# Patient Record
Sex: Female | Born: 1950 | Hispanic: No | Marital: Married | Smoking: Never smoker
Health system: Southern US, Community
[De-identification: ages and names within clinical notes are randomized; demographics above are authoritative.]

## PROBLEM LIST (undated history)

## (undated) DIAGNOSIS — K219 Gastro-esophageal reflux disease without esophagitis: Secondary | ICD-10-CM

## (undated) DIAGNOSIS — I10 Essential (primary) hypertension: Secondary | ICD-10-CM

## (undated) DIAGNOSIS — E785 Hyperlipidemia, unspecified: Secondary | ICD-10-CM

## (undated) DIAGNOSIS — M1990 Unspecified osteoarthritis, unspecified site: Secondary | ICD-10-CM

## (undated) DIAGNOSIS — E119 Type 2 diabetes mellitus without complications: Secondary | ICD-10-CM

## (undated) DIAGNOSIS — N189 Chronic kidney disease, unspecified: Secondary | ICD-10-CM

## (undated) DIAGNOSIS — T7840XA Allergy, unspecified, initial encounter: Secondary | ICD-10-CM

## (undated) HISTORY — PX: COLONOSCOPY: SHX174

## (undated) HISTORY — DX: Unspecified osteoarthritis, unspecified site: M19.90

## (undated) HISTORY — PX: MEDIAL PARTIAL KNEE REPLACEMENT: SHX5965

## (undated) HISTORY — DX: Allergy, unspecified, initial encounter: T78.40XA

## (undated) HISTORY — PX: TRIGGER FINGER RELEASE: SHX641

## (undated) HISTORY — PX: APPENDECTOMY: SHX54

## (undated) HISTORY — DX: Gastro-esophageal reflux disease without esophagitis: K21.9

## (undated) HISTORY — DX: Chronic kidney disease, unspecified: N18.9

---

## 2014-10-02 ENCOUNTER — Emergency Department (HOSPITAL_COMMUNITY): Payer: BC Managed Care – PPO

## 2014-10-02 ENCOUNTER — Encounter (HOSPITAL_COMMUNITY): Payer: Self-pay | Admitting: Emergency Medicine

## 2014-10-02 ENCOUNTER — Emergency Department (HOSPITAL_COMMUNITY)
Admission: EM | Admit: 2014-10-02 | Discharge: 2014-10-02 | Disposition: A | Discharge status: Home / Self Care | Payer: BC Managed Care – PPO | Attending: Emergency Medicine | Admitting: Emergency Medicine

## 2014-10-02 DIAGNOSIS — X58XXXA Exposure to other specified factors, initial encounter: Secondary | ICD-10-CM | POA: Diagnosis not present

## 2014-10-02 DIAGNOSIS — I10 Essential (primary) hypertension: Secondary | ICD-10-CM | POA: Insufficient documentation

## 2014-10-02 DIAGNOSIS — M25562 Pain in left knee: Secondary | ICD-10-CM

## 2014-10-02 DIAGNOSIS — Y93K1 Activity, walking an animal: Secondary | ICD-10-CM | POA: Diagnosis not present

## 2014-10-02 DIAGNOSIS — M7122 Synovial cyst of popliteal space [Baker], left knee: Secondary | ICD-10-CM | POA: Diagnosis not present

## 2014-10-02 DIAGNOSIS — R52 Pain, unspecified: Secondary | ICD-10-CM

## 2014-10-02 DIAGNOSIS — S8992XA Unspecified injury of left lower leg, initial encounter: Secondary | ICD-10-CM | POA: Insufficient documentation

## 2014-10-02 DIAGNOSIS — Y929 Unspecified place or not applicable: Secondary | ICD-10-CM | POA: Diagnosis not present

## 2014-10-02 DIAGNOSIS — E119 Type 2 diabetes mellitus without complications: Secondary | ICD-10-CM | POA: Insufficient documentation

## 2014-10-02 HISTORY — DX: Essential (primary) hypertension: I10

## 2014-10-02 HISTORY — DX: Hyperlipidemia, unspecified: E78.5

## 2014-10-02 HISTORY — DX: Type 2 diabetes mellitus without complications: E11.9

## 2014-10-02 MED ORDER — ONDANSETRON 4 MG PO TBDP: 4.0000 mg | ORAL_TABLET | Freq: Once | ORAL | Status: DC

## 2014-10-02 MED ORDER — ONDANSETRON HCL 4 MG PO TABS: 4.0000 mg | ORAL_TABLET | Freq: Four times a day (QID) | ORAL | Status: DC

## 2014-10-02 MED ORDER — HYDROCODONE-ACETAMINOPHEN 5-325 MG PO TABS: 1.0000 | ORAL_TABLET | Freq: Four times a day (QID) | ORAL | Status: DC | PRN

## 2014-10-02 NOTE — ED Notes (Signed)
PT given an RX for cane

## 2014-10-02 NOTE — ED Provider Notes (Signed)
CSN: [id number]     Arrival date & time 10/02/14  1052 History  This chart was scribed for a non-physician practitioner, Jinny SandersJoseph Aadarsh Cozort, PA-C working with Linwood DibblesJon Knapp, MD by [place] Peace, ED Scribe. The patient was seen in TR07C/TR07C. The patient's care was started at 12:55 PM.    Chief Complaint  Patient presents with  . Leg Pain      Patient is a 63 y.o. female presenting with leg pain. The history is provided by the patient. No language interpreter was used.  Leg Pain Associated symptoms: no fever   HPI Comments: Wanda Medina is a 63 y.o. female who presents to the Emergency Department complaining of left leg pain behind left knee since last week. Pt reports she is experiencing pain when she puts pressure on affected leg. She states that she was walking her dog last night when she felt a "pop" and pain has progressively worsened since. She denies any injury that could be responsible. Daughters state that pt usually wears a knee brace on left knee just for precaution due to her being a frequent walker but did not wear one yesterday. Pt has history of osteoarthritis in both knees and is followed by orthopedics for same. Pt reports history of DM.   Past Medical History  Diagnosis Date  . Diabetes mellitus without complication   . Hypertension   . Hyperlipemia    History reviewed. No pertinent past surgical history. History reviewed. No pertinent family history. History  Substance Use Topics  . Smoking status: Never Smoker   . Smokeless tobacco: Not on file  . Alcohol Use: No   OB History   Grav Para Term Preterm Abortions TAB SAB Ect Mult Living                 Review of Systems  Constitutional: Negative for fever.  Gastrointestinal: Negative for nausea and vomiting.  Musculoskeletal: Positive for arthralgias.       Left knee pain.   Neurological: Negative for weakness and numbness.      Allergies  Review of patient's allergies indicates no known allergies.  Home  Medications   Prior to Admission medications   Medication Sig Start Date End Date Taking? Authorizing Provider  HYDROcodone-acetaminophen (NORCO/VICODIN) 5-325 MG per tablet Take 1-2 tablets by mouth every 6 (six) hours as needed for moderate pain or severe pain. 10/02/14   Monte FantasiaJoseph W Karely Hurtado, PA-C  ondansetron (ZOFRAN) 4 MG tablet Take 1 tablet (4 mg total) by mouth every 6 (six) hours. 10/02/14   Monte FantasiaJoseph W Xyla Leisner, PA-C   BP 145/85  Pulse 75  Temp(Src) 98.1 F (36.7 C) (Oral)  Resp 16  Ht 5' (1.524 m)  Wt 135 lb (61.236 kg)  BMI 26.37 kg/m2  SpO2 100% Physical Exam  Nursing note and vitals reviewed. Constitutional: She is oriented to person, place, and time. She appears well-developed and well-nourished. No distress.  HENT:  Head: Normocephalic and atraumatic.  Eyes: Conjunctivae and EOM are normal.  Neck: Neck supple. No tracheal deviation present.  Cardiovascular: Normal rate.   Pulmonary/Chest: Effort normal. No respiratory distress.  Musculoskeletal: Normal range of motion.  Limited range of motion of left knee due to pain. No obvious erythema, warmth, deformity, ecchymosis, signs of injury or infection. Mild effusion of left knee. No anterior, posterior, or lateral instability. Positive McMurray's sign with varus manipulation.moderate amount of soft, fluctuant, slightly reducible cystic mass in patient's posterior left knee.   Neurological: She is alert and oriented to person, place,  and time. She has normal strength. No sensory deficit. She displays a negative Romberg sign. GCS eye subscore is 4. GCS verbal subscore is 5. GCS motor subscore is 6.  5 out of 5 motor strength noted in all major muscle groups of upper and lower extremities. Distal sensation intact.  Skin: Skin is warm and dry.  Psychiatric: She has a normal mood and affect. Her behavior is normal.    ED Course  Procedures (including critical care time) Labs Review Labs Reviewed - No data to display  No results  found for this or any previous visit. No results found.   Imaging Review Dg Knee Complete 4 Views Left  10/02/2014   CLINICAL DATA:  Patient complaining of left knee pain in the posterior knee for 4 days, worsening today. Pain limits patient's ability to walk. No known injury. No history of surgery.  EXAM: LEFT KNEE - COMPLETE 4+ VIEW  COMPARISON:  None.  FINDINGS: No fracture. Knee joint is normally aligned. There is minor marginal spurring from medial compartment. Small marginal spurs are noted from the patella.  There is a small irregular ossified or calcified density the posterior aspect of the knee below the level of a fabella. This is consistent with an intra-articular body.  No convincing joint effusion. Soft tissues are otherwise unremarkable.  IMPRESSION: 1. No fracture or acute finding. 2. Small posterior intra-articular ossified or calcified body. 3. Mild degenerative changes of the medial and patellofemoral joint space compartments.   Electronically Signed   By: Amie Portlandavid  Ormond M.D.   On: 10/02/2014 12:09     EKG Interpretation None     Medications - No data to display  1:00 PM- Treatment plan was discussed with patient who verbalizes understanding and agrees.   MDM   Final diagnoses:  Left knee pain  Baker cyst, left    63 year old female with history of osteoarthritis in her knees here complaining of knee pain since last week.based on patient's PE and history, her pain is consistent with possible osteoarthritis of her knees.  Patient and her family state the patient does have osteoarthritis in her knees and has been followed by orthopedics for same before.  Radiographs today consistent with some mild degenerative disease.   Exam today is consistent with exacerbation of osteoarthritic pain, no obvious injuries, although patient other than a positive McMurray sign. I believe this may be due to joint line irritation/pain which may contribute to this. I am not as concerned about  a meniscal tear, however as it is a possibility. I believe the cystic mass in the patient's posterior knee is most likely a Baker's cyst. I considered placing patient in a knee immobilizer, however due to her age and her current physical condition, she and I agreed that it would be extremely difficult for her to use crutches feasibly. I therefore encouraged her to continue using her knee sleeve, and I prescribed a 4 pronged cane for her to help get weight off of her knee. I strongly encouraged patient to follow-up with Dr. Lynnae SandhoffHammen who [place]. I sent patient home with symptomatic therapy, and discussed return precautions with her and her family. Patient was agreeable to this plan. I encouraged patient to call or return to the ER should she have any questions or concerns.  BP 145/85  Pulse 75  Temp(Src) 98.1 F (36.7 C) (Oral)  Resp 16  Ht 5' (1.524 m)  Wt 135 lb (61.236 kg)  BMI 26.37 kg/m2  SpO2 100%  Signed,  Ladona Mow, PA-C 6:04 PM   I personally performed the services described in this documentation, which was scribed in my presence. The recorded information has been reviewed and is accurate.   Monte Fantasia, PA-C 10/02/14 1804  Monte Fantasia, PA-C 10/02/14 [phone]

## 2014-10-02 NOTE — ED Notes (Signed)
At time of D/C Pt . And family requested An RX for nausea meds.

## 2014-10-02 NOTE — ED Notes (Signed)
She reports pain behind L knee since last week. Denies any injuries. Last night while she was walking she heard a "pop" from the knee and the pain has been worse since. Cms intact.

## 2014-10-02 NOTE — Discharge Instructions (Signed)
Follow-up with Dr. Ronney Asters with orthopedics. Return to the ER if you develop any weakness, numbness, tingling, worsening pain or should you have any questions or concerns.  Arthritis, Nonspecific Arthritis is inflammation of a joint. This usually means pain, redness, warmth or swelling are present. One or more joints may be involved. There are a number of types of arthritis. Your caregiver may not be able to tell what type of arthritis you have right away. CAUSES  The most common cause of arthritis is the wear and tear on the joint (osteoarthritis). This causes damage to the cartilage, which can break down over time. The knees, hips, back and neck are most often affected by this type of arthritis. Other types of arthritis and common causes of joint pain include:  Sprains and other injuries near the joint. Sometimes minor sprains and injuries cause pain and swelling that develop hours later.  Rheumatoid arthritis. This affects hands, feet and knees. It usually affects both sides of your body at the same time. It is often associated with chronic ailments, fever, weight loss and general weakness.  Crystal arthritis. Gout and pseudo gout can cause occasional acute severe pain, redness and swelling in the foot, ankle, or knee.  Infectious arthritis. Bacteria can get into a joint through a break in overlying skin. This can cause infection of the joint. Bacteria and viruses can also spread through the blood and affect your joints.  Drug, infectious and allergy reactions. Sometimes joints can become mildly painful and slightly swollen with these types of illnesses. SYMPTOMS   Pain is the main symptom.  Your joint or joints can also be red, swollen and warm or hot to the touch.  You may have a fever with certain types of arthritis, or even feel overall ill.  The joint with arthritis will hurt with movement. Stiffness is present with some types of arthritis. DIAGNOSIS  Your caregiver will suspect  arthritis based on your description of your symptoms and on your exam. Testing may be needed to find the type of arthritis:  Blood and sometimes urine tests.  X-ray tests and sometimes CT or MRI scans.  Removal of fluid from the joint (arthrocentesis) is done to check for bacteria, crystals or other causes. Your caregiver (or a specialist) will numb the area over the joint with a local anesthetic, and use a needle to remove joint fluid for examination. This procedure is only minimally uncomfortable.  Even with these tests, your caregiver may not be able to tell what kind of arthritis you have. Consultation with a specialist (rheumatologist) may be helpful. TREATMENT  Your caregiver will discuss with you treatment specific to your type of arthritis. If the specific type cannot be determined, then the following general recommendations may apply. Treatment of severe joint pain includes:  Rest.  Elevation.  Anti-inflammatory medication (for example, ibuprofen) may be prescribed. Avoiding activities that cause increased pain.  Only take over-the-counter or prescription medicines for pain and discomfort as recommended by your caregiver.  Cold packs over an inflamed joint may be used for 10 to 15 minutes every hour. Hot packs sometimes feel better, but do not use overnight. Do not use hot packs if you are diabetic without your caregiver's permission.  A cortisone shot into arthritic joints may help reduce pain and swelling.  Any acute arthritis that gets worse over the next 1 to 2 days needs to be looked at to be sure there is no joint infection. Long-term arthritis treatment involves modifying activities and  lifestyle to reduce joint stress jarring. This can include weight loss. Also, exercise is needed to nourish the joint cartilage and remove waste. This helps keep the muscles around the joint strong. HOME CARE INSTRUCTIONS   Do not take aspirin to relieve pain if gout is suspected. This  elevates uric acid levels.  Only take over-the-counter or prescription medicines for pain, discomfort or fever as directed by your caregiver.  Rest the joint as much as possible.  If your joint is swollen, keep it elevated.  Use crutches if the painful joint is in your leg.  Drinking plenty of fluids may help for certain types of arthritis.  Follow your caregiver's dietary instructions.  Try low-impact exercise such as:  Swimming.  Water aerobics.  Biking.  Walking.  Morning stiffness is often relieved by a warm shower.  Put your joints through regular range-of-motion. SEEK MEDICAL CARE IF:   You do not feel better in 24 hours or are getting worse.  You have side effects to medications, or are not getting better with treatment. SEEK IMMEDIATE MEDICAL CARE IF:   You have a fever.  You develop severe joint pain, swelling or redness.  Many joints are involved and become painful and swollen.  There is severe back pain and/or leg weakness.  You have loss of bowel or bladder control. Document Released: 01/03/2005 Document Revised: 02/18/2012 Document Reviewed: 01/19/2009 [facility] Patient Information 2015 [place] [place]. This information is not intended to replace advice given to you by your health care provider. Make sure you discuss any questions you have with your health care provider.  Baker Cyst A Baker cyst is a sac-like structure that forms in the back of the knee. It is filled with the same fluid that is located in your knee. This fluid lubricates the bones and cartilage of the knee and allows them to move over each other more easily. CAUSES  When the knee becomes injured or inflamed, increased fluid forms in the knee. When this happens, the joint lining is pushed out behind the knee and forms the Baker cyst. This cyst may also be caused by inflammation from arthritic conditions and infections. SIGNS AND SYMPTOMS  A Baker cyst usually has no symptoms. When the  cyst is substantially enlarged:  You may feel pressure behind the knee, stiffness in the knee, or a mass in the area behind the knee.  You may develop pain, redness, and swelling in the calf. This can suggest a blood clot and requires evaluation by your health care provider. DIAGNOSIS  A Baker cyst is most often found during an ultrasound exam. This exam may have been performed for other reasons, and the cyst was found incidentally. Sometimes an MRI is used. This picks up other problems within a joint that an ultrasound exam may not. If the Baker cyst developed immediately after an injury, X-ray exams may be used to diagnose the cyst. TREATMENT  The treatment depends on the cause of the cyst. Anti-inflammatory medicines and rest often will be prescribed. If the cyst is caused by a bacterial infection, antibiotic medicines may be prescribed.  HOME CARE INSTRUCTIONS   If the cyst was caused by an injury, for the first 24 hours, keep the injured leg elevated on 2 pillows while lying down.  For the first 24 hours while you are awake, apply ice to the injured area:  Put ice in a plastic bag.  Place a towel between your skin and the bag.  Leave the ice on for  20 minutes, 2-3 times a day.  Only take over-the-counter or prescription medicines for pain, discomfort, or fever as directed by your health care provider.  Only take antibiotic medicine as directed. Make sure to finish it even if you start to feel better. MAKE SURE YOU:   Understand these instructions.  Will watch your condition.  Will get help right away if you are not doing well or get worse. Document Released: 11/26/2005 Document Revised: 09/16/2013 Document Reviewed: 07/08/2013 [facility] Patient Information 2015 [place], [place]. This information is not intended to replace advice given to you by your health care provider. Make sure you discuss any questions you have with your health care provider.

## 2014-10-05 NOTE — ED Provider Notes (Signed)
Medical screening examination/treatment/procedure(s) were performed by non-physician practitioner and as supervising physician I was immediately available for consultation/collaboration.    Bryla Burek, MD 10/05/14 1517 

## 2017-06-27 ENCOUNTER — Encounter: Payer: Self-pay | Admitting: Specialist

## 2017-07-16 ENCOUNTER — Encounter: Payer: Self-pay | Admitting: Gastroenterology

## 2017-09-02 ENCOUNTER — Ambulatory Visit (INDEPENDENT_AMBULATORY_CARE_PROVIDER_SITE_OTHER): Payer: BLUE CROSS/BLUE SHIELD | Admitting: Physician Assistant

## 2017-09-02 ENCOUNTER — Ambulatory Visit (AMBULATORY_SURGERY_CENTER): Payer: Self-pay | Admitting: *Deleted

## 2017-09-02 ENCOUNTER — Encounter: Payer: Self-pay | Admitting: Physician Assistant

## 2017-09-02 VITALS — Ht 61.0 in | Wt 138.2 lb

## 2017-09-02 VITALS — BP 110/72 | HR 82 | Ht 61.0 in | Wt 138.0 lb

## 2017-09-02 DIAGNOSIS — R1310 Dysphagia, unspecified: Secondary | ICD-10-CM | POA: Diagnosis not present

## 2017-09-02 DIAGNOSIS — K219 Gastro-esophageal reflux disease without esophagitis: Secondary | ICD-10-CM | POA: Diagnosis not present

## 2017-09-02 DIAGNOSIS — Z1211 Encounter for screening for malignant neoplasm of colon: Secondary | ICD-10-CM

## 2017-09-02 MED ORDER — METOCLOPRAMIDE HCL 5 MG PO TABS: 5.0000 mg | ORAL_TABLET | ORAL | 0 refills | Status: DC

## 2017-09-02 MED ORDER — NA SULFATE-K SULFATE-MG SULF 17.5-3.13-1.6 GM/177ML PO SOLN: 1.0000 [IU] | Freq: Once | ORAL | 0 refills | Status: AC

## 2017-09-02 NOTE — Progress Notes (Signed)
Inadvertently sent to me. Will route to Dr. Myrtie Neither

## 2017-09-02 NOTE — Patient Instructions (Signed)
Take your Reglan 5 mg tablet 30 minutes before drinking colonoscopy prep.

## 2017-09-02 NOTE — Progress Notes (Signed)
No egg or soy allergy known to patient  No issues with past sedation with any surgeries  or procedures, no intubation problems  No diet pills per patient No home 02 use per patient  No blood thinners per patient  Pt denies issues with constipation   On iron will need to hold for 5 days No A fib or A flutter  EMMI video sent to pt's e mail

## 2017-09-02 NOTE — Progress Notes (Signed)
Chief Complaint: GERD  HPI:  Wanda Medina is a 66 year old [place] female with a past medical history as listed below,  who was referred to me by Dairl Ponder, MD for a complaint of reflux.     Apparently, patient was being seen upstairs for her previsit for screening colonoscopy and discussed some symptoms of reflux. She was then given an appointment in clinic to discuss further.    Today, the patient presents to clinic accompanied by her daughter and explains that she has had reflux symptoms for 40 years. Initially, she tried over-the-counter Zantac, but this did not help, she then increased this to twice daily, but this did not help, she was then changed to Nexium 20 mg but this did not help, and recently she has been started on Omeprazole 20 mg daily, as well as Carafate. Patient did take Carafate 4 times a day initially for 3 months and now is taking as needed. Patient typically has to use Carafate 3 days per week as well as her Omeprazole 20 mg which she uses daily. Patient notes two specific episodes where she felt as though something got stuck on its way down her throat and caused excruciating epigastric/chest pain. She tells me at those times it felt as though she was having a heart attack. This has not occurred for the past 3 months. She has had no trouble with swallowing liquids or foods over the past 3 months.   Patient does tell me her son is a Ambulance person and recommended that she have an EGD for further evaluation of her symptoms. Her daughter is a Software engineer who works at [employer] nearby. Her husband is a Teacher, music and her other daughter is a Chief Executive Officer.   Patient denies fever, chills, blood in her stool, melena, weight loss, fatigue, anorexia, abdominal pain, nausea, vomiting, change in bowel habits or symptoms that awaken her at night.      Past Medical History:  Diagnosis Date  . Allergy   . Arthritis   . Chronic kidney disease    AKI saw Nephrologist in [place]  .  Diabetes mellitus without complication ([place])   . GERD (gastroesophageal reflux disease)   . Hyperlipemia   . Hypertension     Past Surgical History:  Procedure Laterality Date  . COLONOSCOPY    . MEDIAL PARTIAL KNEE REPLACEMENT     Rt. knee in 05/2016  . TRIGGER FINGER RELEASE     both hands    Current Outpatient Prescriptions  Medication Sig Dispense Refill  . amLODipine (NORVASC) 5 MG tablet Take by mouth.    Marland Kitchen aspirin EC 81 MG tablet Take by mouth.    . ferrous sulfate 324 (65 Fe) MG TBEC Take by mouth.    Marland Kitchen glipiZIDE (GLUCOTROL) 5 MG tablet Take by mouth.    . linagliptin (TRADJENTA) 5 MG TABS tablet Take by mouth.    . losartan (COZAAR) 100 MG tablet Take by mouth.    . metFORMIN (GLUCOPHAGE-XR) 750 MG 24 hr tablet Take 750 mg by mouth 2 (two) times daily.    . mometasone (NASONEX) 50 MCG/ACT nasal spray Place into the nose.    . montelukast (SINGULAIR) 10 MG tablet Take by mouth.    . Na Sulfate-K Sulfate-Mg Sulf (SUPREP BOWEL PREP KIT) 17.5-3.13-1.6 GM/180ML SOLN Take 1 Units by mouth once. 1 Bottle 0  . omeprazole (PRILOSEC) 20 MG capsule Take by mouth.    . ONE TOUCH ULTRA TEST test strip     . simvastatin (  ZOCOR) 20 MG tablet Take by mouth.    . sucralfate (CARAFATE) 1 GM/10ML suspension Take by mouth.    . vitamin B-12 (CYANOCOBALAMIN) 500 MCG tablet Take 500 mcg by mouth daily.    Marland Kitchen zinc gluconate 50 MG tablet Take 50 mg by mouth daily.     No current facility-administered medications for this visit.     Allergies as of 09/02/2017  . (No Known Allergies)    Family History  Problem Relation Age of Onset  . Colon cancer Neg Hx   . Colon polyps Neg Hx   . Esophageal cancer Neg Hx   . Rectal cancer Neg Hx   . Stomach cancer Neg Hx     Social History   Social History  . Marital status: Married    Spouse name: N/A  . Number of children: N/A  . Years of education: N/A   Occupational History  . Not on file.   Social History Main Topics  . Smoking  status: Never Smoker  . Smokeless tobacco: Never Used  . Alcohol use No  . Drug use: No  . Sexual activity: Not on file   Other Topics Concern  . Not on file   Social History Narrative  . No narrative on file    Review of Systems:    Constitutional: No weight loss, fever or chills Skin: No rash  Cardiovascular: No chest pain Respiratory: No SOB  Gastrointestinal: See HPI and otherwise negative Genitourinary: No dysuria Neurological: No headache Musculoskeletal: No new muscle or joint pain Hematologic: No bleeding Psychiatric: No history of depression or anxiety    Physical Exam:  Vital signs: BP 110/72   Pulse 82   Ht '5\' 1"'  (1.549 m)   Wt 138 lb (62.6 kg)   BMI 26.07 kg/m    Constitutional:   Very pleasant [place] female appears to be in NAD, Well developed, Well nourished, alert and cooperative Head:  Normocephalic and atraumatic. Eyes:   PEERL, EOMI. No icterus. Conjunctiva pink. Ears:  Normal auditory acuity. Neck:  Supple Throat: Oral cavity and pharynx without inflammation, swelling or lesion.  Respiratory: Respirations even and unlabored. Lungs clear to auscultation bilaterally.   No wheezes, crackles, or rhonchi.  Cardiovascular: Normal S1, S2. No MRG. Regular rate and rhythm. No peripheral edema, cyanosis or pallor.  Gastrointestinal:  Soft, nondistended, nontender. No rebound or guarding. Normal bowel sounds. No appreciable masses or hepatomegaly. Rectal:  Not performed.  Msk:  Symmetrical without gross deformities. Without edema, no deformity or joint abnormality.  Neurologic:  Alert and  oriented x4;  grossly normal neurologically.  Skin:   Dry and intact without significant lesions or rashes. Psychiatric: Demonstrates good judgement and reason without abnormal affect or behaviors.  No recent labs or imaging.  Assessment: 1. GERD: 40+ years currently controlled on Omeprazole 20 mg daily and when necessary Carafate 2. Dysphagia: 2 distinct episodes, not  for the past 3 months; consider esophagitis or stricture versus ring versus web 3. Screening for colorectal cancer: She has never had a colonoscopy and is due now  Plan: 1. Patient is already scheduled for colonoscopy in [place]. We will add EGD with possible dilation. Discussed risks, benefits, limitations and alternatives and patient agrees to proceed. 2. Patient to continue her Omeprazole 20 mg daily and when necessary Carafate for now. May discuss higher doses or various other medications after EGD results. 3. Reviewed anti-dysphagia measures including taking small bites, drinking sips of water in between bites, avoiding distraction while eating,  chewing well and the chin tuck technique 4. Patient did request some Reglan prior to dosing for her colonoscopy. Did prescribe Reglan 5 mg, 1 tab by mouth 20 to 30 minutes before the first dose of bowel prep and another before the second dose #2 tablets 5. Patient to follow in clinic as needed per recommendations from Dr. Loletha Carrow after time of procedures  Ellouise Newer, PA-C [facility] 09/02/2017, 2:06 PM  Cc: Dairl Ponder, MD

## 2017-09-03 NOTE — Progress Notes (Signed)
Thank you for sending this case to me. I have reviewed the entire note, and the outlined plan seems appropriate.   Dreamer Carillo Danis, MD  

## 2017-09-03 NOTE — Progress Notes (Signed)
Thank you for sending this case to me. I have reviewed the entire note, and the outlined plan seems appropriate.   Tylena Prisk Danis, MD  

## 2017-09-11 ENCOUNTER — Telehealth: Payer: Self-pay | Admitting: Gastroenterology

## 2017-09-11 NOTE — Telephone Encounter (Signed)
Patient is having a regular dental cleaning, no infections, takes amoxicillin prophylactic just on the day of her cleaning. Let her know that it would be fine to continue with procedure scheduled on 09/16/17.

## 2017-09-16 ENCOUNTER — Encounter: Payer: Self-pay | Admitting: Gastroenterology

## 2017-09-16 ENCOUNTER — Ambulatory Visit (AMBULATORY_SURGERY_CENTER): Payer: BLUE CROSS/BLUE SHIELD | Admitting: Gastroenterology

## 2017-09-16 VITALS — BP 110/52 | HR 82 | Temp 96.8°F | Resp 18 | Ht 61.0 in | Wt 138.0 lb

## 2017-09-16 DIAGNOSIS — R1310 Dysphagia, unspecified: Secondary | ICD-10-CM | POA: Diagnosis present

## 2017-09-16 DIAGNOSIS — Z1211 Encounter for screening for malignant neoplasm of colon: Secondary | ICD-10-CM | POA: Diagnosis not present

## 2017-09-16 DIAGNOSIS — K219 Gastro-esophageal reflux disease without esophagitis: Secondary | ICD-10-CM

## 2017-09-16 MED ORDER — SODIUM CHLORIDE 0.9 % IV SOLN: 500.0000 mL | INTRAVENOUS | Status: DC

## 2017-09-16 NOTE — Progress Notes (Signed)
Report given to PACU, vss 

## 2017-09-16 NOTE — Op Note (Signed)
[facility] Patient Name: Wanda Medina Procedure Date: 09/16/2017 11:02 AM MRN: [id number] Endoscopist: Sherilyn Cooter L. Myrtie Neither , MD Age: 66 Referring MD:  Date of Birth: [birth year] Gender: Female Account #: [id number] Procedure:                Upper GI endoscopy Indications:              Dysphagia, Heartburn Medicines:                Monitored Anesthesia Care Procedure:                Pre-Anesthesia Assessment:                           - Prior to the procedure, a History and Physical                            was performed, and patient medications and                            allergies were reviewed. The patient's tolerance of                            previous anesthesia was also reviewed. The risks                            and benefits of the procedure and the sedation                            options and risks were discussed with the patient.                            All questions were answered, and informed consent                            was obtained. Anticoagulants: The patient has taken                            aspirin. It was decided not to withhold this                            medication prior to the procedure. ASA Grade                            Assessment: II - A patient with mild systemic                            disease. After reviewing the risks and benefits,                            the patient was deemed in satisfactory condition to                            undergo the procedure.  After obtaining informed consent, the endoscope was                            passed under direct vision. Throughout the                            procedure, the patient's blood pressure, pulse, and                            oxygen saturations were monitored continuously. The                            Model GIF-HQ190 [id number]) scope was introduced                            through the mouth, and advanced to the second part                   of duodenum. The upper GI endoscopy was                            accomplished without difficulty. The patient                            tolerated the procedure well. Scope In: Scope Out: Findings:                 The larynx was normal.                           The lower third of the esophagus was moderately                            tortuous.                           A 3 cm hiatal hernia was present.                           The stomach was normal.                           The cardia and gastric fundus were normal on                            retroflexion.                           The examined duodenum was normal. Complications:            No immediate complications. Estimated Blood Loss:     Estimated blood loss: none. Impression:               - Normal larynx.                           - Tortuous esophagus.                           -  3 cm hiatal hernia.                           - Normal stomach.                           - Normal examined duodenum.                           - No specimens collected.                           No ring or stricture was seen - no dilation                            performed.                           The dysphagia appears to be from distal esophageal                            tortuosity and possible reflux-induced dysmotility. Recommendation:           - Patient has a contact number available for                            emergencies. The signs and symptoms of potential                            delayed complications were discussed with the                            patient. Return to normal activities tomorrow.                            Written discharge instructions were provided to the                            patient.                           - Resume previous diet.                           - Continue present medications.                           - See the other procedure note for documentation of                             additional recommendations. Narcissus Detwiler L. Myrtie Neither, MD 09/16/2017 11:42:30 AM This report has been signed electronically.

## 2017-09-16 NOTE — Op Note (Signed)
[facility] Patient Name: Wanda Medina Procedure Date: 09/16/2017 11:02 AM MRN: [id number] Endoscopist: Sherilyn Cooter L. Myrtie Neither , MD Age: 66 Referring MD:  Date of Birth: 17-Jan-1951 Gender: Female Account #: [id number] Procedure:                Colonoscopy Indications:              Screening for colorectal malignant neoplasm                            (patient reports no polyps on a 2006 colonoscopy) Medicines:                Monitored Anesthesia Care Procedure:                Pre-Anesthesia Assessment:                           - Prior to the procedure, a History and Physical                            was performed, and patient medications and                            allergies were reviewed. The patient's tolerance of                            previous anesthesia was also reviewed. The risks                            and benefits of the procedure and the sedation                            options and risks were discussed with the patient.                            All questions were answered, and informed consent                            was obtained. Anticoagulants: The patient has taken                            aspirin. It was decided not to withhold this                            medication prior to the procedure. ASA Grade                            Assessment: II - A patient with mild systemic                            disease. After reviewing the risks and benefits,                            the patient was deemed in satisfactory condition to  undergo the procedure.                           After obtaining informed consent, the colonoscope                            was passed under direct vision. Throughout the                            procedure, the patient's blood pressure, pulse, and                            oxygen saturations were monitored continuously. The                            Colonoscope was introduced through the anus  and                            advanced to the the cecum, identified by                            appendiceal orifice and ileocecal valve. The                            colonoscopy was performed without difficulty. The                            patient tolerated the procedure well. The quality                            of the bowel preparation was excellent. The                            ileocecal valve, appendiceal orifice, and rectum                            were photographed. The quality of the bowel                            preparation was evaluated using the BBPS [facility]                            Bowel Preparation Scale) with scores of: Right                            Colon = 3, Transverse Colon = 3 and Left Colon = 3                            (entire mucosa seen well with no residual staining,                            small fragments of stool or opaque liquid). The  total BBPS score equals 9. The bowel preparation                            used was SUPREP. Scope In: 11:23:36 AM Scope Out: 11:33:56 AM Scope Withdrawal Time: 0 hours 8 minutes 14 seconds  Total Procedure Duration: 0 hours 10 minutes 20 seconds  Findings:                 The digital rectal exam findings include decreased                            sphincter tone.                           A few small-mouthed diverticula were found in the                            left colon and right colon.                           The exam was otherwise without abnormality on                            direct and retroflexion views. Complications:            No immediate complications. Estimated Blood Loss:     Estimated blood loss: none. Impression:               - Decreased sphincter tone found on digital rectal                            exam.                           - Diverticulosis in the left colon and in the right                            colon.                           - The  examination was otherwise normal on direct                            and retroflexion views.                           - No specimens collected. Recommendation:           - Patient has a contact number available for                            emergencies. The signs and symptoms of potential                            delayed complications were discussed with the                            patient. Return  to normal activities tomorrow.                            Written discharge instructions were provided to the                            patient.                           - Resume previous diet.                           - Continue present medications.                           - Repeat colonoscopy in 10 years for screening                            purposes. Henry L. Myrtie Neither, MD 09/16/2017 11:44:58 AM This report has been signed electronically.

## 2017-09-16 NOTE — Patient Instructions (Signed)
Discharge instructions given. Handouts on hiatal hernia and diverticulosis. Resume previous medications. YOU HAD AN ENDOSCOPIC PROCEDURE TODAY AT THE [facility]:   Refer to the procedure report that was given to you for any specific questions about what was found during the examination.  If the procedure report does not answer your questions, please call your gastroenterologist to clarify.  If you requested that your care partner not be given the details of your procedure findings, then the procedure report has been included in a sealed envelope for you to review at your convenience later.  YOU SHOULD EXPECT: Some feelings of bloating in the abdomen. Passage of more gas than usual.  Walking can help get rid of the air that was put into your GI tract during the procedure and reduce the bloating. If you had a lower endoscopy (such as a colonoscopy or flexible sigmoidoscopy) you may notice spotting of blood in your stool or on the toilet paper. If you underwent a bowel prep for your procedure, you may not have a normal bowel movement for a few days.  Please Note:  You might notice some irritation and congestion in your nose or some drainage.  This is from the oxygen used during your procedure.  There is no need for concern and it should clear up in a day or so.  SYMPTOMS TO REPORT IMMEDIATELY:   Following lower endoscopy (colonoscopy or flexible sigmoidoscopy):  Excessive amounts of blood in the stool  Significant tenderness or worsening of abdominal pains  Swelling of the abdomen that is new, acute  Fever of 100F or higher   Following upper endoscopy (EGD)  Vomiting of blood or coffee ground material  New chest pain or pain under the shoulder blades  Painful or persistently difficult swallowing  New shortness of breath  Fever of 100F or higher  Black, tarry-looking stools  For urgent or emergent issues, a gastroenterologist can be reached at any hour by calling [phone]  [phone].   DIET:  We do recommend a small meal at first, but then you may proceed to your regular diet.  Drink plenty of fluids but you should avoid alcoholic beverages for 24 hours.  ACTIVITY:  You should plan to take it easy for the rest of today and you should NOT DRIVE or use heavy machinery until tomorrow (because of the sedation medicines used during the test).    FOLLOW UP: Our staff will call the number listed on your records the next business day following your procedure to check on you and address any questions or concerns that you may have regarding the information given to you following your procedure. If we do not reach you, we will leave a message.  However, if you are feeling well and you are not experiencing any problems, there is no need to return our call.  We will assume that you have returned to your regular daily activities without incident.  If any biopsies were taken you will be contacted by phone or by letter within the next 1-3 weeks.  Please call us at [phone] if you have not heard about the biopsies in 3 weeks.    SIGNATURES/CONFIDENTIALITY: You and/or your care partner have signed paperwork which will be entered into your electronic medical record.  These signatures attest to the fact that that the information above on your After Visit Summary has been reviewed and is understood.  Full responsibility of the confidentiality of this discharge information lies with you and/or your care-partner.

## 2017-09-17 ENCOUNTER — Telehealth: Payer: Self-pay | Admitting: *Deleted

## 2017-09-17 NOTE — Telephone Encounter (Signed)
  Follow up Call-  Call back number 09/16/2017  Post procedure Call Back phone  # [phone]  Permission to leave phone message Yes  Some recent data might be hidden     Patient questions:  Do you have a fever, pain , or abdominal swelling? No. Pain Score  0 *  Have you tolerated food without any problems? Yes.    Have you been able to return to your normal activities? Yes.    Do you have any questions about your discharge instructions: Diet   No. Medications  No. Follow up visit  No.  Do you have questions or concerns about your Care? No.  Actions: * If pain score is 4 or above: No action needed, pain <4.

## 2017-10-25 LAB — HM DIABETES EYE EXAM

## 2018-06-02 LAB — HM DIABETES EYE EXAM

## 2018-06-04 LAB — HM PAP SMEAR

## 2018-06-04 LAB — HM MAMMOGRAPHY

## 2018-10-27 ENCOUNTER — Ambulatory Visit (INDEPENDENT_AMBULATORY_CARE_PROVIDER_SITE_OTHER): Payer: BLUE CROSS/BLUE SHIELD | Admitting: Family Medicine

## 2018-10-27 ENCOUNTER — Encounter: Payer: Self-pay | Admitting: Family Medicine

## 2018-10-27 VITALS — BP 116/82 | HR 92 | Temp 97.6°F | Ht 61.0 in | Wt 130.2 lb

## 2018-10-27 DIAGNOSIS — Z1159 Encounter for screening for other viral diseases: Secondary | ICD-10-CM | POA: Diagnosis not present

## 2018-10-27 DIAGNOSIS — E119 Type 2 diabetes mellitus without complications: Secondary | ICD-10-CM | POA: Diagnosis not present

## 2018-10-27 DIAGNOSIS — I10 Essential (primary) hypertension: Secondary | ICD-10-CM

## 2018-10-27 DIAGNOSIS — M791 Myalgia, unspecified site: Secondary | ICD-10-CM | POA: Diagnosis not present

## 2018-10-27 DIAGNOSIS — Z0000 Encounter for general adult medical examination without abnormal findings: Secondary | ICD-10-CM | POA: Diagnosis not present

## 2018-10-27 DIAGNOSIS — J309 Allergic rhinitis, unspecified: Secondary | ICD-10-CM | POA: Insufficient documentation

## 2018-10-27 DIAGNOSIS — M1990 Unspecified osteoarthritis, unspecified site: Secondary | ICD-10-CM | POA: Insufficient documentation

## 2018-10-27 DIAGNOSIS — N189 Chronic kidney disease, unspecified: Secondary | ICD-10-CM | POA: Insufficient documentation

## 2018-10-27 DIAGNOSIS — Z23 Encounter for immunization: Secondary | ICD-10-CM

## 2018-10-27 DIAGNOSIS — E785 Hyperlipidemia, unspecified: Secondary | ICD-10-CM | POA: Insufficient documentation

## 2018-10-27 DIAGNOSIS — E782 Mixed hyperlipidemia: Secondary | ICD-10-CM

## 2018-10-27 DIAGNOSIS — I152 Hypertension secondary to endocrine disorders: Secondary | ICD-10-CM | POA: Insufficient documentation

## 2018-10-27 DIAGNOSIS — N184 Chronic kidney disease, stage 4 (severe): Secondary | ICD-10-CM | POA: Insufficient documentation

## 2018-10-27 DIAGNOSIS — K219 Gastro-esophageal reflux disease without esophagitis: Secondary | ICD-10-CM | POA: Insufficient documentation

## 2018-10-27 LAB — TSH: TSH: 1.74 u[IU]/mL (ref 0.35–4.50)

## 2018-10-27 LAB — CBC WITH DIFFERENTIAL/PLATELET
BASOS ABS: 0 10*3/uL (ref 0.0–0.1)
Basophils Relative: 0.6 % (ref 0.0–3.0)
EOS ABS: 0.4 10*3/uL (ref 0.0–0.7)
Eosinophils Relative: 6.8 % — ABNORMAL HIGH (ref 0.0–5.0)
HEMATOCRIT: 39.8 % (ref 36.0–46.0)
Hemoglobin: 13.3 g/dL (ref 12.0–15.0)
LYMPHS PCT: 24.6 % (ref 12.0–46.0)
Lymphs Abs: 1.5 10*3/uL (ref 0.7–4.0)
MCHC: 33.4 g/dL (ref 30.0–36.0)
MCV: 91.9 fl (ref 78.0–100.0)
MONOS PCT: 8.6 % (ref 3.0–12.0)
Monocytes Absolute: 0.5 10*3/uL (ref 0.1–1.0)
NEUTROS ABS: 3.6 10*3/uL (ref 1.4–7.7)
NEUTROS PCT: 59.4 % (ref 43.0–77.0)
PLATELETS: 251 10*3/uL (ref 150.0–400.0)
RBC: 4.33 Mil/uL (ref 3.87–5.11)
RDW: 12.7 % (ref 11.5–15.5)
WBC: 6 10*3/uL (ref 4.0–10.5)

## 2018-10-27 LAB — LIPID PANEL
Cholesterol: 167 mg/dL (ref 0–200)
HDL: 51.4 mg/dL (ref >39.00)
LDL Cholesterol: 91 mg/dL (ref 0–99)
NONHDL: 115.39
TRIGLYCERIDES: 120 mg/dL (ref 0.0–149.0)
Total CHOL/HDL Ratio: 3
VLDL: 24 mg/dL (ref 0.0–40.0)

## 2018-10-27 LAB — COMPREHENSIVE METABOLIC PANEL
ALBUMIN: 4.3 g/dL (ref 3.5–5.2)
ALK PHOS: 75 U/L (ref 39–117)
ALT: 19 U/L (ref 0–35)
AST: 22 U/L (ref 0–37)
BILIRUBIN TOTAL: 0.3 mg/dL (ref 0.2–1.2)
BUN: 18 mg/dL (ref 6–23)
CALCIUM: 10 mg/dL (ref 8.4–10.5)
CO2: 28 meq/L (ref 19–32)
CREATININE: 1.3 mg/dL — AB (ref 0.40–1.20)
Chloride: 103 mEq/L (ref 96–112)
GFR: 43.22 mL/min — AB (ref >60.00)
Glucose, Bld: 97 mg/dL (ref 70–99)
Potassium: 4.5 mEq/L (ref 3.5–5.1)
Sodium: 141 mEq/L (ref 135–145)
TOTAL PROTEIN: 7.4 g/dL (ref 6.0–8.3)

## 2018-10-27 LAB — MICROALBUMIN / CREATININE URINE RATIO
Creatinine,U: 84.7 mg/dL
Microalb Creat Ratio: 18.2 mg/g (ref 0.0–30.0)
Microalb, Ur: 15.4 mg/dL — ABNORMAL HIGH (ref 0.0–1.9)

## 2018-10-27 LAB — CK: Total CK: 66 U/L (ref 7–177)

## 2018-10-27 NOTE — Patient Instructions (Signed)
We are going to stop your pravachol x 2 weeks to see if this helps your arm pain... Also checking a CK to see if any muscle issues from the pravachol. If pain resolves when we stop the medication we know it's the medication.   Checking labs today  Giving pneumonvax shot.   Will get records so we can update everything. See if your eye doctor can send your records so we can document your eye exam.

## 2018-10-27 NOTE — Progress Notes (Signed)
Phone: [phone]  Subjective:  Patient presents today for their  Medicare Exam. Also is establishing care and has hx of diabetes type 2, HTN, hyperlipidemia and GERD.   Hypertension: Here for follow up of hypertension.  Currently on losartan 100mg  and norvasc 10mg . Takes medication as prescribed and denies any side effects. Exercise includes walking. Weight has been stable. Denies any chest pain, headaches, shortness of breath, vision changes, swelling in lower extremities. Her previous PCP just increased her norvasc to 10mg .   Diabetes: Patient is here for follow up of type 2 diabetes. First diagnosed in 1998.  Currently on the following medications lantus 10mg , glipizide and  bydureon. Takes medications as prescribed. Last A1C was 8.2. Currently exercising and following diabetic diet. Sugars range from 62 to 115. Denies any hypoglycemic events. Denies any vision changes, nausea, vomiting, abdominal pain, ulcers/paraesthesia in feet, polyuria, polydipsia or polyphagia. Denies any chest pain, shortness of breath. Recently started on insulin about 2+ months ago.   Hyperlipidemia: on statin. +diabetic. History of pain in her muscles so they changed her to pravachol. They also started her on gabapentin. Pain only in her arms.   Preventive Screening-Counseling & Management  Advanced directives: No  Smoking Status: Never smoker Second Hand Smoking status: No smokers in home  Risk Factors Regular exercise: Walks her dogs daily Diet: well balanced-vegetarian  Fall Risk: None  No flowsheet data found. Opioid use history:  no long term opioids use  Cardiac risk factors:  advanced age (older than 13 for men, 63 for women) yes Hyperlipidemia yes No diabetes. yes Family History: neither   Depression Screen None. PHQ2 0  Depression screen PHQ 2/9 10/27/2018  Decreased Interest 0  Down, Depressed, Hopeless 0  PHQ - 2 Score 0    Activities of Daily Living Independent ADLs and IADLs    Hearing Difficulties:-patient declines  Cognitive Testing No reported trouble.    Normal 3 word recall 2/3  List the Names of Other Physician/Practitioners you currently use: - ortho: Dr. Chase Picket in [place]  - Dr. Alfredia Ferguson in [place] -eye doctor  - Immunization History  Administered Date(s) Administered  . Pneumococcal Polysaccharide-23 10/27/2018   Required Immunizations needed today pnuemovax 23  Screening tests- up to date Health Maintenance Due  Topic Date Due  . HEMOGLOBIN A1C  [birth year]  . FOOT EXAM  04/09/1960  . OPHTHALMOLOGY EXAM  04/09/1960  . DEXA SCAN  04/10/2015  . PNA vac Low Risk Adult (2 of 2 - PPSV23) 10/24/2017    Review of Systems  Constitutional: Negative for chills, fever and malaise/fatigue.  HENT: Negative for hearing loss and sore throat.   Eyes: Negative for blurred vision and double vision.  Respiratory: Negative for cough, shortness of breath and wheezing.   Cardiovascular: Negative for chest pain, palpitations and leg swelling.  Gastrointestinal: Negative for abdominal pain, blood in stool, nausea and vomiting.  Genitourinary: Negative for dysuria and hematuria.  Musculoskeletal: Positive for myalgias. Negative for falls.  Skin: Negative for rash.  Neurological: Negative for dizziness and weakness.  Psychiatric/Behavioral: Negative for memory loss and suicidal ideas. The patient is not nervous/anxious and does not have insomnia.      The following were reviewed and entered/updated in epic: Past Medical History:  Diagnosis Date  . Allergy   . Arthritis   . Chronic kidney disease    AKI saw Nephrologist in [place]  . Diabetes mellitus without complication (HCC)   . GERD (gastroesophageal reflux disease)   . Hyperlipemia   .  Hypertension    Patient Active Problem List   Diagnosis Date Noted  . Benign essential HTN 10/27/2018  . Diabetes mellitus without complication (HCC) 10/27/2018  . CKD (chronic kidney disease)  10/27/2018  . GERD (gastroesophageal reflux disease) 10/27/2018  . Hyperlipidemia 10/27/2018  . Arthritis 10/27/2018  . Allergic rhinitis 10/27/2018   Past Surgical History:  Procedure Laterality Date  . APPENDECTOMY    . COLONOSCOPY    . MEDIAL PARTIAL KNEE REPLACEMENT     Rt. knee in 05/2016  . TRIGGER FINGER RELEASE     both hands    Family History  Problem Relation Age of Onset  . Arthritis Mother   . COPD Mother   . Heart attack Mother   . COPD Father   . Heart attack Father   . Heart attack Brother   . Colon cancer Neg Hx   . Colon polyps Neg Hx   . Esophageal cancer Neg Hx   . Rectal cancer Neg Hx   . Stomach cancer Neg Hx     Medications- reviewed and updated Current Outpatient Medications  Medication Sig Dispense Refill  . aspirin EC 81 MG tablet Take by mouth.    . BD PEN NEEDLE NANO U/F 32G X 4 MM MISC USE 1 NEEDLE SUBCUTANEOUSLY EVERY NIGHT  0  . BYDUREON BCISE 2 MG/0.85ML AUIJ INJECT 2 MG SUBCUTANEOUSLY ONCE A WEEK  2  . ferrous sulfate 324 (65 Fe) MG TBEC Take by mouth.    . gabapentin (NEURONTIN) 100 MG capsule Take 100 mg by mouth 2 (two) times daily.  3  . glipiZIDE (GLUCOTROL) 5 MG tablet Take by mouth.    . losartan (COZAAR) 100 MG tablet Take by mouth.    . mometasone (NASONEX) 50 MCG/ACT nasal spray Place into the nose.    . montelukast (SINGULAIR) 10 MG tablet Take by mouth.    Marland Kitchen omeprazole (PRILOSEC) 20 MG capsule Take by mouth.    . ONE TOUCH ULTRA TEST test strip     . pravastatin (PRAVACHOL) 40 MG tablet TAKE 1 TABLET BY MOUTH EVERYDAY AT BEDTIME  1  . vitamin B-12 (CYANOCOBALAMIN) 500 MCG tablet Take 500 mcg by mouth daily.    Marland Kitchen zinc gluconate 50 MG tablet Take 50 mg by mouth daily.    Marland Kitchen amLODipine (NORVASC) 10 MG tablet Take 1 tablet (10 mg total) by mouth daily. 90 tablet 3  . Insulin Glargine (LANTUS SOLOSTAR) 100 UNIT/ML Solostar Pen Inject 10 Units into the skin at bedtime. 5 pen PRN   Current Facility-Administered Medications    Medication Dose Route Frequency Provider Last Rate Last Dose  . 0.9 %  sodium chloride infusion  500 mL Intravenous Continuous Danis, Andreas Blower, MD        Allergies-reviewed and updated No Known Allergies  Social History   Socioeconomic History  . Marital status: Married    Spouse name: Not on file  . Number of children: 3  . Years of education: Not on file  . Highest education level: Not on file  Occupational History  . Not on file  Social Needs  . Financial resource strain: Not on file  . Food insecurity:    Worry: Not on file    Inability: Not on file  . Transportation needs:    Medical: Not on file    Non-medical: Not on file  Tobacco Use  . Smoking status: Never Smoker  . Smokeless tobacco: Never Used  Substance and Sexual Activity  .  Alcohol use: No  . Drug use: No  . Sexual activity: Not Currently  Lifestyle  . Physical activity:    Days per week: Not on file    Minutes per session: Not on file  . Stress: Not on file  Relationships  . Social connections:    Talks on phone: Not on file    Gets together: Not on file    Attends religious service: Not on file    Active member of club or organization: Not on file    Attends meetings of clubs or organizations: Not on file    Relationship status: Not on file  Other Topics Concern  . Not on file  Social History Narrative  . Not on file    Objective: BP 116/82 (BP Location: Left Arm, Patient Position: Sitting, Cuff Size: Normal)   Pulse 92   Temp 97.6 F (36.4 C) (Oral)   Ht 5\' 1"  (1.549 m)   Wt 130 lb 3.2 oz (59.1 kg)   LMP  (LMP Unknown)   SpO2 97%   BMI 24.60 kg/m  Physical Exam  Constitutional: She is oriented to person, place, and time and well-developed, well-nourished, and in no distress.  HENT:  Head: Normocephalic and atraumatic.  Right Ear: External ear normal.  Left Ear: External ear normal.  Mouth/Throat: Oropharynx is clear and moist.  Tm pearly with light reflex bilaterally     Neck: Normal range of motion. Neck supple. No thyromegaly present.  Cardiovascular: Normal rate, regular rhythm, normal heart sounds and intact distal pulses.  Pulmonary/Chest: Effort normal and breath sounds normal.  Abdominal: Soft. Bowel sounds are normal. There is no tenderness. There is no rebound and no guarding.  Musculoskeletal:  TTP over bilateral upper arms. (muscles)   Lymphadenopathy:    She has no cervical adenopathy.  Neurological: She is alert and oriented to person, place, and time.  Skin: Skin is warm and dry.  Psychiatric: Affect normal.  Vitals reviewed.    Assessment/Plan:  annual Medicare exam completed- discussed recommended screenings anddocumented any personalized health advice and referrals for preventive counseling. See AVS as well which was given to patient.   Status of chronic or acute concerns   Future Appointments  Date Time Provider Department Center  04/27/2019  9:40 AM Orland MustardWolfe, Burney Calzadilla, MD [facility]   Return in about 6 months (around 04/27/2019).   Lab/Order associations: Encounter for Medicare annual wellness exam  Benign essential HTN Blood pressure is to goal. Continue current anti-hypertensive medications.  routine lab work will be done today. No refills needed today. Recommended routine exercise and healthy diet including DASH diet and mediterranean diet. Encouraged weight loss. F/u in 6 months.    Diabetes mellitus without complication (HCC) - Plan: Comprehensive metabolic panel, CBC with Differential/Platelet, TSH, Lipid panel, Microalbumin / creatinine urine ratio -checking labs so we can see what a1c is today. Requesting records and asked that she send her last eye exam. Pneumovax 23 today. We are going to do trial off statin to see if arm cramping goes away. Will change her to crestor if symptoms do not resolve. Doing well on insulin, recommended bedtime snack, may need to drop back on dosage as fastin blood sugars get lower.   Need for  vaccination against Streptococcus pneumoniae - Plan: Pneumococcal polysaccharide vaccine 23-valent greater than or equal to 2yo subcutaneous/IM  Encounter for hepatitis C screening test for low risk patient - Plan: Hepatitis C antibody  Muscle pain - Plan: CK -checking CK and will  do 2 week trial off of her statin medication to see if this relieves her symptoms. If not, will do trial of muscle relaxer as she doesn't really have symptoms have neuropathy on her history.   Mixed hyperlipidemia -trial off statin. If no improvement in symptoms will do trial of crestor and see how she does. Discussed if she can tolerate would like her on with hx of diabetes.   F/u in 3 months for diabetic appointment.  Requesting records from previous physician.   Return precautions advised. Orland Mustard, MD

## 2018-10-28 LAB — HEPATITIS C ANTIBODY
Hepatitis C Ab: NONREACTIVE
SIGNAL TO CUT-OFF: 0.03

## 2018-10-29 ENCOUNTER — Other Ambulatory Visit: Payer: Self-pay | Admitting: Family Medicine

## 2018-10-29 ENCOUNTER — Other Ambulatory Visit (INDEPENDENT_AMBULATORY_CARE_PROVIDER_SITE_OTHER): Payer: BLUE CROSS/BLUE SHIELD

## 2018-10-29 DIAGNOSIS — E119 Type 2 diabetes mellitus without complications: Secondary | ICD-10-CM | POA: Diagnosis not present

## 2018-10-29 LAB — HEMOGLOBIN A1C: Hgb A1c MFr Bld: 7.4 % — ABNORMAL HIGH (ref 4.6–6.5)

## 2018-10-31 ENCOUNTER — Other Ambulatory Visit: Payer: Self-pay | Admitting: Family Medicine

## 2018-10-31 DIAGNOSIS — E119 Type 2 diabetes mellitus without complications: Secondary | ICD-10-CM

## 2018-11-03 LAB — HM DIABETES EYE EXAM

## 2018-11-05 NOTE — Progress Notes (Signed)
Copy of labs from 11/18 visit mailed to patient's home address per her request.

## 2018-11-12 ENCOUNTER — Telehealth: Payer: Self-pay | Admitting: Family Medicine

## 2018-11-12 DIAGNOSIS — M179 Osteoarthritis of knee, unspecified: Secondary | ICD-10-CM

## 2018-11-12 DIAGNOSIS — M8580 Other specified disorders of bone density and structure, unspecified site: Secondary | ICD-10-CM

## 2018-11-12 DIAGNOSIS — E538 Deficiency of other specified B group vitamins: Secondary | ICD-10-CM

## 2018-11-12 NOTE — Telephone Encounter (Signed)
See note

## 2018-11-12 NOTE — Telephone Encounter (Signed)
Copied from CRM [id number]. Topic: Quick Communication - See Telephone Encounter >> Nov 12, 2018  3:41 PM Trula SladeWalter, Linda F wrote: CRM for notification. See Telephone encounter for: 11/12/18. Patient stated her provider told her to stop taking the pravastatin (PRAVACHOL) 40 MG tablet medication to see if it was the cause of the muscle pain in her arm.  She has been off of the medication for two weeks now but she is still experiencing the pain to the point of not being able to sleep at night. Please advise.

## 2018-11-13 ENCOUNTER — Other Ambulatory Visit: Payer: Self-pay | Admitting: Family Medicine

## 2018-11-13 MED ORDER — CYCLOBENZAPRINE HCL 5 MG PO TABS: 5.0000 mg | ORAL_TABLET | Freq: Three times a day (TID) | ORAL | 1 refills | Status: DC | PRN

## 2018-11-13 NOTE — Telephone Encounter (Signed)
I would start it back as her labs were normal and it's not helping the pain. Let's do trial of muscle relaxer and see if this helps her. If not we will need to see her back.

## 2018-11-13 NOTE — Telephone Encounter (Signed)
Called and spoke with patient and reviewed Dr. Blair HeysWolfe's note and pt is agreeable to a trial of muscle relaxer.  Advised patient if no relief with muscle relaxer, will need to be seen to follow up with Dr. Artis FlockWolfe.  Pt verbalized understanding.

## 2018-11-13 NOTE — Telephone Encounter (Signed)
Please see message and advise 

## 2018-11-20 ENCOUNTER — Encounter: Payer: Self-pay | Admitting: Family Medicine

## 2018-11-20 ENCOUNTER — Telehealth: Payer: Self-pay | Admitting: Family Medicine

## 2018-11-20 DIAGNOSIS — E538 Deficiency of other specified B group vitamins: Secondary | ICD-10-CM | POA: Insufficient documentation

## 2018-11-20 NOTE — Telephone Encounter (Signed)
Spoke to Wanda Medina-she will contact previous PCP to request records for copies of mammogram and DEXA scan report

## 2018-11-20 NOTE — Telephone Encounter (Signed)
Please let her know we received records from [facility], but we have no records on her mammogram/dexa, colonoscopy or immunization dates. Can she please send those to us?  Thanks!

## 2018-12-09 ENCOUNTER — Other Ambulatory Visit: Payer: Self-pay | Admitting: Family Medicine

## 2018-12-18 ENCOUNTER — Encounter: Payer: Self-pay | Admitting: Family Medicine

## 2018-12-18 ENCOUNTER — Ambulatory Visit (INDEPENDENT_AMBULATORY_CARE_PROVIDER_SITE_OTHER): Payer: BLUE CROSS/BLUE SHIELD

## 2018-12-18 DIAGNOSIS — Z23 Encounter for immunization: Secondary | ICD-10-CM

## 2018-12-18 LAB — URINALYSIS, COMPLETE

## 2018-12-18 LAB — HPV APTIMA: .: NEGATIVE

## 2018-12-19 ENCOUNTER — Other Ambulatory Visit: Payer: Self-pay | Admitting: Family Medicine

## 2018-12-22 ENCOUNTER — Other Ambulatory Visit: Payer: Self-pay | Admitting: Family Medicine

## 2018-12-22 NOTE — Telephone Encounter (Signed)
See note

## 2018-12-22 NOTE — Telephone Encounter (Signed)
Please see msg

## 2018-12-22 NOTE — Telephone Encounter (Signed)
Copied from CRM [id number]. Topic: Quick Communication - Rx Refill/Question >> Dec 22, 2018 11:12 AM Angela Nevin wrote: Medication: cyclobenzaprine (FLEXERIL) 5 MG tablet  Patient is requesting a refill of this medication stating that she is going to [place] on 1/25 and staying until 02/25 and would like a supply that would last the duration of the trip. Please advise.     Preferred Pharmacy (with phone number [facility] name):[phone] [place] [place] - [address] [phone] (Phone) [phone] (Fax)

## 2018-12-23 ENCOUNTER — Encounter: Payer: Self-pay | Admitting: Physical Therapy

## 2018-12-23 MED ORDER — CYCLOBENZAPRINE HCL 5 MG PO TABS: 5.0000 mg | ORAL_TABLET | Freq: Three times a day (TID) | ORAL | 0 refills | Status: DC | PRN

## 2019-01-13 ENCOUNTER — Other Ambulatory Visit: Payer: Self-pay | Admitting: Family Medicine

## 2019-01-13 NOTE — Telephone Encounter (Signed)
Rx Request 

## 2019-01-27 ENCOUNTER — Other Ambulatory Visit: Payer: Self-pay | Admitting: Family Medicine

## 2019-01-30 ENCOUNTER — Other Ambulatory Visit: Payer: BLUE CROSS/BLUE SHIELD

## 2019-01-30 ENCOUNTER — Other Ambulatory Visit: Payer: Self-pay | Admitting: Family Medicine

## 2019-02-02 ENCOUNTER — Ambulatory Visit: Payer: BLUE CROSS/BLUE SHIELD | Admitting: Family Medicine

## 2019-02-05 ENCOUNTER — Other Ambulatory Visit (INDEPENDENT_AMBULATORY_CARE_PROVIDER_SITE_OTHER): Payer: BLUE CROSS/BLUE SHIELD

## 2019-02-05 DIAGNOSIS — E119 Type 2 diabetes mellitus without complications: Secondary | ICD-10-CM | POA: Diagnosis not present

## 2019-02-05 LAB — HEMOGLOBIN A1C: Hgb A1c MFr Bld: 7.2 % — ABNORMAL HIGH (ref 4.6–6.5)

## 2019-02-05 LAB — BASIC METABOLIC PANEL
BUN: 20 mg/dL (ref 6–23)
CO2: 26 mEq/L (ref 19–32)
Calcium: 9.4 mg/dL (ref 8.4–10.5)
Chloride: 103 mEq/L (ref 96–112)
Creatinine, Ser: 1.32 mg/dL — ABNORMAL HIGH (ref 0.40–1.20)
GFR: 39.92 mL/min — ABNORMAL LOW (ref >60.00)
GLUCOSE: 93 mg/dL (ref 70–99)
Potassium: 4 mEq/L (ref 3.5–5.1)
Sodium: 139 mEq/L (ref 135–145)

## 2019-02-06 ENCOUNTER — Other Ambulatory Visit: Payer: BLUE CROSS/BLUE SHIELD

## 2019-02-10 ENCOUNTER — Encounter: Payer: Self-pay | Admitting: Family Medicine

## 2019-02-10 ENCOUNTER — Ambulatory Visit: Payer: BLUE CROSS/BLUE SHIELD | Admitting: Family Medicine

## 2019-02-10 VITALS — BP 138/90 | HR 94 | Temp 98.1°F | Ht 61.0 in | Wt 135.6 lb

## 2019-02-10 DIAGNOSIS — E1122 Type 2 diabetes mellitus with diabetic chronic kidney disease: Secondary | ICD-10-CM | POA: Diagnosis not present

## 2019-02-10 DIAGNOSIS — R252 Cramp and spasm: Secondary | ICD-10-CM | POA: Diagnosis not present

## 2019-02-10 DIAGNOSIS — M722 Plantar fascial fibromatosis: Secondary | ICD-10-CM | POA: Diagnosis not present

## 2019-02-10 DIAGNOSIS — Z794 Long term (current) use of insulin: Secondary | ICD-10-CM | POA: Diagnosis not present

## 2019-02-10 MED ORDER — ROSUVASTATIN CALCIUM 10 MG PO TABS: 10.0000 mg | ORAL_TABLET | Freq: Every day | ORAL | 3 refills | Status: DC

## 2019-02-10 MED ORDER — DICLOFENAC SODIUM 1 % TD GEL: 4.0000 g | Freq: Four times a day (QID) | TRANSDERMAL | 1 refills | Status: DC

## 2019-02-10 NOTE — Progress Notes (Signed)
Patient: Wanda Medina MRN: [id number] DOB: 1951-07-29 PCP: Orland Mustard, MD     Subjective:  Chief Complaint  Patient presents with  . Diabetes    HPI: The patient is a 68 y.o. female who presents today for follow up diabetes and c/o pain in the left side of left foot.   Diabetes: Patient is here for follow up of type 2 diabetes.  Currently on the following medications-bydureon, lantus 10 units and glipizide. Takes medications as prescribed. Last A1C was 7.2, down from 7.4. Currently exercising and following diabetic diet. Sugars range from 80  to 100. She states they have been this good since she started her insulin.  Denies any hypoglycemic events. Denies any vision changes, nausea, vomiting, abdominal pain, ulcers/paraesthesia in feet, polyuria, polydipsia or polyphagia. Denies any chest pain, shortness of breath.    Muscle cramping has 80% resolved since stopping statin and starting muscle relaxer. She is nervous about starting back her pravachol.   Pain on left side of foot: pain started last week on the lateral side of her heel. After she sits for a while its hard to walk. At night she has to use a cane to walk when getting up from the bed. If she has been walking it is fine. She denies any trauma. She has had plantar fascitis in the past and it feels the same. She is wearing supportive shoes. She denies any precipitating factors.   Review of Systems  Constitutional: Negative for fatigue.  Eyes: Negative for visual disturbance.  Respiratory: Negative for shortness of breath.   Cardiovascular: Negative for chest pain.  Gastrointestinal: Negative for abdominal pain and nausea.  Endocrine: Negative for polydipsia, polyphagia and polyuria.  Genitourinary: Negative for dysuria and frequency.  Musculoskeletal: Positive for arthralgias. Negative for back pain.       C/o pain on left side of left foot.  Hurts to walk on it at night  Neurological: Negative for dizziness and headaches.   Psychiatric/Behavioral: Negative for sleep disturbance.    Allergies Patient has No Known Allergies.  Past Medical History Patient  has a past medical history of Allergy, Arthritis, Chronic kidney disease, Diabetes mellitus without complication (HCC), GERD (gastroesophageal reflux disease), Hyperlipemia, and Hypertension.  Surgical History Patient  has a past surgical history that includes Medial partial knee replacement; Colonoscopy; Trigger finger release; and Appendectomy.  Family History Pateint's family history includes Arthritis in her mother; COPD in her father and mother; Heart attack in her brother, father, and mother.  Social History Patient  reports that she has never smoked. She has never used smokeless tobacco. She reports that she does not drink alcohol or use drugs.    Objective: Vitals:   02/10/19 0956  BP: 138/90  Pulse: 94  Temp: 98.1 F (36.7 C)  TempSrc: Oral  SpO2: 97%  Weight: 135 lb 9.6 oz (61.5 kg)  Height: 5\' 1"  (1.549 m)    Body mass index is 25.62 kg/m.  Physical Exam Vitals signs reviewed.  Constitutional:      Appearance: Normal appearance.  Neck:     Musculoskeletal: Normal range of motion and neck supple.  Cardiovascular:     Rate and Rhythm: Normal rate and regular rhythm.     Heart sounds: Normal heart sounds.  Pulmonary:     Effort: Pulmonary effort is normal.     Breath sounds: Normal breath sounds.  Abdominal:     General: Abdomen is flat. Bowel sounds are normal.     Palpations: Abdomen is soft.  Musculoskeletal:     Comments: Left foot: TTP over plantar fascia and with dorsiflexion  Skin:    General: Skin is warm.     Capillary Refill: Capillary refill takes less than 2 seconds.  Neurological:     General: No focal deficit present.     Mental Status: She is alert and oriented to person, place, and time.        Assessment/plan: 1. Type 2 diabetes mellitus with chronic kidney disease, with long-term current use of  insulin, unspecified CKD stage (HCC) Fasting sugars to goal. She is so close to goal we are going to give her more time to work on diet/exercise and weight loss. I don't want to adjust too much since it seems her hyperglycemic events seem to be more during the day. May need premeal insulin or we could see if we could tweak drugs or even start tradjenta with her CKD. Labs in 3 months and f/u with me in 6 months. Also doing trial of crestor. Will need foot exam at her next visit.  - Comprehensive metabolic panel; Future - Hemoglobin A1c; Future  2. Muscle cramp 80% resolved. Unsure if due to stopping statin or secondary to muscle relaxer. I do want her to try crestor and see how she does. If any issues she can stop this.   3. Plantar fasciitis of left foot Conservative therapy with exercises and handout given. Will do trial of voltaren gel as well. Discussed that this can take 9 months and she isn't interested in any injections.      Return in about 3 months (around 05/13/2019) for labs only, see you in 6! .    Orland Mustard, MD [facility]   02/10/2019

## 2019-02-10 NOTE — Patient Instructions (Signed)
Stay the course with diabetic meds. Repeat labs in 3 months. No appointment.   im going to try a different cholesterol drug called crestor on you. If any issues, just stop it.   For foot-plantar fascitis. Do the exercises and I sent in a gel to use on the foot up to 4x/day. Tylenol prn and make sure wearing supportive shoes. Let me know if not getting better in a month or two, but can last for 9 months.

## 2019-02-27 ENCOUNTER — Other Ambulatory Visit: Payer: Self-pay | Admitting: Family Medicine

## 2019-02-27 MED ORDER — CYCLOBENZAPRINE HCL 5 MG PO TABS: 5.0000 mg | ORAL_TABLET | Freq: Two times a day (BID) | ORAL | 1 refills | Status: DC

## 2019-02-27 MED ORDER — BYDUREON BCISE 2 MG/0.85ML ~~LOC~~ AUIJ: 2.0000 mg | AUTO-INJECTOR | SUBCUTANEOUS | 1 refills | Status: DC

## 2019-02-27 NOTE — Telephone Encounter (Signed)
Copied from CRM [id number]. Topic: Quick Communication - Rx Refill/Question >> Feb 27, 2019 11:02 AM Lynne Logan D wrote: Medication: BYDUREON BCISE 2 MG/0.85ML AUIJ  / cyclobenzaprine (FLEXERIL) 5 MG tablet / Pt is requesting 90 day supplies for both rx because she does not want to have to go out due to Covid 19. Please advise.  Has the patient contacted their pharmacy? Yes.   (Agent: If no, request that the patient contact the pharmacy for the refill.) (Agent: If yes, when and what did the pharmacy advise?)  [facility]erred Pharm[facility] (with phone number [facility] street name[phone] [place],  - [address] [phone] (Phone) [phone] (Fax)    Agent: Please be advised that RX refills may take up to 3 business days. We ask that you follow-up with your pharmacy.

## 2019-02-27 NOTE — Telephone Encounter (Signed)
Sent in both for 90 day px.

## 2019-02-27 NOTE — Telephone Encounter (Signed)
See note

## 2019-03-03 ENCOUNTER — Other Ambulatory Visit: Payer: Self-pay | Admitting: Family Medicine

## 2019-03-03 NOTE — Telephone Encounter (Signed)
Last OV 02/10/2019 Last refill 12/19/2018 #90/0 Next OV 04/27/2019

## 2019-03-19 ENCOUNTER — Other Ambulatory Visit: Payer: Self-pay | Admitting: Family Medicine

## 2019-03-23 ENCOUNTER — Telehealth: Payer: Self-pay | Admitting: Family Medicine

## 2019-03-23 DIAGNOSIS — I10 Essential (primary) hypertension: Secondary | ICD-10-CM

## 2019-03-23 NOTE — Telephone Encounter (Signed)
[facility] is on back order and they wondered if patient could have a different medication.

## 2019-03-23 NOTE — Telephone Encounter (Signed)
See request °

## 2019-03-23 NOTE — Telephone Encounter (Signed)
Please see msg and advise.  

## 2019-03-25 MED ORDER — HYDROCHLOROTHIAZIDE 25 MG PO TABS: ORAL_TABLET | ORAL | 3 refills | Status: DC

## 2019-03-25 NOTE — Telephone Encounter (Signed)
Yes, we can try her on lisinopril. Its an ACE-I and recommended with her diabetes. Just want to make sure she has never been on this before? She has no allergies in her chart. Side effects of ACE-I include a dry cough and angioedema (swelling of the lips). She is to call me if feels like they have a dry cough and they are to call 911 or go to ER if any signs/symptoms of angioedema.  We are going to send in a 40mg  pill since on max dose of losartan. Would break in half the first few days and make sure tolerates well before increasing to 40mg . Keep log of pressure x 2 weeks then maybe would we could set her up with doxy visit after one week?   Orland Mustard, MD [facility]

## 2019-03-25 NOTE — Telephone Encounter (Signed)
Spoke to patient and confirmed with her that she is allergic to Altace which is in the same drug class as Lisinopril.  Per Dr. Artis Flock, will change to HCTZ.  Advised patient to take 1/2 of 25 mg tablet x 3 days and then increase to 25 mg daily.  We will need to check electrolytes and kidney function at appt.  Will schedule in office visit for 4/23.  Pt verbalized understanding of the above.  Rx sent in to pharmacy

## 2019-04-02 ENCOUNTER — Encounter: Payer: Self-pay | Admitting: Family Medicine

## 2019-04-02 ENCOUNTER — Other Ambulatory Visit: Payer: Self-pay

## 2019-04-02 ENCOUNTER — Ambulatory Visit: Payer: BLUE CROSS/BLUE SHIELD | Admitting: Family Medicine

## 2019-04-02 VITALS — BP 128/88 | HR 100 | Temp 97.6°F | Ht 61.0 in | Wt 137.0 lb

## 2019-04-02 DIAGNOSIS — I10 Essential (primary) hypertension: Secondary | ICD-10-CM | POA: Diagnosis not present

## 2019-04-02 DIAGNOSIS — E782 Mixed hyperlipidemia: Secondary | ICD-10-CM

## 2019-04-02 MED ORDER — MONTELUKAST SODIUM 10 MG PO TABS: 10.0000 mg | ORAL_TABLET | Freq: Every day | ORAL | 3 refills | Status: DC

## 2019-04-02 MED ORDER — INSULIN GLARGINE 100 UNIT/ML SOLOSTAR PEN: 10.0000 [IU] | PEN_INJECTOR | Freq: Every day | SUBCUTANEOUS | 3 refills | Status: DC

## 2019-04-02 NOTE — Progress Notes (Signed)
Patient: Wanda Medina MRN: [id number] DOB: 04/20/1951 PCP: Orland MustardWolfe, Herberto Ledwell, MD   I Corky Mullonna Orphanos, LPN acted as a scribe for Dr. Artis FlockWolfe.  Subjective:  Chief Complaint  Patient presents with  . Hypertension    HPI: The patient is a 68 y.o. female who presents today for htn check up. We had to change her blood pressure medication due to losartan shortage. She has been taking the hctz; however, she tells me she has 3 months supply of losartan at home and the pharmacy is who called to say they were out of losartan. She did not need a refill. She did stop the losartan though and has been on the hctz.   Hypertension: Here for follow up of hypertension.  Currently on Amlodipine and  hctz after changing her from her losartan . Pt is not checking blood pressure at home. Takes medication as prescribed and denies any side effects. Exercise includes walking 4,0000 steps daily. Weight has been stable. Denies any chest pain, headaches, shortness of breath, vision changes, swelling in lower extremities.   Hyperlipidemia: has stopped her crestor due to her myalgia. Her muscle pain went away.   Diabetes: fasting blood sugars have been around 120. She wasn't taking her glipizide before and she started taking her glipizide and her sugar was 85 this AM. She has really been walking more. Last a1c was 7.2. due for labs in June.   Review of Systems  Constitutional: Negative for chills, fatigue and fever.  Eyes: Negative for visual disturbance.  Respiratory: Negative for cough, shortness of breath and wheezing.   Cardiovascular: Negative for chest pain, palpitations and leg swelling.  Gastrointestinal: Negative for abdominal pain, diarrhea, nausea and vomiting.  Musculoskeletal: Positive for myalgias.  Skin: Negative.   Neurological: Negative for dizziness and headaches.    Allergies Patient is allergic to ace inhibitors; altace [ramipril]; and lisinopril.  Past Medical History Patient  has a past medical  history of Allergy, Arthritis, Chronic kidney disease, Diabetes mellitus without complication (HCC), GERD (gastroesophageal reflux disease), Hyperlipemia, and Hypertension.  Surgical History Patient  has a past surgical history that includes Medial partial knee replacement; Colonoscopy; Trigger finger release; and Appendectomy.  Family History Pateint's family history includes Arthritis in her mother; COPD in her father and mother; Heart attack in her brother, father, and mother.  Social History Patient  reports that she has never smoked. She has never used smokeless tobacco. She reports that she does not drink alcohol or use drugs.    Objective: Vitals:   04/02/19 1307  BP: 128/88  Pulse: 100  Temp: 97.6 F (36.4 C)  TempSrc: Oral  SpO2: 97%  Weight: 137 lb (62.1 kg)  Height: 5\' 1"  (1.549 m)    Body mass index is 25.89 kg/m.  Physical Exam     Assessment/plan: 1. Benign essential HTN Near goal on hctz; however, I want her to change back to her losartan. I think there was a lot of confusion from pharmacy requesting we change her medication due to shortage to not knowing she still had a 3 month supply of her losartan at home. Not sure why she didn't tell us this. Will have her go back on losartan for nephro protection and continue her norvasc. Hopefully there will be no shortage in 3 months when due for a refill. F/u for normal appointment.   2. Mixed hyperlipidemia Would like her to do trial of crestor every other night and see how she does with this.     Labs  in June for diabetes. She prefers to f/u with me in September when back from maternity leave for normal diabetic f/u.   Return if symptoms worsen or fail to improve.    Orland Mustard, MD [facility]  04/02/2019

## 2019-04-02 NOTE — Patient Instructions (Signed)
Labs in June and i'll see you in September!

## 2019-04-22 ENCOUNTER — Other Ambulatory Visit: Payer: Self-pay | Admitting: Family Medicine

## 2019-04-27 ENCOUNTER — Ambulatory Visit: Payer: BLUE CROSS/BLUE SHIELD | Admitting: Family Medicine

## 2019-04-30 ENCOUNTER — Other Ambulatory Visit: Payer: Self-pay | Admitting: Family Medicine

## 2019-05-11 ENCOUNTER — Other Ambulatory Visit: Payer: Self-pay

## 2019-05-11 ENCOUNTER — Other Ambulatory Visit (INDEPENDENT_AMBULATORY_CARE_PROVIDER_SITE_OTHER): Payer: BLUE CROSS/BLUE SHIELD

## 2019-05-11 DIAGNOSIS — Z794 Long term (current) use of insulin: Secondary | ICD-10-CM

## 2019-05-11 DIAGNOSIS — E1122 Type 2 diabetes mellitus with diabetic chronic kidney disease: Secondary | ICD-10-CM | POA: Diagnosis not present

## 2019-05-11 LAB — COMPREHENSIVE METABOLIC PANEL
ALT: 15 U/L (ref 0–35)
AST: 20 U/L (ref 0–37)
Albumin: 4 g/dL (ref 3.5–5.2)
Alkaline Phosphatase: 70 U/L (ref 39–117)
BUN: 17 mg/dL (ref 6–23)
CO2: 26 mEq/L (ref 19–32)
Calcium: 9.4 mg/dL (ref 8.4–10.5)
Chloride: 104 mEq/L (ref 96–112)
Creatinine, Ser: 1.41 mg/dL — ABNORMAL HIGH (ref 0.40–1.20)
GFR: 36.97 mL/min — ABNORMAL LOW (ref >60.00)
Glucose, Bld: 82 mg/dL (ref 70–99)
Potassium: 3.9 mEq/L (ref 3.5–5.1)
Sodium: 139 mEq/L (ref 135–145)
Total Bilirubin: 0.4 mg/dL (ref 0.2–1.2)
Total Protein: 7.4 g/dL (ref 6.0–8.3)

## 2019-05-11 LAB — HEMOGLOBIN A1C: Hgb A1c MFr Bld: 7.6 % — ABNORMAL HIGH (ref 4.6–6.5)

## 2019-05-15 ENCOUNTER — Other Ambulatory Visit: Payer: Self-pay | Admitting: Family Medicine

## 2019-05-15 MED ORDER — LINAGLIPTIN 5 MG PO TABS: 5.0000 mg | ORAL_TABLET | Freq: Every day | ORAL | 1 refills | Status: DC

## 2019-05-15 NOTE — Progress Notes (Signed)
bydureon stopped due to GFR/renal function. Started her on tradjenta. Asked that she write down her fasting sugars and 2 hour post prandial and to call us back with these readings in 2 weeks time so we can adjust long acting insulin and see if we need to start pre prandial insulin.  Orland Mustard, MD [place] [facility]

## 2019-05-18 ENCOUNTER — Telehealth: Payer: Self-pay

## 2019-05-18 DIAGNOSIS — E1122 Type 2 diabetes mellitus with diabetic chronic kidney disease: Secondary | ICD-10-CM | POA: Insufficient documentation

## 2019-05-18 NOTE — Telephone Encounter (Signed)
Spoke to [place] at [place].  PA initiated over the phone for Tradjenta 5 mg tabs qty #90.   Tradjenta approved from 05/18/19-05/17/20.  [facility] [place] [facility] [address].

## 2019-05-29 ENCOUNTER — Other Ambulatory Visit: Payer: Self-pay | Admitting: Family Medicine

## 2019-06-18 ENCOUNTER — Other Ambulatory Visit: Payer: Self-pay | Admitting: Family Medicine

## 2019-08-10 ENCOUNTER — Telehealth: Payer: Self-pay | Admitting: Family Medicine

## 2019-08-10 NOTE — Telephone Encounter (Signed)
Medication Refill - Medication: Insulin Glargine (LANTUS SOLOSTAR) 100 UNIT/ML Solostar Pen  Has the patient contacted their pharmacy? Yes - pt is running out because RX is written to take 10 units but she is taking 16 units per day and they will not fill early for her (Agent: If no, request that the patient contact the pharmacy for the refill.) (Agent: If yes, when and what did the pharmacy advise?)  Preferred Pharmacy (with phone number or street name):  [facility] [place], [place] [address] [phone] (Phone) [phone] (Fax)   Agent: Please be advised that RX refills may take up to 3 business days. We ask that you follow-up with your pharmacy.

## 2019-08-10 NOTE — Telephone Encounter (Signed)
Requested medication (s) are due for refill today: yes  Requested medication (s) are on the active medication list: yes  Last refill:  04/02/2019  Future visit scheduled: no  Notes to clinic:  Pt need new RX She reports that she is taking 16Units Rx for 10units. She is running out because of the dose difference. See patients note    Requested Prescriptions  Pending Prescriptions Disp Refills   Insulin Glargine (LANTUS SOLOSTAR) 100 UNIT/ML Solostar Pen 5 pen 3    Sig: Inject 10 Units into the skin at bedtime.     Endocrinology:  Diabetes - Insulins Passed - 08/10/2019  4:45 PM      Passed - HBA1C is between 0 and 7.9 and within 180 days    Hgb A1c MFr Bld  Date Value Ref Range Status  05/11/2019 7.6 (H) 4.6 - 6.5 % Final    Comment:    Glycemic Control Guidelines for People with Diabetes:Non Diabetic:  <6%Goal of Therapy: <7%Additional Action Suggested:  >8%          Passed - Valid encounter within last 6 months    Recent Outpatient Visits          4 months ago Benign essential HTN   [place] Wolfe, Ebony Hail, MD   6 months ago Type 2 diabetes mellitus with chronic kidney disease, with long-term current use of insulin, unspecified CKD stage ([place])   [facility] Jacqualine Code, MD   9 months ago Encounter for [employer] annual wellness exam   [facility] Jacqualine Code, MD

## 2019-08-11 MED ORDER — LANTUS SOLOSTAR 100 UNIT/ML ~~LOC~~ SOPN: 10.0000 [IU] | PEN_INJECTOR | Freq: Every day | SUBCUTANEOUS | 3 refills | Status: DC

## 2019-08-11 NOTE — Telephone Encounter (Signed)
See note

## 2019-08-12 ENCOUNTER — Other Ambulatory Visit: Payer: Self-pay

## 2019-08-12 DIAGNOSIS — E1122 Type 2 diabetes mellitus with diabetic chronic kidney disease: Secondary | ICD-10-CM

## 2019-08-12 DIAGNOSIS — Z794 Long term (current) use of insulin: Secondary | ICD-10-CM

## 2019-08-14 MED ORDER — LANTUS SOLOSTAR 100 UNIT/ML ~~LOC~~ SOPN: 16.0000 [IU] | PEN_INJECTOR | Freq: Every day | SUBCUTANEOUS | 3 refills | Status: DC

## 2019-08-14 NOTE — Telephone Encounter (Signed)
Spoke to patient and advised that refill for her Lantus insulin has been sent in to her pharmacy.  She verbalized understanding.

## 2019-08-14 NOTE — Telephone Encounter (Signed)
Sent in refill and changed sig to say 16 units.  Wanda Flaming, MD [place]

## 2019-08-14 NOTE — Addendum Note (Signed)
Addended by: Orma Flaming on: 08/14/2019 12:27 PM   Modules accepted: Orders

## 2019-08-14 NOTE — Telephone Encounter (Signed)
Pt called to follow up on rx for Insulin Glargine (LANTUS SOLOSTAR) 100 UNIT/ML Solostar Pen. Stated she is taking 16 units and is not out of medication. Please advise. Requesting CB.

## 2019-08-18 ENCOUNTER — Telehealth: Payer: Self-pay

## 2019-08-18 NOTE — Telephone Encounter (Signed)
Copied from [place] [id number]. Topic: General - Call Back - No Documentation >> Aug 18, 2019 12:11 PM Reyne Dumas L wrote: Reason for CRM:   Pt returning a call from the office.  States no message was left.

## 2019-08-20 ENCOUNTER — Other Ambulatory Visit: Payer: Self-pay

## 2019-08-20 ENCOUNTER — Other Ambulatory Visit (INDEPENDENT_AMBULATORY_CARE_PROVIDER_SITE_OTHER): Payer: BC Managed Care – PPO

## 2019-08-20 DIAGNOSIS — E1122 Type 2 diabetes mellitus with diabetic chronic kidney disease: Secondary | ICD-10-CM | POA: Diagnosis not present

## 2019-08-20 DIAGNOSIS — Z794 Long term (current) use of insulin: Secondary | ICD-10-CM

## 2019-08-20 LAB — COMPREHENSIVE METABOLIC PANEL
ALT: 19 U/L (ref 0–35)
AST: 22 U/L (ref 0–37)
Albumin: 3.9 g/dL (ref 3.5–5.2)
Alkaline Phosphatase: 71 U/L (ref 39–117)
BUN: 21 mg/dL (ref 6–23)
CO2: 25 mEq/L (ref 19–32)
Calcium: 9.2 mg/dL (ref 8.4–10.5)
Chloride: 104 mEq/L (ref 96–112)
Creatinine, Ser: 1.42 mg/dL — ABNORMAL HIGH (ref 0.40–1.20)
GFR: 36.64 mL/min — ABNORMAL LOW (ref >60.00)
Glucose, Bld: 111 mg/dL — ABNORMAL HIGH (ref 70–99)
Potassium: 4.3 mEq/L (ref 3.5–5.1)
Sodium: 138 mEq/L (ref 135–145)
Total Bilirubin: 0.4 mg/dL (ref 0.2–1.2)
Total Protein: 7.1 g/dL (ref 6.0–8.3)

## 2019-08-20 LAB — HEMOGLOBIN A1C: Hgb A1c MFr Bld: 8.6 % — ABNORMAL HIGH (ref 4.6–6.5)

## 2019-08-21 ENCOUNTER — Other Ambulatory Visit: Payer: Self-pay | Admitting: Family Medicine

## 2019-08-21 ENCOUNTER — Ambulatory Visit (INDEPENDENT_AMBULATORY_CARE_PROVIDER_SITE_OTHER): Payer: BC Managed Care – PPO | Admitting: Family Medicine

## 2019-08-21 ENCOUNTER — Encounter: Payer: Self-pay | Admitting: Family Medicine

## 2019-08-21 VITALS — BP 136/85 | HR 96 | Temp 97.6°F | Ht 61.0 in | Wt 136.2 lb

## 2019-08-21 DIAGNOSIS — Z794 Long term (current) use of insulin: Secondary | ICD-10-CM

## 2019-08-21 DIAGNOSIS — N183 Chronic kidney disease, stage 3 (moderate): Secondary | ICD-10-CM | POA: Diagnosis not present

## 2019-08-21 DIAGNOSIS — E1122 Type 2 diabetes mellitus with diabetic chronic kidney disease: Secondary | ICD-10-CM | POA: Diagnosis not present

## 2019-08-21 DIAGNOSIS — I10 Essential (primary) hypertension: Secondary | ICD-10-CM

## 2019-08-21 DIAGNOSIS — M8580 Other specified disorders of bone density and structure, unspecified site: Secondary | ICD-10-CM | POA: Diagnosis not present

## 2019-08-21 MED ORDER — OMEPRAZOLE 20 MG PO CPDR: DELAYED_RELEASE_CAPSULE | ORAL | 1 refills | Status: DC

## 2019-08-21 MED ORDER — NOVOLOG FLEXPEN 100 UNIT/ML ~~LOC~~ SOPN: 5.0000 [IU] | PEN_INJECTOR | Freq: Three times a day (TID) | SUBCUTANEOUS | 3 refills | Status: DC

## 2019-08-21 MED ORDER — AMLODIPINE BESYLATE 10 MG PO TABS: 10.0000 mg | ORAL_TABLET | Freq: Every day | ORAL | 1 refills | Status: DC

## 2019-08-21 MED ORDER — GLIPIZIDE 5 MG PO TABS: 5.0000 mg | ORAL_TABLET | Freq: Two times a day (BID) | ORAL | 0 refills | Status: DC

## 2019-08-21 MED ORDER — CYCLOBENZAPRINE HCL 5 MG PO TABS: 5.0000 mg | ORAL_TABLET | Freq: Two times a day (BID) | ORAL | 1 refills | Status: DC

## 2019-08-21 NOTE — Telephone Encounter (Signed)
Dr. Wolfe patient 

## 2019-08-21 NOTE — Patient Instructions (Addendum)
We are going to start pre meal insulin... Continue your lantus, glipizide and tradjenta. I want you to check you sugars before you eat meals and then 2 hours after. DO not give novolog (pre meal) insulin if sugar is 120 or less before you eat.   im going to start you out at 5 unit of novolog before each  Meal, but you may not need if your meals are really small at breakfast and dinner.   Keep logs for me and bring to next visit. Any low blood sugar, make sure you have sugar tab or peanut butter/orange juice ot bring these up.   Start exercising again and we are going to send you to [place], our PA for nutrition.   Flu shot in October!   See you in 3 months. Come fasting to this appointment.

## 2019-08-21 NOTE — Progress Notes (Signed)
Patient: Wanda Medina MRN: [id number] DOB: 09/25/1951 PCP: Orland MustardWolfe, Chontel Warning, MD     Subjective:  Chief Complaint  Patient presents with  . Diabetes  . Hypertension    HPI: The patient is a 68 y.o. female who presents today for follow up of diabetes.   Diabetes: Patient is here for follow up of type 2 diabetes. We had to stop her bydureon at her last lab check due to decreased GFR. I increased her insulin and started tradjenta at that time. Currently on the following medications tradjenta, glipizide and lantus 16 units. Takes medications as prescribed. Last A1C was 8.6. Currently NOT exercising and NOT following a diabetic diet.  Fasting am Sugars range from 70 to 100. Denies any hypoglycemic events. Denies any vision changes, nausea, vomiting, abdominal pain, ulcers/paraesthesia in feet, polyuria, polydipsia or polyphagia. Denies any chest pain, shortness of breath. She also stopped walking.   Hypertension: Here for follow up of hypertension.  Currently on norvasc 10mg  and cozaar 100mg  . Takes medication as prescribed and denies any side effects. Exercise includes none recently. Weight has been stable. Denies any chest pain, headaches, shortness of breath, vision changes, swelling in lower extremities.   Osteopenia: she has not had this in a long time. Also will need to check vitamin D.   Review of Systems  Constitutional: Positive for fatigue. Negative for chills and fever.  HENT: Positive for ear pain. Negative for congestion, postnasal drip, rhinorrhea and sore throat.        C/o intermittent R ear pain x several wks, also hurts behind ear  Eyes: Negative for visual disturbance.  Respiratory: Negative for cough and shortness of breath.   Cardiovascular: Negative for chest pain, palpitations and leg swelling.  Musculoskeletal: Positive for myalgias. Negative for arthralgias, back pain and neck pain.  Neurological: Negative for dizziness and headaches.  Psychiatric/Behavioral: Positive  for sleep disturbance.    Allergies Patient is allergic to ace inhibitors; altace [ramipril]; and lisinopril.  Past Medical History Patient  has a past medical history of Allergy, Arthritis, Chronic kidney disease, Diabetes mellitus without complication (HCC), GERD (gastroesophageal reflux disease), Hyperlipemia, and Hypertension.  Surgical History Patient  has a past surgical history that includes Medial partial knee replacement; Colonoscopy; Trigger finger release; and Appendectomy.  Family History Pateint's family history includes Arthritis in her mother; COPD in her father and mother; Heart attack in her brother, father, and mother.  Social History Patient  reports that she has never smoked. She has never used smokeless tobacco. She reports that she does not drink alcohol or use drugs.    Objective: Vitals:   08/21/19 1000  BP: 136/85  Pulse: 96  Temp: 97.6 F (36.4 C)  TempSrc: Skin  SpO2: 96%  Weight: 136 lb 3.2 oz (61.8 kg)  Height: 5\' 1"  (1.549 m)    Body mass index is 25.73 kg/m.  Physical Exam Vitals signs reviewed.  Constitutional:      Appearance: Normal appearance. She is obese.  HENT:     Head: Normocephalic and atraumatic.     Right Ear: Tympanic membrane, ear canal and external ear normal.     Left Ear: Tympanic membrane, ear canal and external ear normal.     Nose: Nose normal.     Mouth/Throat:     Mouth: Mucous membranes are moist.  Eyes:     Extraocular Movements: Extraocular movements intact.     Pupils: Pupils are equal, round, and reactive to light.  Neck:     Musculoskeletal:  Normal range of motion and neck supple.  Cardiovascular:     Rate and Rhythm: Normal rate and regular rhythm.     Heart sounds: Normal heart sounds.  Pulmonary:     Effort: Pulmonary effort is normal.     Breath sounds: Normal breath sounds.  Abdominal:     General: Abdomen is flat. Bowel sounds are normal.     Palpations: Abdomen is soft.  Skin:    General:  Skin is warm and dry.     Capillary Refill: Capillary refill takes less than 2 seconds.  Neurological:     General: No focal deficit present.     Mental Status: She is alert and oriented to person, place, and time.  Psychiatric:        Mood and Affect: Mood normal.        Behavior: Behavior normal.        Assessment/plan: 1. Type 2 diabetes mellitus with stage 3 chronic kidney disease, with long-term current use of insulin (HCC) Out of control. Diet is poor and she has been sedentary these last 3+ months with covid. a1c up to 8.6. also stopped her bydureon due to decreased  GFR. Had long discussion today. Finally understand she is eating poor (lots of nann). Referral to nutritionist and discussed exercise and importance of getting this under control. Her renal function is slowly worsening due to uncontrolled diabetes. Creatinine slowly creeping up. discussed I will give her 3 more months and if we can't get this better will refer to endocrine. We are going to start premeal insulin. Her lantus is low based of dosing, but her fasting sugars are to goal. Will start her on 5 units premeal novolog. Sugars before and 2 hours after meals.  Hold if sugar less than 120. Keep very detailed log for me. Discussed diet. Will also need renal referral if continues to worsen. Foot exam at next visit. Flu shot in october. utd on eye exam. Requested records. Hypoglycemic precautions given.   2. Benign essential HTN Blood pressure is to goal. Continue current anti-hypertensive medications. Refills given and lab work reviewed. Recommended routine exercise and healthy diet including DASH diet and mediterranean diet. Encouraged weight loss. F/u in 3 months- due for fasting labs.     3. Osteopenia, unspecified location Order vitamin d on next lab visit. dexa ordered. Discussed weight bearing exercises.  - DG Bone Density; Future   Return in about 3 months (around 11/20/2019) for diabetes/fasting labs/annual  .   Orma Flaming, MD [place]  08/21/2019

## 2019-08-26 ENCOUNTER — Telehealth: Payer: Self-pay | Admitting: Physical Therapy

## 2019-08-26 MED ORDER — INSULIN LISPRO (1 UNIT DIAL) 100 UNIT/ML (KWIKPEN): 5.0000 [IU] | PEN_INJECTOR | Freq: Three times a day (TID) | SUBCUTANEOUS | 11 refills | Status: DC

## 2019-08-26 NOTE — Telephone Encounter (Signed)
Copied from [place] [id number]. Topic: General - Other >> Aug 26, 2019  1:39 PM Pauline Good wrote: Reason for CRM: want to know status prior auth for Novo log pen

## 2019-08-26 NOTE — Telephone Encounter (Signed)
Spoke to patient and informed her that Dr. Rogers Blocker has sent in Humalog for her and we will contact her if any problems.  She verbalized understanding.

## 2019-08-26 NOTE — Addendum Note (Signed)
Addended by: Orma Flaming on: 08/26/2019 02:33 PM   Modules accepted: Orders

## 2019-08-26 NOTE — Telephone Encounter (Signed)
Let her know I sent in another short acting insulin called humalog. Hopefully this will be covered. I can't do a PA if she has not failed another short acting insulin.   Dr.Ebbie Cherry

## 2019-08-28 ENCOUNTER — Encounter: Payer: Self-pay | Admitting: Physician Assistant

## 2019-08-28 ENCOUNTER — Other Ambulatory Visit: Payer: Self-pay

## 2019-08-28 ENCOUNTER — Ambulatory Visit (INDEPENDENT_AMBULATORY_CARE_PROVIDER_SITE_OTHER): Payer: BC Managed Care – PPO | Admitting: Physician Assistant

## 2019-08-28 VITALS — BP 126/80 | HR 101 | Temp 97.7°F | Ht 61.0 in | Wt 135.5 lb

## 2019-08-28 DIAGNOSIS — Z713 Dietary counseling and surveillance: Secondary | ICD-10-CM

## 2019-08-28 DIAGNOSIS — E1122 Type 2 diabetes mellitus with diabetic chronic kidney disease: Secondary | ICD-10-CM

## 2019-08-28 DIAGNOSIS — N183 Chronic kidney disease, stage 3 unspecified: Secondary | ICD-10-CM

## 2019-08-28 DIAGNOSIS — Z794 Long term (current) use of insulin: Secondary | ICD-10-CM

## 2019-08-28 NOTE — Progress Notes (Signed)
Wanda Medina is a 68 y.o. female here for Nutrition Counseling.  I acted as a Neurosurgeon for [employer], PA-C Corky Mull, LPN  History of Present Illness:   Chief Complaint  Patient presents with  . Nutrition Counseling    HPI  Patient is here to discuss Nutrition and diet. Pt would like to lower A1c.  She is working closely with her PCP on better medication management as well as finally seeing a dietitian today.  She does not exercise at all.  Lab Results  Component Value Date   HGBA1C 8.6 (H) 08/20/2019   Dietary recall: Dietary recall difficult to obtain, because she states that she essentially grazes throughout the day.  Does have one regular meal around noon, and this is typically her largest meal the day. She is vegetarian and does not eat eggs. She does fast 2 times a month, but her fasting does incorporate some foods because she knows that she cannot tolerate not eating for entire day due to her diabetes.  She makes a lot of foods herself.  She does not eat concentrated sweets such as cakes, cookies.  She states that she only eats candy if her blood sugars get below 60.  She does try to eat whole-wheat products when she can.  She does eat large amounts of legumes, bread products that she makes, pita bread.  She does state that her daughters helped her this past weekend portion her lunches into individual servings, and because of this she had pretty well controlled, reasonable portions at each lunch this week.  Beverages  --only drinks water  Weight: Wt Readings from Last 3 Encounters:  08/28/19 135 lb 8 oz (61.5 kg)  08/21/19 136 lb 3.2 oz (61.8 kg)  04/02/19 137 lb (62.1 kg)   Goals: Reduce hemoglobin A1c  Estimated daily energy needs: Calories: 1400-1550 kcal Protein: 45-55 g Fluid: >1600 ml  Past Medical History:  Diagnosis Date  . Allergy   . Arthritis   . Chronic kidney disease    AKI saw Nephrologist in [place]  . Diabetes mellitus without  complication (HCC)   . GERD (gastroesophageal reflux disease)   . Hyperlipemia   . Hypertension      Social History   Socioeconomic History  . Marital status: Married    Spouse name: Not on file  . Number of children: 3  . Years of education: Not on file  . Highest education level: Not on file  Occupational History  . Not on file  Social Needs  . Financial resource strain: Not on file  . Food insecurity    Worry: Not on file    Inability: Not on file  . Transportation needs    Medical: Not on file    Non-medical: Not on file  Tobacco Use  . Smoking status: Never Smoker  . Smokeless tobacco: Never Used  Substance and Sexual Activity  . Alcohol use: No  . Drug use: No  . Sexual activity: Not Currently  Lifestyle  . Physical activity    Days per week: Not on file    Minutes per session: Not on file  . Stress: Not on file  Relationships  . Social Musician on phone: Not on file    Gets together: Not on file    Attends religious service: Not on file    Active member of club or organization: Not on file    Attends meetings of clubs or organizations: Not on file  Relationship status: Not on file  . Intimate partner violence    Fear of current or ex partner: Not on file    Emotionally abused: Not on file    Physically abused: Not on file    Forced sexual activity: Not on file  Other Topics Concern  . Not on file  Social History Narrative  . Not on file    Past Surgical History:  Procedure Laterality Date  . APPENDECTOMY    . COLONOSCOPY    . MEDIAL PARTIAL KNEE REPLACEMENT     Rt. knee in 05/2016  . TRIGGER FINGER RELEASE     both hands    Family History  Problem Relation Age of Onset  . Arthritis Mother   . COPD Mother   . Heart attack Mother   . COPD Father   . Heart attack Father   . Heart attack Brother   . Colon cancer Neg Hx   . Colon polyps Neg Hx   . Esophageal cancer Neg Hx   . Rectal cancer Neg Hx   . Stomach cancer Neg Hx      Allergies  Allergen Reactions  . Ace Inhibitors     cough  . Altace [Ramipril] Cough  . Lisinopril Cough    Current Medications:   Current Outpatient Medications:  .  amLODipine (NORVASC) 10 MG tablet, Take 1 tablet (10 mg total) by mouth daily., Disp: 90 tablet, Rfl: 1 .  BD PEN NEEDLE NANO U/F 32G X 4 MM MISC, USE 1 NEEDLE TO INJECT SUBCUTANEOUSLY EVERY NIGHT, Disp: 100 each, Rfl: 1 .  calcium-vitamin D (OSCAL WITH D) 500-200 MG-UNIT tablet, Take 1 tablet by mouth., Disp: , Rfl:  .  cyclobenzaprine (FLEXERIL) 5 MG tablet, Take 1 tablet (5 mg total) by mouth 2 (two) times daily., Disp: 180 tablet, Rfl: 1 .  ferrous sulfate 324 (65 Fe) MG TBEC, Take by mouth., Disp: , Rfl:  .  gabapentin (NEURONTIN) 100 MG capsule, TAKE 1 CAPSULE BY MOUTH TWICE DAILY (Patient taking differently: Take 100 mg by mouth daily. ), Disp: 180 capsule, Rfl: 2 .  glipiZIDE (GLUCOTROL) 5 MG tablet, Take 1 tablet (5 mg total) by mouth 2 (two) times daily., Disp: 180 tablet, Rfl: 0 .  Insulin Glargine (LANTUS SOLOSTAR) 100 UNIT/ML Solostar Pen, Inject 16 Units into the skin at bedtime., Disp: 5 pen, Rfl: 3 .  insulin lispro (HUMALOG KWIKPEN) 100 UNIT/ML KwikPen, Inject 0.05 mLs (5 Units total) into the skin 3 (three) times daily., Disp: 15 mL, Rfl: 11 .  linagliptin (TRADJENTA) 5 MG TABS tablet, Take 1 tablet (5 mg total) by mouth daily., Disp: 90 tablet, Rfl: 1 .  losartan (COZAAR) 100 MG tablet, TAKE 1 TABLET BY MOUTH EVERY DAY IN THE EVENING, Disp: 90 tablet, Rfl: 1 .  mometasone (NASONEX) 50 MCG/ACT nasal spray, Place into the nose., Disp: , Rfl:  .  montelukast (SINGULAIR) 10 MG tablet, Take 1 tablet (10 mg total) by mouth at bedtime., Disp: 90 tablet, Rfl: 3 .  omeprazole (PRILOSEC) 20 MG capsule, TAKE 1 CAPSULE BY MOUTH EVERY DAY, Disp: 90 capsule, Rfl: 1 .  ONETOUCH ULTRA test strip, USE AS DIRECTED TO TEST BLOOD SUGARS ONCE A DAY, Disp: 100 strip, Rfl: 0 .  rosuvastatin (CRESTOR) 10 MG tablet, Take 1  tablet (10 mg total) by mouth daily., Disp: 90 tablet, Rfl: 3 .  vitamin B-12 (CYANOCOBALAMIN) 500 MCG tablet, Take 1,000 mcg by mouth daily. , Disp: , Rfl:  .  zinc  gluconate 50 MG tablet, Take 50 mg by mouth daily., Disp: , Rfl:   Current Facility-Administered Medications:  .  0.9 %  sodium chloride infusion, 500 mL, Intravenous, Continuous, Danis, Starr LakeHenry L III, MD   Review of Systems:   ROS Negative unless otherwise specified per HPI.  Vitals:   Vitals:   08/28/19 0919  BP: 126/80  Pulse: (!) 101  Temp: 97.7 F (36.5 C)  TempSrc: Temporal  SpO2: 98%  Weight: 135 lb 8 oz (61.5 kg)  Height: 5\' 1"  (1.549 m)     Body mass index is 25.6 kg/m.  Physical Exam:   Physical Exam Constitutional:      Appearance: She is well-developed.  HENT:     Head: Normocephalic and atraumatic.  Eyes:     Conjunctiva/sclera: Conjunctivae normal.  Neck:     Musculoskeletal: Normal range of motion and neck supple.  Pulmonary:     Effort: Pulmonary effort is normal.  Musculoskeletal: Normal range of motion.  Skin:    General: Skin is warm and dry.  Neurological:     Mental Status: She is alert and oriented to person, place, and time.  Psychiatric:        Behavior: Behavior normal.        Thought Content: Thought content normal.        Judgment: Judgment normal.     Assessment and Plan:    Smita was seen today for nutrition counseling.  Diagnoses and all orders for this visit:  Type 2 diabetes mellitus with stage 3 chronic kidney disease, with long-term current use of insulin (HCC)  Encounter for nutritional counseling   Discussed specific, individualized recommendations regarding nutrition including decreasing sugary beverages, limiting portions, balancing out meals, and eating regularly throughout the day. Handouts provided included: Balanced Plate, Balanced Snack List. Provided emotional support and encouraged slow, steady weight loss. Patient's questions answered throughout  encounter. Follow-up with me prn.  Specific recommendations given in AVS.  . Reviewed expectations re: course of current medical issues. . Discussed self-management of symptoms. . Outlined signs and symptoms indicating need for more acute intervention. . Patient verbalized understanding and all questions were answered. . See orders for this visit as documented in the electronic medical record. . Patient received an After-Visit Summary.  CMA or LPN served as scribe during this visit. History, Physical, and Plan performed by medical provider. Documentation and orders reviewed and attested to.  I spent 25 minutes with this patient, greater than 50% was face-to-face time counseling regarding the above diagnoses.   Jarold MottoSamantha Jalon Blackwelder, PA-C

## 2019-08-28 NOTE — Patient Instructions (Signed)
It was great to see you!  1.  Every time that you have fruit as a snack, I recommend that you add a source of protein. 2.  Anytime you have a carbohydrate, starch, fruit --please refer to the handout that we went over today to look at the recommended portion size 3.  When you eat food like pizza, try to have a salad or other vegetable with it, so you eat less of the pizza 4.  Continue to work on smaller lunch meals  Take care,  Inda Coke PA-C

## 2019-09-08 ENCOUNTER — Other Ambulatory Visit: Payer: Self-pay | Admitting: Family Medicine

## 2019-09-08 MED ORDER — ONETOUCH ULTRA VI STRP: ORAL_STRIP | 0 refills | Status: DC

## 2019-09-08 NOTE — Telephone Encounter (Signed)
Copied from [place] [id number]. Topic: Quick Communication - Rx Refill/Question >> Sep 08, 2019  3:49 PM Rainey Pines A wrote: Medication: ONETOUCH ULTRA test strip (Patient is completely out of test strips)  Has the patient contacted their pharmacy? Yes (Agent: If no, request that the patient contact the pharmacy for the refill.) (Agent: If yes, when and what did the pharmacy advise?)Contact PCP  Preferred Pharmacy (with phone number or street name): [facility] [place], [place] [address] [phone] (Phone) [phone] (Fax)    Agent: Please be advised that RX refills may take up to 3 business days. We ask that you follow-up with your pharmacy.

## 2019-10-15 ENCOUNTER — Other Ambulatory Visit: Payer: Self-pay | Admitting: Family Medicine

## 2019-10-28 ENCOUNTER — Other Ambulatory Visit: Payer: Self-pay | Admitting: Family Medicine

## 2019-11-16 ENCOUNTER — Other Ambulatory Visit: Payer: Self-pay | Admitting: Family Medicine

## 2019-11-19 ENCOUNTER — Other Ambulatory Visit: Payer: Self-pay

## 2019-11-20 ENCOUNTER — Other Ambulatory Visit: Payer: Self-pay | Admitting: Family Medicine

## 2019-11-20 ENCOUNTER — Encounter: Payer: Self-pay | Admitting: Family Medicine

## 2019-11-20 ENCOUNTER — Ambulatory Visit (INDEPENDENT_AMBULATORY_CARE_PROVIDER_SITE_OTHER): Payer: BC Managed Care – PPO | Admitting: Family Medicine

## 2019-11-20 VITALS — BP 135/90 | HR 95 | Temp 97.5°F | Ht 61.0 in | Wt 131.2 lb

## 2019-11-20 DIAGNOSIS — M8580 Other specified disorders of bone density and structure, unspecified site: Secondary | ICD-10-CM

## 2019-11-20 DIAGNOSIS — N1831 Chronic kidney disease, stage 3a: Secondary | ICD-10-CM

## 2019-11-20 DIAGNOSIS — E538 Deficiency of other specified B group vitamins: Secondary | ICD-10-CM | POA: Diagnosis not present

## 2019-11-20 DIAGNOSIS — I10 Essential (primary) hypertension: Secondary | ICD-10-CM | POA: Diagnosis not present

## 2019-11-20 DIAGNOSIS — Z794 Long term (current) use of insulin: Secondary | ICD-10-CM | POA: Diagnosis not present

## 2019-11-20 DIAGNOSIS — N183 Chronic kidney disease, stage 3 unspecified: Secondary | ICD-10-CM

## 2019-11-20 DIAGNOSIS — Z0000 Encounter for general adult medical examination without abnormal findings: Secondary | ICD-10-CM | POA: Diagnosis not present

## 2019-11-20 DIAGNOSIS — E1122 Type 2 diabetes mellitus with diabetic chronic kidney disease: Secondary | ICD-10-CM

## 2019-11-20 DIAGNOSIS — E1121 Type 2 diabetes mellitus with diabetic nephropathy: Secondary | ICD-10-CM

## 2019-11-20 DIAGNOSIS — E782 Mixed hyperlipidemia: Secondary | ICD-10-CM

## 2019-11-20 LAB — CBC WITH DIFFERENTIAL/PLATELET
Basophils Absolute: 0 10*3/uL (ref 0.0–0.1)
Basophils Relative: 0.8 % (ref 0.0–3.0)
Eosinophils Absolute: 0.2 10*3/uL (ref 0.0–0.7)
Eosinophils Relative: 4.3 % (ref 0.0–5.0)
HCT: 38.4 % (ref 36.0–46.0)
Hemoglobin: 12.5 g/dL (ref 12.0–15.0)
Lymphocytes Relative: 27.2 % (ref 12.0–46.0)
Lymphs Abs: 1.3 10*3/uL (ref 0.7–4.0)
MCHC: 32.6 g/dL (ref 30.0–36.0)
MCV: 92.3 fl (ref 78.0–100.0)
Monocytes Absolute: 0.3 10*3/uL (ref 0.1–1.0)
Monocytes Relative: 6.9 % (ref 3.0–12.0)
Neutro Abs: 3 10*3/uL (ref 1.4–7.7)
Neutrophils Relative %: 60.8 % (ref 43.0–77.0)
Platelets: 277 10*3/uL (ref 150.0–400.0)
RBC: 4.16 Mil/uL (ref 3.87–5.11)
RDW: 12.8 % (ref 11.5–15.5)
WBC: 4.9 10*3/uL (ref 4.0–10.5)

## 2019-11-20 LAB — LIPID PANEL
Cholesterol: 186 mg/dL (ref 0–200)
HDL: 58.8 mg/dL (ref >39.00)
LDL Cholesterol: 104 mg/dL — ABNORMAL HIGH (ref 0–99)
NonHDL: 126.94
Total CHOL/HDL Ratio: 3
Triglycerides: 117 mg/dL (ref 0.0–149.0)
VLDL: 23.4 mg/dL (ref 0.0–40.0)

## 2019-11-20 LAB — COMPREHENSIVE METABOLIC PANEL
ALT: 20 U/L (ref 0–35)
AST: 25 U/L (ref 0–37)
Albumin: 4.2 g/dL (ref 3.5–5.2)
Alkaline Phosphatase: 74 U/L (ref 39–117)
BUN: 26 mg/dL — ABNORMAL HIGH (ref 6–23)
CO2: 25 mEq/L (ref 19–32)
Calcium: 9.7 mg/dL (ref 8.4–10.5)
Chloride: 105 mEq/L (ref 96–112)
Creatinine, Ser: 1.39 mg/dL — ABNORMAL HIGH (ref 0.40–1.20)
GFR: 37.53 mL/min — ABNORMAL LOW (ref >60.00)
Glucose, Bld: 98 mg/dL (ref 70–99)
Potassium: 4.4 mEq/L (ref 3.5–5.1)
Sodium: 140 mEq/L (ref 135–145)
Total Bilirubin: 0.4 mg/dL (ref 0.2–1.2)
Total Protein: 7.4 g/dL (ref 6.0–8.3)

## 2019-11-20 LAB — VITAMIN D 25 HYDROXY (VIT D DEFICIENCY, FRACTURES): VITD: 78.91 ng/mL (ref 30.00–100.00)

## 2019-11-20 LAB — MICROALBUMIN / CREATININE URINE RATIO
Creatinine,U: 36 mg/dL
Microalb Creat Ratio: 5.4 mg/g (ref 0.0–30.0)
Microalb, Ur: 1.9 mg/dL (ref 0.0–1.9)

## 2019-11-20 LAB — TSH: TSH: 2.95 u[IU]/mL (ref 0.35–4.50)

## 2019-11-20 LAB — POCT GLYCOSYLATED HEMOGLOBIN (HGB A1C): Hemoglobin A1C: 6.7 % — AB (ref 4.0–5.6)

## 2019-11-20 LAB — VITAMIN B12: Vitamin B-12: >1500 pg/mL — ABNORMAL HIGH (ref 211–911)

## 2019-11-20 MED ORDER — ONETOUCH ULTRA VI STRP: ORAL_STRIP | 3 refills | Status: DC

## 2019-11-20 MED ORDER — FREESTYLE LIBRE READER DEVI: 1.0000 | Freq: Four times a day (QID) | 0 refills | Status: DC

## 2019-11-20 MED ORDER — BD LANCET ULTRAFINE 30G MISC: 1.0000 "application " | Freq: Four times a day (QID) | 3 refills | Status: DC

## 2019-11-20 NOTE — Patient Instructions (Addendum)
1) decrease your glipizide down to once/day  2) decrease your lantus down to 10 units. If fasting blood sugars are still less than 70 decrease insulin down to 8 and may have to decrease even more.   3) eat a bedtime snack like peanut butter cracker, etc. Before bed so we don't have to worry about your fasting blood sugars getting too low.   4) we may need to increase your premeal insulin if the 2 hour post meal blood sugars remain high or start a sliding scale. Call me in a month with this especially since you are walking more and have changed your diet so much.   IM SO PROUD OF YOU! Keep me posted on sugars in a month with all of these changes.  Will try to get the [facility] ordered for you as well.    Merry christmas!  Dr. Rogers Blocker

## 2019-11-20 NOTE — Progress Notes (Signed)
Patient: Wanda Medina MRN: [id number] DOB: 1951-05-31 PCP: Orma Flaming, MD     Subjective:  Chief Complaint  Patient presents with  . Annual Exam  . Hyperlipidemia  . Hypertension  . Diabetes    HPI: The patient is a 68 y.o. female who presents today for annual exam. She denies any changes to past medical history. There have been no recent hospitalizations. They are following a well balanced diet and exercise plan. Weight has been stable. No complaints today.   Diabetes: Patient is here for follow up of type 2 diabetes.   Currently on the following medications tradjenta, glipizide, novolog 5 units TID qAC and lantus 12 untis.. Takes medications as prescribed. Last A1C was 6.7. YEAH!!! Currently exercising and following diabetic diet. Sugars range from 54 to 110 fasting, qac: 70-250 and 2 hours PP: 90-300. Had a few fasting  hypoglycemic events in am and decreased her lantus dosing down. She was on 16 units and is now down to 12 units. Denies any vision changes, nausea, vomiting, abdominal pain, ulcers/paraesthesia in feet, polyuria, polydipsia or polyphagia. Denies any chest pain, shortness of breath. I also had her meet with nutritionist and she has drastically changed her diet.  She also got a treadmill and she is up to one mile/day. She has lost 10 pounds in one year.   Hypertension: Here for follow up of hypertension.  Currently on norvasc, losartan . Marland Kitchen Takes medication as prescribed and denies any side effects. Exercise includes walking. Weight has been stable. Denies any chest pain, headaches, shortness of breath, vision changes, swelling in lower extremities.   b12 deficiency: due for labs Osteopenia: due for DEXA  Hyperlipidemia: on crestor 13m with no issues. Takes as prescribed.   Immunization History  Administered Date(s) Administered  . Influenza, High Dose Seasonal PF 12/18/2018  . Influenza-Unspecified 10/21/2019  . Pneumococcal Polysaccharide-23 10/27/2018  . Zoster  Recombinat (Shingrix) 08/24/2019   Colonoscopy: 09/16/2017. F/u in 10 year.  Mammogram: 05/2019 Pap smear: >65 years. No longer indicated but last had in 6/19 and normal with negative hpv.  Dexa: due for this.   -getting 2nd shingrix today    Review of Systems  Constitutional: Negative for chills, fatigue and fever.  HENT: Negative for congestion, dental problem, ear pain, hearing loss, postnasal drip, rhinorrhea, sore throat and trouble swallowing.   Eyes: Negative for visual disturbance.  Respiratory: Negative for cough, chest tightness and shortness of breath.   Cardiovascular: Negative for chest pain, palpitations and leg swelling.  Gastrointestinal: Negative for abdominal pain, blood in stool, constipation, diarrhea, nausea and vomiting.  Endocrine: Negative for cold intolerance, heat intolerance, polydipsia, polyphagia and polyuria.  Genitourinary: Negative for dysuria, frequency, hematuria and urgency.  Musculoskeletal: Negative for arthralgias.  Skin: Negative for rash.  Neurological: Negative for dizziness and headaches.  Psychiatric/Behavioral: Negative for dysphoric mood and sleep disturbance. The patient is not nervous/anxious.     Allergies Patient is allergic to ace inhibitors; altace [ramipril]; and lisinopril.  Past Medical History Patient  has a past medical history of Allergy, Arthritis, Chronic kidney disease, Diabetes mellitus without complication [place], GERD (gastroesophageal reflux disease), Hyperlipemia, and Hypertension.  Surgical History Patient  has a past surgical history that includes Medial partial knee replacement; Colonoscopy; Trigger finger release; and Appendectomy.  Family History Pateint's family history includes Arthritis in her mother; COPD in her father and mother; Heart attack in her brother, father, and mother.  Social History Patient  reports that she has never smoked. She has never  used smokeless tobacco. She reports that she does not  drink alcohol or use drugs.    Objective: Vitals:   11/20/19 0935  BP: 135/90  Pulse: 95  Temp: (!) 97.5 F (36.4 C)  TempSrc: Skin  SpO2: 98%  Weight: 131 lb 3.2 oz (59.5 kg)  Height: 5' 1" (1.549 m)    Body mass index is 24.79 kg/m.  Physical Exam Vitals reviewed.  Constitutional:      Appearance: Normal appearance. She is well-developed and normal weight.  HENT:     Head: Normocephalic and atraumatic.     Right Ear: Tympanic membrane, ear canal and external ear normal.     Left Ear: Tympanic membrane, ear canal and external ear normal.     Mouth/Throat:     Mouth: Mucous membranes are moist.  Eyes:     Conjunctiva/sclera: Conjunctivae normal.     Pupils: Pupils are equal, round, and reactive to light.  Neck:     Thyroid: No thyromegaly.  Cardiovascular:     Rate and Rhythm: Normal rate and regular rhythm.     Pulses: Normal pulses.     Heart sounds: Normal heart sounds. No murmur.  Pulmonary:     Effort: Pulmonary effort is normal.     Breath sounds: Normal breath sounds.  Abdominal:     General: Abdomen is flat. Bowel sounds are normal. There is no distension.     Palpations: Abdomen is soft.     Tenderness: There is no abdominal tenderness.  Musculoskeletal:     Cervical back: Normal range of motion and neck supple.     Comments: Knee pain and slight limp  Lymphadenopathy:     Cervical: No cervical adenopathy.  Skin:    General: Skin is warm and dry.     Capillary Refill: Capillary refill takes less than 2 seconds.     Findings: No rash.  Neurological:     General: No focal deficit present.     Mental Status: She is alert and oriented to person, place, and time.     Cranial Nerves: No cranial nerve deficit.     Coordination: Coordination normal.     Deep Tendon Reflexes: Reflexes normal.  Psychiatric:        Mood and Affect: Mood normal.        Behavior: Behavior normal.        Depression screen [facility] 2/9 11/20/2019 08/21/2019 10/27/2018  Decreased  Interest 0 0 0  Down, Depressed, Hopeless 0 0 0  PHQ - 2 Score 0 0 0   Fall Risk  11/20/2019  Falls in the past year? 0  Number falls in past yr: 0  Injury with Fall? 0     Assessment/plan: 1. Annual physical exam Routine labs, HM updated. She is getting second shingrix today. DEXA ordered. Eye referral done. Really proud of her lifestyle changes. Continue exercise/diet. F/u in one year or as needed.  Patient counseling [x]   Nutrition: Stressed importance of moderation in sodium/caffeine intake, saturated fat and cholesterol, caloric balance, sufficient intake of fresh fruits, vegetables, fiber, calcium, iron, and 1 mg of folate supplement per day (for females capable of pregnancy).  [x]   Stressed the importance of regular exercise.   []   Substance Abuse: Discussed cessation/primary prevention of tobacco, alcohol, or other drug use; driving or other dangerous activities under the influence; availability of treatment for abuse.   [x]   Injury prevention: Discussed safety belts, safety helmets, smoke detector, smoking near bedding  or upholstery.   [x]   Sexuality: Discussed sexually transmitted diseases, partner selection, use of condoms, avoidance of unintended pregnancy  and contraceptive alternatives.  [x]   Dental health: Discussed importance of regular tooth brushing, flossing, and dental visits.  [x]   Health maintenance and immunizations reviewed. Please refer to Health maintenance section.     2. Osteopenia, unspecified location  - VITAMIN D 25 Hydroxy (Vit-D Deficiency, Fractures) - DG Bone Density; Future  3. Type 2 diabetes mellitus with stage 3 chronic kidney disease, with long-term current use of insulin ([place]) -follow GFR. No more nsaids and drugs have been adjusted based on creatinine clearance. Referral to eye doctor and routine lab work today. utd on vaccines, on statin. A1C TO GOAL! Decreasing her lantus down to 10 units and want her to start a bedtime snack to help  prevent hypo events and decreasing her glipizide down to once/day. Also ordering a freestyle [employer]. May need to put her on a sliding scale after meals vs. Increasing her novolog, but she has had some low premeal readings. Really proud of her. Will see her back in 3 months, but will call me in a month with readings. Also want her to decrease her basal insulin if her fasting blood sugars continue to be low. Hypoglycemic precautions given.  - Ambulatory referral to Ophthalmology - TSH - Lipid panel - Microalbumin / creatinine urine ratio - Comprehensive metabolic panel - CBC with Differential/Platelet - POCT glycosylated hemoglobin (Hb A1C)  4. Benign essential HTN Blood pressure is to goal. Continue current anti-hypertensive medications. Refills given and routine lab work will be done today. Recommended routine exercise and healthy diet including DASH diet and mediterranean diet. Encouraged weight loss. F/u in 6 months.   5. Mixed hyperlipidemia Continue statin with diabetes.   6. B12 deficiency  - Vitamin B12    This visit occurred during the SARS-CoV-2 public health emergency.  Safety protocols were in place, including screening questions prior to the visit, additional usage of staff PPE, and extensive cleaning of exam room while observing appropriate contact time as indicated for disinfecting solutions.     No follow-ups on file.     Orma Flaming, MD [place]  11/20/2019

## 2020-01-26 ENCOUNTER — Telehealth: Payer: Self-pay | Admitting: Family Medicine

## 2020-01-26 NOTE — Telephone Encounter (Signed)
  LAST APPOINTMENT DATE: 11/20/2019   NEXT APPOINTMENT DATE:@3 /09/2020  MEDICATION:omeprazole (PRILOSEC) 20 MG capsule-glipiZIDE (GLUCOTROL) 5 MG tablet-amLODipine (NORVASC) 10 MG tablet-losartan (COZAAR) 100 MG tablet-gabapentin (NEURONTIN) 100 MG capsule-rosuvastatin (CRESTOR) 10 MG tablet-cyclobenzaprine (FLEXERIL) 5 MG tablet  [id number] [place] [place] - [address]  Comment: Patient has 1-2 days left.   **Let patient know to contact pharmacy at the end of the day to make sure medication is ready. **  ** Please notify patient to allow 48-72 hours to process**  **Encourage patient to contact the pharmacy for refills or they can request refills through [facility]**  CLINICAL FILLS OUT ALL BELOW:   LAST REFILL:  QTY:  REFILL DATE:    OTHER COMMENTS:    Okay for refill?  Please advise

## 2020-01-27 ENCOUNTER — Other Ambulatory Visit: Payer: Self-pay

## 2020-01-27 MED ORDER — AMLODIPINE BESYLATE 10 MG PO TABS: 10.0000 mg | ORAL_TABLET | Freq: Every day | ORAL | 1 refills | Status: DC

## 2020-01-27 MED ORDER — LOSARTAN POTASSIUM 100 MG PO TABS: ORAL_TABLET | ORAL | 1 refills | Status: DC

## 2020-01-27 MED ORDER — OMEPRAZOLE 20 MG PO CPDR: DELAYED_RELEASE_CAPSULE | ORAL | 1 refills | Status: DC

## 2020-01-27 MED ORDER — ROSUVASTATIN CALCIUM 10 MG PO TABS: 10.0000 mg | ORAL_TABLET | Freq: Every day | ORAL | 3 refills | Status: DC

## 2020-01-27 MED ORDER — GABAPENTIN 100 MG PO CAPS: 100.0000 mg | ORAL_CAPSULE | Freq: Two times a day (BID) | ORAL | 2 refills | Status: DC

## 2020-01-27 MED ORDER — CYCLOBENZAPRINE HCL 5 MG PO TABS: 5.0000 mg | ORAL_TABLET | Freq: Two times a day (BID) | ORAL | 1 refills | Status: DC

## 2020-01-27 NOTE — Progress Notes (Signed)
MEDICATION:omeprazole (PRILOSEC) 20 MG capsule-glipiZIDE (GLUCOTROL) 5 MG tablet-amLODipine (NORVASC) 10 MG tablet-losartan (COZAAR) 100 MG tablet-gabapentin (NEURONTIN) 100 MG capsule-rosuvastatin (CRESTOR) 10 MG tablet-cyclobenzaprine (FLEXERIL) 5 MG tablet

## 2020-01-27 NOTE — Telephone Encounter (Signed)
Sent to Pharmacy. 

## 2020-02-12 ENCOUNTER — Other Ambulatory Visit: Payer: BC Managed Care – PPO

## 2020-02-17 ENCOUNTER — Ambulatory Visit: Payer: BC Managed Care – PPO | Admitting: Family Medicine

## 2020-02-18 ENCOUNTER — Other Ambulatory Visit: Payer: Self-pay

## 2020-02-18 ENCOUNTER — Encounter: Payer: Self-pay | Admitting: Family Medicine

## 2020-02-18 ENCOUNTER — Telehealth: Payer: Self-pay | Admitting: Family Medicine

## 2020-02-18 ENCOUNTER — Ambulatory Visit (INDEPENDENT_AMBULATORY_CARE_PROVIDER_SITE_OTHER): Payer: BC Managed Care – PPO | Admitting: Family Medicine

## 2020-02-18 VITALS — BP 120/80 | HR 98 | Temp 97.2°F | Ht 61.0 in | Wt 132.6 lb

## 2020-02-18 DIAGNOSIS — E1121 Type 2 diabetes mellitus with diabetic nephropathy: Secondary | ICD-10-CM

## 2020-02-18 DIAGNOSIS — N1831 Chronic kidney disease, stage 3a: Secondary | ICD-10-CM

## 2020-02-18 DIAGNOSIS — I10 Essential (primary) hypertension: Secondary | ICD-10-CM

## 2020-02-18 LAB — COMPREHENSIVE METABOLIC PANEL
ALT: 19 U/L (ref 0–35)
AST: 22 U/L (ref 0–37)
Albumin: 4.1 g/dL (ref 3.5–5.2)
Alkaline Phosphatase: 72 U/L (ref 39–117)
BUN: 24 mg/dL — ABNORMAL HIGH (ref 6–23)
CO2: 27 mEq/L (ref 19–32)
Calcium: 9.5 mg/dL (ref 8.4–10.5)
Chloride: 104 mEq/L (ref 96–112)
Creatinine, Ser: 1.35 mg/dL — ABNORMAL HIGH (ref 0.40–1.20)
GFR: 38.78 mL/min — ABNORMAL LOW (ref >60.00)
Glucose, Bld: 122 mg/dL — ABNORMAL HIGH (ref 70–99)
Potassium: 4.5 mEq/L (ref 3.5–5.1)
Sodium: 138 mEq/L (ref 135–145)
Total Bilirubin: 0.4 mg/dL (ref 0.2–1.2)
Total Protein: 7.2 g/dL (ref 6.0–8.3)

## 2020-02-18 LAB — HEMOGLOBIN A1C: Hgb A1c MFr Bld: 7.6 % — ABNORMAL HIGH (ref 4.6–6.5)

## 2020-02-18 MED ORDER — SUCRALFATE 1 G PO TABS: 1.0000 g | ORAL_TABLET | Freq: Two times a day (BID) | ORAL | 1 refills | Status: DC

## 2020-02-18 MED ORDER — LANTUS SOLOSTAR 100 UNIT/ML ~~LOC~~ SOPN: 10.0000 [IU] | PEN_INJECTOR | Freq: Every day | SUBCUTANEOUS | 3 refills | Status: DC

## 2020-02-18 NOTE — Telephone Encounter (Signed)
Sent in for her.  Tiffanny Lamarche, MD [facility]   

## 2020-02-18 NOTE — Addendum Note (Signed)
Addended by: Orland Mustard on: 02/18/2020 03:35 PM   Modules accepted: Orders

## 2020-02-18 NOTE — Telephone Encounter (Signed)
Patient wanted to see if Dr. Artis Flock could prescribe Carafate for heartburn. She would like [facility] to  [id number] [place] [place] - [address] Phone:  [phone]  Fax:  [phone]

## 2020-02-18 NOTE — Progress Notes (Signed)
Patient: Wanda Medina MRN: [id number] DOB: [birth year] PCP: Orland Mustard, MD     Subjective:  Chief Complaint  Patient presents with  . Follow-up    DM     HPI: The patient is a 69 y.o. female who presents today for diabetes follow up.   Diabetes: Patient is here for follow up of type 2 diabetes.  Currently on the following medications tradjenta, glipizide, novolog 5 units premeal if >100 units and lantus 10 units.  Takes medications as prescribed. Last A1C was 6.7. Currently exercising and following diabetic diet. Sugars range from less than 100. If she doesn't eat a bed time snack she will have some low sugars to 50-60 around 10-11pm. Denies any vision changes, nausea, vomiting, abdominal pain, ulcers/paraesthesia in feet, polyuria, polydipsia or polyphagia. Denies any chest pain, shortness of breath.   Fasting blood sugar log: 66-152. Average around 100 2pm: 118-265 4pm: 107-263  She has had her covid vaccines.    Review of Systems  Constitutional: Negative for appetite change, fatigue and fever.  HENT: Negative for congestion, ear discharge, nosebleeds, sinus pain and sneezing.   Eyes: Negative for pain, discharge, redness and itching.  Respiratory: Negative for cough, chest tightness, shortness of breath and wheezing.   Cardiovascular: Negative for chest pain, palpitations and leg swelling.  Gastrointestinal: Negative for abdominal pain, blood in stool, diarrhea, nausea and vomiting.  Genitourinary: Negative for difficulty urinating and flank pain.  Neurological: Negative for dizziness, seizures, light-headedness, numbness and headaches.  Psychiatric/Behavioral: Negative for behavioral problems, confusion, hallucinations and suicidal ideas.   Pt present for Diabetes F/U taking medication as prescribed Taking glucose daily with results running from  Lowest 66 higher 270 fasting  Allergies Patient is allergic to ace inhibitors; altace [ramipril]; and lisinopril.  Past  Medical History Patient  has a past medical history of Allergy, Arthritis, Chronic kidney disease, Diabetes mellitus without complication (HCC), GERD (gastroesophageal reflux disease), Hyperlipemia, and Hypertension.  Surgical History Patient  has a past surgical history that includes Medial partial knee replacement; Colonoscopy; Trigger finger release; and Appendectomy.  Family History Pateint's family history includes Arthritis in her mother; COPD in her father and mother; Heart attack in her brother, father, and mother.  Social History Patient  reports that she has never smoked. She has never used smokeless tobacco. She reports that she does not drink alcohol or use drugs.    Objective: Vitals:   02/18/20 0807  BP: 120/80  Pulse: 98  Temp: (!) 97.2 F (36.2 C)  TempSrc: Temporal  SpO2: 98%  Weight: 132 lb 9.6 oz (60.1 kg)  Height: 5\' 1"  (1.549 m)    Body mass index is 25.05 kg/m.  Physical Exam Vitals reviewed.  Constitutional:      Appearance: Normal appearance. She is well-developed.  HENT:     Head: Normocephalic and atraumatic.     Right Ear: External ear normal.     Left Ear: External ear normal.  Eyes:     Extraocular Movements: Extraocular movements intact.     Conjunctiva/sclera: Conjunctivae normal.     Pupils: Pupils are equal, round, and reactive to light.  Neck:     Thyroid: No thyromegaly.     Vascular: No carotid bruit.  Cardiovascular:     Rate and Rhythm: Normal rate and regular rhythm.     Pulses: Normal pulses.     Heart sounds: Normal heart sounds. No murmur.  Pulmonary:     Effort: Pulmonary effort is normal.     Breath  sounds: Normal breath sounds.  Abdominal:     General: Abdomen is flat. Bowel sounds are normal. There is no distension.     Palpations: Abdomen is soft.     Tenderness: There is no abdominal tenderness.  Musculoskeletal:     Cervical back: Normal range of motion and neck supple.  Lymphadenopathy:     Cervical: No  cervical adenopathy.  Skin:    General: Skin is warm and dry.     Capillary Refill: Capillary refill takes less than 2 seconds.     Findings: No rash.  Neurological:     General: No focal deficit present.     Mental Status: She is alert and oriented to person, place, and time.     Cranial Nerves: No cranial nerve deficit.     Coordination: Coordination normal.     Deep Tendon Reflexes: Reflexes normal.  Psychiatric:        Mood and Affect: Mood normal.        Behavior: Behavior normal.       Diabetic Foot Exam - Simple   Simple Foot Form Diabetic Foot exam was performed with the following findings: Yes 02/18/2020  8:37 AM  Visual Inspection No deformities, no ulcerations, no other skin breakdown bilaterally: Yes Sensation Testing Intact to touch and monofilament testing bilaterally: Yes Pulse Check Posterior Tibialis and Dorsalis pulse intact bilaterally: Yes Comments Normal exam.       Office Visit from 02/18/2020 in [place]  PHQ-2 Total Score  0         Assessment/plan: 1. Type 2 diabetes mellitus with stage 3a chronic kidney disease, without long-term current use of insulin (HCC) Fasting sugars are to goal and have decreased her long acting insulin due to hypoevents. Done better on 10 units. Will continue all medication for now and adjust once I have her a1c back. Continue diet/exercise. Foot exam normal today. F/u in 3-6 months. Will call with management once I have her a1c back.  - Hemoglobin A1c - Comprehensive metabolic panel - Hemoglobin A1c; Future  2. Benign essential HTN Future ordered labs.  - CBC with Differential/Platelet; Future - Comprehensive metabolic panel; Future   This visit occurred during the SARS-CoV-2 public health emergency.  Safety protocols were in place, including screening questions prior to the visit, additional usage of staff PPE, and extensive cleaning of exam room while observing appropriate contact time as  indicated for disinfecting solutions.     Return in about 3 months (around 05/20/2020) for htn/diabetes. lab before. Orma Flaming, MD [place]   02/18/2020

## 2020-02-18 NOTE — Telephone Encounter (Signed)
Called patient she is currently taking Nexium. She has a history of ulcer and is having similar symptoms now. She has had Carafate in the past and it helped.

## 2020-02-18 NOTE — Patient Instructions (Addendum)
SGLT2 inhibitor. Call insurance and see what other drugs they prefer over jardiance. Let me know and i'll send this in for you.    lantus lets decrease to 8 units and see if sugars are still to goal around 70-100.   Keep up the plan for now until I get your labs back.    Ill be in touch!  Dr. Artis Flock

## 2020-02-18 NOTE — Telephone Encounter (Signed)
Let her know this is more for a duodenal ulcer, not for reflux. Has she tried other medication like pepcid, prevacid, nexium, etc?   Orland Mustard, MD [facility]

## 2020-02-19 ENCOUNTER — Other Ambulatory Visit: Payer: Self-pay | Admitting: Family Medicine

## 2020-02-25 ENCOUNTER — Telehealth: Payer: Self-pay

## 2020-02-25 NOTE — Telephone Encounter (Signed)
Patient requesting call back regarding medication.patient would not provide any other information and would like for someone to call her back today

## 2020-02-26 ENCOUNTER — Telehealth: Payer: Self-pay | Admitting: Family Medicine

## 2020-02-26 NOTE — Telephone Encounter (Signed)
Wanda Medina called regarding her glipizide. She wants to know if she can take her glipizide together at lunch time. She is on 5mg  BID. Her sugars are low in the Am and she would rather take at lunch. im okay with her taking her glipizide at lunch together (10mg ) since IR. Hypoglycemic precautions given.  , MD [facility]

## 2020-04-06 ENCOUNTER — Other Ambulatory Visit: Payer: Self-pay | Admitting: Family Medicine

## 2020-04-12 ENCOUNTER — Telehealth: Payer: Self-pay | Admitting: Family Medicine

## 2020-04-12 NOTE — Telephone Encounter (Signed)
PA done today, waiting on results.  Thank You

## 2020-04-12 NOTE — Telephone Encounter (Signed)
Pharmacy is calling stating a PA needs to be processed on glipizide 5mg .   Is requesting office to reach out to patients insurance.

## 2020-04-22 ENCOUNTER — Telehealth: Payer: Self-pay

## 2020-04-22 NOTE — Telephone Encounter (Signed)
PA for -Glipizide has been approved 04/12/2020-04/22/2021. Ref# [id number] Patient and pharmacy has been contacted.

## 2020-04-22 NOTE — Telephone Encounter (Signed)
Shanda Bumps from [facility] calling about Prior Authorization. Please follow up with rep. Best contact number is [phone].

## 2020-05-02 ENCOUNTER — Ambulatory Visit
Admission: RE | Admit: 2020-05-02 | Discharge: 2020-05-02 | Disposition: A | Discharge status: Home / Self Care | Payer: BC Managed Care – PPO | Source: Ambulatory Visit | Attending: Family Medicine | Admitting: Family Medicine

## 2020-05-02 ENCOUNTER — Other Ambulatory Visit: Payer: Self-pay

## 2020-05-02 ENCOUNTER — Other Ambulatory Visit: Payer: Self-pay | Admitting: Family Medicine

## 2020-05-02 DIAGNOSIS — M8580 Other specified disorders of bone density and structure, unspecified site: Secondary | ICD-10-CM

## 2020-05-03 ENCOUNTER — Other Ambulatory Visit: Payer: Self-pay | Admitting: Family Medicine

## 2020-05-18 ENCOUNTER — Other Ambulatory Visit (INDEPENDENT_AMBULATORY_CARE_PROVIDER_SITE_OTHER): Payer: BC Managed Care – PPO

## 2020-05-18 ENCOUNTER — Other Ambulatory Visit: Payer: Self-pay

## 2020-05-18 DIAGNOSIS — E1121 Type 2 diabetes mellitus with diabetic nephropathy: Secondary | ICD-10-CM | POA: Diagnosis not present

## 2020-05-18 DIAGNOSIS — E1122 Type 2 diabetes mellitus with diabetic chronic kidney disease: Secondary | ICD-10-CM

## 2020-05-18 DIAGNOSIS — I10 Essential (primary) hypertension: Secondary | ICD-10-CM

## 2020-05-18 DIAGNOSIS — N1831 Chronic kidney disease, stage 3a: Secondary | ICD-10-CM | POA: Diagnosis not present

## 2020-05-18 LAB — CBC WITH DIFFERENTIAL/PLATELET
Basophils Absolute: 0 10*3/uL (ref 0.0–0.1)
Basophils Relative: 0.8 % (ref 0.0–3.0)
Eosinophils Absolute: 0.3 10*3/uL (ref 0.0–0.7)
Eosinophils Relative: 4.6 % (ref 0.0–5.0)
HCT: 35.4 % — ABNORMAL LOW (ref 36.0–46.0)
Hemoglobin: 11.9 g/dL — ABNORMAL LOW (ref 12.0–15.0)
Lymphocytes Relative: 27.7 % (ref 12.0–46.0)
Lymphs Abs: 1.5 10*3/uL (ref 0.7–4.0)
MCHC: 33.7 g/dL (ref 30.0–36.0)
MCV: 90.9 fl (ref 78.0–100.0)
Monocytes Absolute: 0.5 10*3/uL (ref 0.1–1.0)
Monocytes Relative: 8.5 % (ref 3.0–12.0)
Neutro Abs: 3.2 10*3/uL (ref 1.4–7.7)
Neutrophils Relative %: 58.4 % (ref 43.0–77.0)
Platelets: 231 10*3/uL (ref 150.0–400.0)
RBC: 3.89 Mil/uL (ref 3.87–5.11)
RDW: 13 % (ref 11.5–15.5)
WBC: 5.4 10*3/uL (ref 4.0–10.5)

## 2020-05-18 LAB — HEMOGLOBIN A1C: Hgb A1c MFr Bld: 7.3 % — ABNORMAL HIGH (ref 4.6–6.5)

## 2020-05-18 LAB — COMPREHENSIVE METABOLIC PANEL
ALT: 18 U/L (ref 0–35)
AST: 21 U/L (ref 0–37)
Albumin: 4.1 g/dL (ref 3.5–5.2)
Alkaline Phosphatase: 64 U/L (ref 39–117)
BUN: 27 mg/dL — ABNORMAL HIGH (ref 6–23)
CO2: 27 mEq/L (ref 19–32)
Calcium: 9.4 mg/dL (ref 8.4–10.5)
Chloride: 102 mEq/L (ref 96–112)
Creatinine, Ser: 1.35 mg/dL — ABNORMAL HIGH (ref 0.40–1.20)
GFR: 38.76 mL/min — ABNORMAL LOW (ref >60.00)
Glucose, Bld: 113 mg/dL — ABNORMAL HIGH (ref 70–99)
Potassium: 4.6 mEq/L (ref 3.5–5.1)
Sodium: 137 mEq/L (ref 135–145)
Total Bilirubin: 0.4 mg/dL (ref 0.2–1.2)
Total Protein: 6.9 g/dL (ref 6.0–8.3)

## 2020-05-20 ENCOUNTER — Encounter: Payer: Self-pay | Admitting: Family Medicine

## 2020-05-20 ENCOUNTER — Ambulatory Visit (INDEPENDENT_AMBULATORY_CARE_PROVIDER_SITE_OTHER)
Admission: RE | Admit: 2020-05-20 | Discharge: 2020-05-20 | Disposition: A | Discharge status: Home / Self Care | Payer: BC Managed Care – PPO | Source: Ambulatory Visit | Attending: Family Medicine | Admitting: Family Medicine

## 2020-05-20 ENCOUNTER — Telehealth: Payer: Self-pay | Admitting: Family Medicine

## 2020-05-20 ENCOUNTER — Other Ambulatory Visit: Payer: Self-pay

## 2020-05-20 ENCOUNTER — Ambulatory Visit: Payer: BC Managed Care – PPO | Admitting: Family Medicine

## 2020-05-20 VITALS — BP 120/76 | HR 98 | Temp 98.0°F | Ht 61.0 in | Wt 134.0 lb

## 2020-05-20 DIAGNOSIS — I10 Essential (primary) hypertension: Secondary | ICD-10-CM

## 2020-05-20 DIAGNOSIS — N1831 Chronic kidney disease, stage 3a: Secondary | ICD-10-CM | POA: Diagnosis not present

## 2020-05-20 DIAGNOSIS — M25562 Pain in left knee: Secondary | ICD-10-CM | POA: Diagnosis not present

## 2020-05-20 DIAGNOSIS — M25561 Pain in right knee: Secondary | ICD-10-CM

## 2020-05-20 DIAGNOSIS — E1121 Type 2 diabetes mellitus with diabetic nephropathy: Secondary | ICD-10-CM | POA: Diagnosis not present

## 2020-05-20 DIAGNOSIS — Z794 Long term (current) use of insulin: Secondary | ICD-10-CM

## 2020-05-20 NOTE — Telephone Encounter (Signed)
Son calling back about xray.

## 2020-05-20 NOTE — Progress Notes (Signed)
Patient: Wanda Medina MRN: [id number] DOB: 05/12/1951 PCP: Orma Flaming, MD     Subjective:  Chief Complaint  Patient presents with  . Hypertension  . Diabetes  . Knee Pain    HPI: The patient is a 69 y.o. female who presents today for Hypertension and Diabetes. Pt also had a fall yesterday in [employer]. She fell on both knees. She states having knee replacement and is requesting an xray just to make sure she hasn't injured them. She complains of pain in both.  Diabetes  Patient is here for follow up of type 2 diabetes.  Currently on the following medications tradjenta, glipizide 5mg  BID, novolog 6 units premeal if >100 units and lantus 8 units.  Takes medications as prescribed. Last A1C was 7.3. Currently exercising and following diabetic diet. Fasting Sugars range from 60- 130. If she doesn't eat a bed time snack she will have some low sugars to 50-60. post prandial: 150-300. Denies any vision changes, nausea, vomiting, abdominal pain, ulcers/paraesthesia in feet, polyuria, polydipsia or polyphagia. Denies any chest pain, shortness of breath.  -a1c increased to 7.6 3 months ago so we increased her glipizide back to 5mg  BID these past 3 months.  -she was having some low fasting sugars so dropped her lantus down to 8 units. All fasting have been to goal with none under 60.  -biggest meal is her lunch and her sugars are quite high after this. Only on 6 units of novolog.   Bilateral knee pain She was in [employer] and was walking and fell on both of her knees yesterday.  Three people helped her up. She had swelling in her right knee (replacement). She has pain only if she presses on the knee. She can walk, but she does have a limp. It is better today then yesterday. Her daughter is making her use a cane. She took ibuprofen x1 and tylenol.   Hypertension:  Here for follow up of hypertension.  Currently on norvasc 10mg , cozaar 100mg  .  Takes medication as prescribed and denies any  side effects. Exercise includes walking. Weight has been stable. Denies any chest pain, headaches, shortness of breath, vision changes, swelling in lower extremities.    Review of Systems  Constitutional: Negative for chills, fatigue and fever.  Respiratory: Negative for cough, shortness of breath and wheezing.   Cardiovascular: Negative for chest pain, palpitations and leg swelling.  Gastrointestinal: Negative for abdominal pain, nausea and vomiting.  Endocrine: Negative for cold intolerance, heat intolerance, polydipsia, polyphagia and polyuria.  Musculoskeletal: Positive for arthralgias (bilateral knee pain ). Negative for back pain and joint swelling.  Neurological: Negative for dizziness, light-headedness and numbness.  Psychiatric/Behavioral: Negative for dysphoric mood and sleep disturbance.    Allergies Patient is allergic to ace inhibitors, altace [ramipril], and lisinopril.  Past Medical History Patient  has a past medical history of Allergy, Arthritis, Chronic kidney disease, Diabetes mellitus without complication ([place]), GERD (gastroesophageal reflux disease), Hyperlipemia, and Hypertension.  Surgical History Patient  has a past surgical history that includes Medial partial knee replacement; Colonoscopy; Trigger finger release; and Appendectomy.  Family History Pateint's family history includes Arthritis in her mother; COPD in her father and mother; Heart attack in her brother, father, and mother.  Social History Patient  reports that she has never smoked. She has never used smokeless tobacco. She reports that she does not drink alcohol and does not use drugs.    Objective: Vitals:   05/20/20 0909  BP: 120/76  Pulse: 98  Temp: 98 F (36.7 C)  TempSrc: Temporal  SpO2: 98%  Weight: 134 lb (60.8 kg)  Height: 5\' 1"  (1.549 m)    Body mass index is 25.32 kg/m.  Physical Exam Vitals reviewed.  Constitutional:      Appearance: Normal appearance. She is obese.   HENT:     Head: Normocephalic and atraumatic.  Cardiovascular:     Rate and Rhythm: Normal rate and regular rhythm.     Heart sounds: Normal heart sounds.  Pulmonary:     Effort: Pulmonary effort is normal.     Breath sounds: Normal breath sounds.  Abdominal:     General: Bowel sounds are normal.     Palpations: Abdomen is soft.  Musculoskeletal:     Cervical back: Normal range of motion and neck supple.     Comments: Bilateral knee exam -no edema/erythema -point TTP over bilateral patellas at inferior aspect/superior aspect of tibia.  -full extension and flexion.+crepitus -negative draw sign and negative mcmurray sign. No pain with valgus or varus strain.   Neurological:     General: No focal deficit present.     Mental Status: She is alert and oriented to person, place, and time.  Psychiatric:        Mood and Affect: Mood normal.        Behavior: Behavior normal.    bilateral knee xrays: pending. Has to go to different clinic.      Assessment/plan: 1. Type 2 diabetes mellitus with stage 3a chronic kidney disease, with long-term current use of insulin (HCC) a1c improved ot 7.3 which I am happy with at her age. She does have high post prandial after lunch since this is her big meal of day and I want to increase her novolog to 8 units, possibly more. She is going to titrate up by one unit. Would like her around 120.   -keep lantus at 8 units and again want her eating a bedtime snack to prevent hypo events in the AM.  -continue tradjenta and glipizide.  -on arb/statin.  -foot exam utd.  -eye exam pending -vaccines utd.  -f/u in 3 months time.   2. Benign essential HTN Blood pressure is to goal. Continue current anti-hypertensive medications. Refills not needed and routine lab work reviewed today. Recommended routine exercise and healthy diet including DASH diet and mediterranean diet. Encouraged weight loss. F/u in 6 months.    3. Acute pain of both knees No red flags on  exam but with age and osteopenia will send for xray. Want her to use voltaren gel and continue to ice. No ibuprofen/oral nsaids with CKD. Will f/u on xray and call son as requested.  - DG Knee Complete 4 Views Left; Future - DG Knee Complete 4 Views Right; Future  -daughter is with her today who is an . They do not have a DPR on file and will fill out today so I can call son/other daughters or husband if needed.   This visit occurred during the SARS-CoV-2 public health emergency.  Safety protocols were in place, including screening questions prior to the visit, additional usage of staff PPE, and extensive cleaning of exam room while observing appropriate contact time as indicated for disinfecting solutions.     Return in about 3 months (around 08/20/2020) for diabetes only. you don't have to fast! .   10/20/2020, MD [place] [facility]   05/20/2020

## 2020-05-20 NOTE — Patient Instructions (Signed)
I have ordered xrays for you. At this time we do not have xrays in our clinic. You will have to go to our [facility]. The address is [address]  xray is located in the basement.  Hours of operation are M-F 8:30am to 5:00pm.  Closed for lunch between 12:30 and 1:00pm.   

## 2020-05-23 NOTE — Telephone Encounter (Signed)
Have called son multiple times. VM is full.  Orland Mustard, MD [place] [facility]

## 2020-05-27 ENCOUNTER — Other Ambulatory Visit: Payer: Self-pay | Admitting: Family Medicine

## 2020-06-03 ENCOUNTER — Other Ambulatory Visit: Payer: Self-pay | Admitting: Family Medicine

## 2020-06-30 ENCOUNTER — Other Ambulatory Visit: Payer: Self-pay

## 2020-06-30 ENCOUNTER — Ambulatory Visit
Admission: RE | Admit: 2020-06-30 | Discharge: 2020-06-30 | Disposition: A | Discharge status: Home / Self Care | Payer: BC Managed Care – PPO | Source: Ambulatory Visit | Attending: Family Medicine | Admitting: Family Medicine

## 2020-06-30 ENCOUNTER — Ambulatory Visit (INDEPENDENT_AMBULATORY_CARE_PROVIDER_SITE_OTHER): Payer: BC Managed Care – PPO

## 2020-06-30 VITALS — BP 126/87 | HR 104 | Temp 99.0°F | Resp 18

## 2020-06-30 DIAGNOSIS — B9789 Other viral agents as the cause of diseases classified elsewhere: Secondary | ICD-10-CM | POA: Diagnosis not present

## 2020-06-30 DIAGNOSIS — J988 Other specified respiratory disorders: Secondary | ICD-10-CM | POA: Diagnosis not present

## 2020-06-30 DIAGNOSIS — Z20822 Contact with and (suspected) exposure to covid-19: Secondary | ICD-10-CM

## 2020-06-30 DIAGNOSIS — J3489 Other specified disorders of nose and nasal sinuses: Secondary | ICD-10-CM | POA: Diagnosis not present

## 2020-06-30 DIAGNOSIS — R05 Cough: Secondary | ICD-10-CM

## 2020-06-30 NOTE — ED Provider Notes (Signed)
[facility]    CSN: [id number] Arrival date & time: 06/30/20  1758      History   Chief Complaint Chief Complaint  Patient presents with  . URI    HPI Wanda Medina is a 69 y.o. female.   Patient presents with upper respiratory congestion and sinus pressure x2 days after being had a large family gathering over the weekend, 4 days ago.  Gathering involved air travel to get there and return home.  Several children were ill at the gathering.  She is vaccinated in all members of the family gathering were likewise vaccinated.  She has had fever as high as 102.  She denies chills or rigors.  Cough is described as dry nonproductive  HPI  Past Medical History:  Diagnosis Date  . Allergy   . Arthritis   . Chronic kidney disease    AKI saw Nephrologist in [place]  . Diabetes mellitus without complication (HCC)   . GERD (gastroesophageal reflux disease)   . Hyperlipemia   . Hypertension     Patient Active Problem List   Diagnosis Date Noted  . Type 2 diabetes mellitus with diabetic chronic kidney disease (HCC) 05/18/2019  . B12 deficiency 11/20/2018  . Osteoarthritis of knee, unspecified 11/12/2018  . Osteopenia 11/12/2018  . Benign essential HTN 10/27/2018  . GERD (gastroesophageal reflux disease) 10/27/2018  . Hyperlipidemia 10/27/2018  . Arthritis 10/27/2018  . Allergic rhinitis 10/27/2018    Past Surgical History:  Procedure Laterality Date  . APPENDECTOMY    . COLONOSCOPY    . MEDIAL PARTIAL KNEE REPLACEMENT     Rt. knee in 05/2016  . TRIGGER FINGER RELEASE     both hands    OB History   No obstetric history on file.      Home Medications    Prior to Admission medications   Medication Sig Start Date End Date Taking? Authorizing Provider  amLODipine (NORVASC) 10 MG tablet Take 1 tablet (10 mg total) by mouth daily. 01/27/20   Orland Mustard, MD  BD PEN NEEDLE NANO U/F 32G X 4 MM MISC USE 1 NEEDLE TO INJECT SUBCUTANEOUSLY EVERY  NIGHT 04/30/19   Orland Mustard, MD  calcium-vitamin D (OSCAL WITH D) 500-200 MG-UNIT tablet Take 1 tablet by mouth.    [provider]  Continuous Blood Gluc Receiver (FREESTYLE LIBRE READER) DEVI 1 Device by Does not apply route 4 (four) times daily. 11/20/19   Orland Mustard, MD  cyclobenzaprine (FLEXERIL) 5 MG tablet Take 1 tablet (5 mg total) by mouth 2 (two) times daily. 01/27/20   Orland Mustard, MD  ferrous sulfate 324 (65 Fe) MG TBEC Take by mouth.    [provider]  gabapentin (NEURONTIN) 100 MG capsule Take 1 capsule (100 mg total) by mouth 2 (two) times daily. 01/27/20   Orland Mustard, MD  glipiZIDE (GLUCOTROL) 5 MG tablet Take 1 tablet (5 mg total) by mouth 2 (two) times daily before a meal. 02/19/20   Orland Mustard, MD  glucose blood [facility] ULTRA) test strip Test 4x/day 11/20/19   Orland Mustard, MD  insulin glargine (LANTUS SOLOSTAR) 100 UNIT/ML Solostar Pen Inject 10 Units into the skin at bedtime. 02/18/20   Orland Mustard, MD  insulin lispro (HUMALOG KWIKPEN) 100 UNIT/ML KwikPen Inject 0.05 mLs (5 Units total) into the skin 3 (three) times daily. 08/26/19   Orland Mustard, MD  Lancets (BD LANCET ULTRAFINE 30G) MISC 1 application by Does not apply route 4 (four) times daily. 11/20/19  Orland Mustard, MD  losartan (COZAAR) 100 MG tablet TAKE 1 TABLET BY MOUTH EVERY DAY IN THE EVENING 01/27/20   Orland Mustard, MD  mometasone (NASONEX) 50 MCG/ACT nasal spray Place into the nose.    [provider]  montelukast (SINGULAIR) 10 MG tablet TAKE 1 TABLET BY MOUTH EVERYDAY AT BEDTIME 06/03/20   Orland Mustard, MD  omeprazole (PRILOSEC) 20 MG capsule TAKE 1 CAPSULE BY MOUTH EVERY DAY 01/27/20   Orland Mustard, MD  rosuvastatin (CRESTOR) 10 MG tablet Take 1 tablet (10 mg total) by mouth daily. 01/27/20   Orland Mustard, MD  sucralfate (CARAFATE) 1 g tablet TAKE 1 TABLET (1 G TOTAL) BY MOUTH 2 (TWO) TIMES DAILY. 05/27/20   Orland Mustard, MD  TRADJENTA 5 MG TABS tablet  TAKE 1 TABLET BY MOUTH EVERY DAY 05/02/20   Orland Mustard, MD  vitamin B-12 (CYANOCOBALAMIN) 500 MCG tablet Take 1,000 mcg by mouth daily.     [provider]  zinc gluconate 50 MG tablet Take 50 mg by mouth daily.    [provider]    Family History Family History  Problem Relation Age of Onset  . Arthritis Mother   . COPD Mother   . Heart attack Mother   . COPD Father   . Heart attack Father   . Heart attack Brother   . Colon cancer Neg Hx   . Colon polyps Neg Hx   . Esophageal cancer Neg Hx   . Rectal cancer Neg Hx   . Stomach cancer Neg Hx     Social History Social History   Tobacco Use  . Smoking status: Never Smoker  . Smokeless tobacco: Never Used  Vaping Use  . Vaping Use: Never used  Substance Use Topics  . Alcohol use: No  . Drug use: No     Allergies   Ace inhibitors, Altace [ramipril], and Lisinopril   Review of Systems Review of Systems  Constitutional: Positive for fever.  HENT: Positive for congestion and sinus pain.   Respiratory: Positive for cough.   Cardiovascular: Negative for chest pain.     Physical Exam Triage Vital Signs ED Triage Vitals [06/30/20 1824]  Enc Vitals Group     BP (!) 126/87     Pulse Rate 104     Resp 18     Temp 99 F (37.2 C)     Temp Source Oral     SpO2 97 %     Weight      Height      Head Circumference      Peak Flow      Pain Score 8     Pain Loc      Pain Edu?      Excl. in GC?    No data found.  Updated Vital Signs BP (!) 126/87 (BP Location: Left Arm)   Pulse 104   Temp 99 F (37.2 C) (Oral)   Resp 18   LMP  (LMP Unknown)   SpO2 97%   Visual Acuity Right Eye Distance:   Left Eye Distance:   Bilateral Distance:    Right Eye Near:   Left Eye Near:    Bilateral Near:     Physical Exam Vitals and nursing note reviewed.  Constitutional:      Appearance: Normal appearance. She is diaphoretic. She is not ill-appearing or toxic-appearing.  HENT:     Head:  Normocephalic.     Left Ear: Tympanic membrane normal.  Mouth/Throat:     Mouth: Mucous membranes are moist.  Cardiovascular:     Rate and Rhythm: Normal rate and regular rhythm.  Pulmonary:     Effort: Pulmonary effort is normal.     Breath sounds: Rales present.  Neurological:     General: No focal deficit present.     Mental Status: She is alert and oriented to person, place, and time.      UC Treatments / Results  Labs (all labs ordered are listed, but only abnormal results are displayed) Labs Reviewed - No data to display  EKG   Radiology No results found.  Procedures Procedures (including critical care time)  Medications Ordered in UC Medications - No data to display  Initial Impression / Assessment and Plan / UC Course  I have reviewed the triage vital signs and the nursing notes.  Pertinent labs & imaging results that were available during my care of the patient were reviewed by me and considered in my medical decision making (see chart for details).     Viral respiratory infection.  Chest x-ray negative for pneumonia.  Covid test is pending.  Treat symptoms with antipyretics and cough medicine of choice Final Clinical Impressions(s) / UC Diagnoses   Final diagnoses:  None   Discharge Instructions   None    ED Prescriptions    None     PDMP not reviewed this encounter.   Frederica Kuster, MD 06/30/20 [phone]

## 2020-06-30 NOTE — ED Triage Notes (Signed)
Pt presents to [facility] for assessment of cough, stuffy nose, sinus pressure x 2 days after being around a large group of family this past weekend, and several children present were sick

## 2020-07-02 LAB — SARS-COV-2, NAA 2 DAY TAT

## 2020-07-02 LAB — NOVEL CORONAVIRUS, NAA: SARS-CoV-2, NAA: NOT DETECTED

## 2020-07-07 ENCOUNTER — Other Ambulatory Visit: Payer: Self-pay | Admitting: Family Medicine

## 2020-07-08 ENCOUNTER — Telehealth: Payer: Self-pay

## 2020-07-08 NOTE — Telephone Encounter (Signed)
..   LAST APPOINTMENT DATE: 07/07/2020   NEXT APPOINTMENT DATE:@9 /13/2021  MEDICATION:amLODipine (NORVASC) 10 MG tablet  MEDICATION:cyclobenzaprine (FLEXERIL) 5 MG tablet  MEDICATION:omeprazole (PRILOSEC) 20 MG capsule    PHARMACY:[id number] - , [place] - [address]  **Let patient know to contact pharmacy at the end of the day to make sure medication is ready. **  ** Please notify patient to allow 48-72 hours to process**  **Encourage patient to contact the pharmacy for refills or they can request refills through [facility]**  CLINICAL FILLS OUT ALL BELOW:   LAST REFILL:  QTY:  REFILL DATE:    OTHER COMMENTS:    Okay for refill?  Please advise

## 2020-07-15 ENCOUNTER — Other Ambulatory Visit: Payer: Self-pay | Admitting: Family Medicine

## 2020-07-17 ENCOUNTER — Other Ambulatory Visit: Payer: Self-pay | Admitting: Family Medicine

## 2020-07-18 NOTE — Telephone Encounter (Signed)
Pt requesting Cyclobenzaprine 5 mg tab LOV: 06/30/2020 Future Visits: 08/22/2020 Approve?

## 2020-08-22 ENCOUNTER — Ambulatory Visit: Payer: BC Managed Care – PPO | Admitting: Family Medicine

## 2020-08-22 ENCOUNTER — Other Ambulatory Visit: Payer: Self-pay

## 2020-08-22 ENCOUNTER — Encounter: Payer: Self-pay | Admitting: Family Medicine

## 2020-08-22 VITALS — BP 130/81 | HR 98 | Temp 97.7°F | Ht 61.0 in | Wt 133.6 lb

## 2020-08-22 DIAGNOSIS — E1122 Type 2 diabetes mellitus with diabetic chronic kidney disease: Secondary | ICD-10-CM

## 2020-08-22 DIAGNOSIS — M79641 Pain in right hand: Secondary | ICD-10-CM

## 2020-08-22 DIAGNOSIS — N1832 Chronic kidney disease, stage 3b: Secondary | ICD-10-CM | POA: Diagnosis not present

## 2020-08-22 DIAGNOSIS — M79642 Pain in left hand: Secondary | ICD-10-CM

## 2020-08-22 DIAGNOSIS — E1121 Type 2 diabetes mellitus with diabetic nephropathy: Secondary | ICD-10-CM | POA: Diagnosis not present

## 2020-08-22 DIAGNOSIS — Z794 Long term (current) use of insulin: Secondary | ICD-10-CM

## 2020-08-22 NOTE — Progress Notes (Signed)
Patient: Wanda Medina MRN: [id number] DOB: [birth year] PCP: Orland Mustard, MD     Subjective:  Chief Complaint  Patient presents with  . Diabetes  . Hand Pain    HPI: The patient is a 69 y.o. female who presents today for diabetes. She complains of hand pain, starting 2-3 weeks ago. She says that it has worsened.  Diabetes  Patient is here for follow up of type 2 diabetes. Currently on the following medications tradjenta, glipizide 5mg  BID, novolog 6 units premeal if >100 units and lantus 8 units.Takes medications as prescribed. Last A1C was 7.3. Currently exercising and following diabetic diet. Fasting Sugars range less than 100. If she doesn't eat a bed time snack she will have some low sugars to 50-60. post prandial: 150-200+. She does take her novolog before her lunch.Denies any vision changes, nausea, vomiting, abdominal pain, ulcers/paraesthesia in feet, polyuria, polydipsia or polyphagia. Denies any chest pain, shortness of breath.weight is stable.   -biggest meal is lunch, is taking her novolog before this.  -was walking on her treadmill, but her knees have been hurting so she tries to move every 5 minutes. When she is physically active her sugar is much better.  -sugars reviewed.   Hand pain Bilateral hand pain. Left hand is worse than right hand. She has pain in her bilateral thumb joints. She is ambidextrous. She writes with the right hand, but uses her left hand for everything else (brusing her teeth, sewing, etc with her left) no swelling and no redness. She noticed about one week ago. Does not recall any precipitating factors. Has not taken anything for pain. No radiation. Pain rated as a 5-6/10 if she uses her hand continuously. If she does one thing only pain is 10/10. Pain is sharp in nature. Open doors, jars, twisting make it worse. It doesn't hurt to use her thumbs on her phone. If she puts pressure on the thumbs it hurts. Pain not worse in the AM. She  has a soft stress ball that she squeezes and it helps. Has had numerous trigger finger releases in bilateral hands, but no issues opening or closing thumbs and no popping or catching.   Review of Systems  Constitutional: Negative for appetite change, chills, fatigue and fever.  Respiratory: Negative for shortness of breath and wheezing.   Cardiovascular: Negative for chest pain, palpitations and leg swelling.  Gastrointestinal: Negative for abdominal pain, diarrhea, nausea and vomiting.  Genitourinary: Negative for frequency and urgency.  Musculoskeletal: Positive for arthralgias. Negative for joint swelling.  Skin: Negative for rash.  Neurological: Negative for dizziness and headaches.    Allergies Patient is allergic to ace inhibitors, altace [ramipril], and lisinopril.  Past Medical History Patient  has a past medical history of Allergy, Arthritis, Chronic kidney disease, Diabetes mellitus without complication (HCC), GERD (gastroesophageal reflux disease), Hyperlipemia, and Hypertension.  Surgical History Patient  has a past surgical history that includes Medial partial knee replacement; Colonoscopy; Trigger finger release; and Appendectomy.  Family History Pateint's family history includes Arthritis in her mother; COPD in her father and mother; Heart attack in her brother, father, and mother.  Social History Patient  reports that she has never smoked. She has never used smokeless tobacco. She reports that she does not drink alcohol and does not use drugs.    Objective: Vitals:   08/22/20 0949  BP: 130/81  Pulse: 98  Temp: 97.7 F (36.5 C)  TempSrc: Temporal  SpO2: 100%  Weight: 133 lb 9.6 oz (60.6 kg)  Height: 5\' 1"  (1.549 m)    Body mass index is 25.24 kg/m.  Physical Exam Vitals reviewed.  Constitutional:      Appearance: Normal appearance. She is obese.  HENT:     Head: Normocephalic and atraumatic.  Cardiovascular:     Rate and Rhythm: Normal rate and  regular rhythm.     Heart sounds: Normal heart sounds.  Pulmonary:     Effort: Pulmonary effort is normal.     Breath sounds: Normal breath sounds.  Abdominal:     General: Abdomen is flat. Bowel sounds are normal.     Palpations: Abdomen is soft.  Musculoskeletal:        General: Swelling and tenderness present. No deformity or signs of injury.     Cervical back: Normal range of motion and neck supple.     Comments: Left thumb: TTP over mcp and adductor pollicus. Mild edema. No erythema. Full rom in joint with no popping.  Right thumb: TTP over mcp and adductor pollicus. No edema, erythema. Full rom in tact.   Skin:    General: Skin is warm.     Capillary Refill: Capillary refill takes less than 2 seconds.  Neurological:     General: No focal deficit present.     Mental Status: She is alert and oriented to person, place, and time.  Psychiatric:        Mood and Affect: Mood normal.        Behavior: Behavior normal.        Assessment/plan: 1. Type 2 diabetes mellitus with stage 3b chronic kidney disease, with long-term current use of insulin (HCC) -a1c today, will adjust medication as needed.  -fasting sugars to goal, may need to adjust novolog to get post prandial sugars under better control, but cautious with exercise as they are more to goal when she is active.  -UTD on HM with diabetes -flu shot in October -f/u in 3 months time.  -hypo precautions given.  - Hemoglobin A1c; Future - COMPLETE METABOLIC PANEL WITH GFR; Future  2. Bilateral hand pain Do not think she has trigger finger. ? Tendonitis for OA. Sending for xrays since not in house. Checking RA/ANA. Her daughter has RA. Does not want any injections so will have her start thumb spica splint and voltaren gel qid until I get labs/xray back.  - ANA, IFA Comprehensive Panel; Future - Rheumatoid factor; Future - DG Hand Complete Left; Future - DG Hand Complete Right; Future     This visit occurred during the  SARS-CoV-2 public health emergency.  Safety protocols were in place, including screening questions prior to the visit, additional usage of staff PPE, and extensive cleaning of exam room while observing appropriate contact time as indicated for disinfecting solutions.     Return in about 3 months (around 11/21/2020) for diabetes/htn routine labs. 11/23/2020, MD [facility]   08/22/2020

## 2020-08-22 NOTE — Patient Instructions (Addendum)
I have ordered xrays for you. At this time we do not have xrays in our clinic. You will have to go to our [place] clinic. The address is [address]  xray is located in the basement.  Hours of operation are M-F 8:30am to 5:00pm.  Closed for lunch between 12:30 and 1:00pm.    -lots of labs today. Xrays of hands. Get voltaren gel over the counter for hands. Will use four times a day for pain. Checking for rheumatoid arthritis and a ANA (autoimmune test), but likely regular arthritis. If pain continues we can send to ortho for possible injection.   -could try a thumb spica splint for 1-2 weeks as well if you have a tendonitis of the muscle of the thumb.

## 2020-08-24 ENCOUNTER — Ambulatory Visit (INDEPENDENT_AMBULATORY_CARE_PROVIDER_SITE_OTHER)
Admission: RE | Admit: 2020-08-24 | Discharge: 2020-08-24 | Disposition: A | Discharge status: Home / Self Care | Payer: BC Managed Care – PPO | Source: Ambulatory Visit | Attending: Family Medicine | Admitting: Family Medicine

## 2020-08-24 ENCOUNTER — Other Ambulatory Visit: Payer: Self-pay

## 2020-08-24 DIAGNOSIS — M79641 Pain in right hand: Secondary | ICD-10-CM

## 2020-08-24 DIAGNOSIS — M79642 Pain in left hand: Secondary | ICD-10-CM

## 2020-08-24 LAB — COMPLETE METABOLIC PANEL WITH GFR
AG Ratio: 1.4 (calc) (ref 1.0–2.5)
ALT: 20 U/L (ref 6–29)
AST: 23 U/L (ref 10–35)
Albumin: 4.2 g/dL (ref 3.6–5.1)
Alkaline phosphatase (APISO): 74 U/L (ref 37–153)
BUN/Creatinine Ratio: 16 (calc) (ref 6–22)
BUN: 20 mg/dL (ref 7–25)
CO2: 27 mmol/L (ref 20–32)
Calcium: 9.3 mg/dL (ref 8.6–10.4)
Chloride: 105 mmol/L (ref 98–110)
Creat: 1.28 mg/dL — ABNORMAL HIGH (ref 0.60–0.93)
GFR, Est African American: 49 mL/min/{1.73_m2} — ABNORMAL LOW (ref >=60)
GFR, Est Non African American: 42 mL/min/{1.73_m2} — ABNORMAL LOW (ref >=60)
Globulin: 3.1 g/dL (calc) (ref 1.9–3.7)
Glucose, Bld: 107 mg/dL — ABNORMAL HIGH (ref 65–99)
Potassium: 4.3 mmol/L (ref 3.5–5.3)
Sodium: 140 mmol/L (ref 135–146)
Total Bilirubin: 0.4 mg/dL (ref 0.2–1.2)
Total Protein: 7.3 g/dL (ref 6.1–8.1)

## 2020-08-24 LAB — ANA, IFA COMPREHENSIVE PANEL
Anti Nuclear Antibody (ANA): NEGATIVE
ENA SM Ab Ser-aCnc: <1 AI
SM/RNP: <1 AI
SSA (Ro) (ENA) Antibody, IgG: <1 AI
SSB (La) (ENA) Antibody, IgG: <1 AI
Scleroderma (Scl-70) (ENA) Antibody, IgG: <1 AI
ds DNA Ab: <1 IU/mL

## 2020-08-24 LAB — HEMOGLOBIN A1C
Hgb A1c MFr Bld: 7.5 % of total Hgb — ABNORMAL HIGH (ref <5.7)
Mean Plasma Glucose: 169 (calc)
eAG (mmol/L): 9.3 (calc)

## 2020-08-24 LAB — RHEUMATOID FACTOR: Rheumatoid fact SerPl-aCnc: <14 IU/mL (ref <14)

## 2020-09-09 ENCOUNTER — Other Ambulatory Visit: Payer: Self-pay | Admitting: Family Medicine

## 2020-09-21 ENCOUNTER — Encounter: Payer: Self-pay | Admitting: Family Medicine

## 2020-10-09 ENCOUNTER — Other Ambulatory Visit: Payer: Self-pay | Admitting: Family Medicine

## 2020-10-10 ENCOUNTER — Other Ambulatory Visit: Payer: Self-pay

## 2020-10-10 ENCOUNTER — Ambulatory Visit: Payer: BC Managed Care – PPO | Admitting: Family Medicine

## 2020-10-10 ENCOUNTER — Encounter: Payer: Self-pay | Admitting: Family Medicine

## 2020-10-10 VITALS — BP 144/89 | HR 119 | Temp 98.6°F | Ht 61.0 in | Wt 130.8 lb

## 2020-10-10 DIAGNOSIS — M62838 Other muscle spasm: Secondary | ICD-10-CM

## 2020-10-10 DIAGNOSIS — R600 Localized edema: Secondary | ICD-10-CM

## 2020-10-10 DIAGNOSIS — R000 Tachycardia, unspecified: Secondary | ICD-10-CM

## 2020-10-10 MED ORDER — PREDNISONE 20 MG PO TABS
40.0000 mg | ORAL_TABLET | Freq: Every day | ORAL | 0 refills | Status: DC
Start: 2020-10-10 — End: 2020-11-24

## 2020-10-10 NOTE — Patient Instructions (Signed)
For your swelling.Marland KitchenMarland Kitchen 1) your heart rate is very fast. Seen a lot with covid shots. ekg shows sinus rhythm but it's fast and likely cause of leg swelling. Will check labs, but this tends to resolve with time. Walking actually may help. Elevate legs and then I need to know if any shortness of breath, increased leg swelling or cough.    For your neck -very sore over trapezius area. This is a very large muscle. Tell your son you have no radicular symptoms that warrant a MRI at this time. No weakness no tingling no numbness. If you aren't getting better in the next few days we can xray then see about MRI. I want you to put voltaren gel on your neck up to four times a day. Also you can take the muscle relaxer 3 times a day. I would only take one pill in the AM and at lunch and then continue to 2 at dinner.  Going to put you on a short course of steroids. Hopefully will help spasm/inflammation over this area.  Please keep in touch so we can do more if not getting better.    Talk soon,  Dr. Artis Flock

## 2020-10-10 NOTE — Progress Notes (Signed)
Patient: Wanda Medina MRN: [id number] DOB: [birth year] PCP: Orland Mustard, MD     Subjective:  Chief Complaint  Patient presents with   Neck Pain   Foot Swelling    HPI: The patient is a 69 y.o. female who presents today for neck pain and swollen feet. Neck pain started 2 weeks ago, and noticed feet swelling last Tuesday.   Neck pain Pain started about 1.5 weeks ago. Pain is located on trapezius. She thought she slept wrong, but it has gotten increasingly worse. Pain goes from trapezius into shoulders. Denies any trauma to this area. Pain is continuous every second. She says the pain is so bad she "wants to die." pain is a 10/10. She can't move her neck. No headache associated with this. She has taken shower, but the shower water hurts too. She has tried tylenol and this hasn't touched the pain. She has no arm weakness, tingling or numbness. She does have a flexeril 10mg  that she takes at night. She isn't sure if this helps. Has not taken during the day.   Bilateral lower leg swelling Noticed this last Tuesday. She stopped walking, elevated her feet and drank water. The swelling is much better. She can not recall any precipitating event. She has been walking 4 miles a day. No cough or shortness of breath.   Got covid booster on 10/10.   Review of Systems  Constitutional: Negative for chills, fatigue and fever.  Respiratory: Negative for cough and shortness of breath.   Cardiovascular: Positive for leg swelling. Negative for chest pain and palpitations.  Gastrointestinal: Negative for abdominal pain and blood in stool.  Musculoskeletal: Positive for back pain, neck pain and neck stiffness. Negative for joint swelling and myalgias.  Skin: Negative for rash.  Neurological: Negative for dizziness and light-headedness.    Allergies Patient is allergic to ace inhibitors, altace [ramipril], and lisinopril.  Past Medical History Patient  has a past medical history of  Allergy, Arthritis, Chronic kidney disease, Diabetes mellitus without complication (HCC), GERD (gastroesophageal reflux disease), Hyperlipemia, and Hypertension.  Surgical History Patient  has a past surgical history that includes Medial partial knee replacement; Colonoscopy; Trigger finger release; and Appendectomy.  Family History Pateint's family history includes Arthritis in her mother; COPD in her father and mother; Heart attack in her brother, father, and mother.  Social History Patient  reports that she has never smoked. She has never used smokeless tobacco. She reports that she does not drink alcohol and does not use drugs.    Objective: Vitals:   10/10/20 1332  BP: (!) 144/89  Pulse: (!) 119  Temp: 98.6 F (37 C)  TempSrc: Temporal  SpO2: 99%  Weight: 130 lb 12.8 oz (59.3 kg)  Height: 5\' 1"  (1.549 m)    Body mass index is 24.71 kg/m.  Physical Exam Vitals reviewed.  Constitutional:      General: She is in acute distress.     Appearance: Normal appearance. She is obese.  HENT:     Head: Normocephalic and atraumatic.  Neck:     Vascular: No carotid bruit.     Comments: She has TTP all along her trapezius into SCM bilaterally. ROM is decreased due to pain/stiffness in trapezius.  Cardiovascular:     Rate and Rhythm: Regular rhythm. Tachycardia present.     Heart sounds: Normal heart sounds.  Pulmonary:     Effort: Pulmonary effort is normal.     Breath sounds: Normal breath sounds.  Abdominal:  General: Bowel sounds are normal.     Palpations: Abdomen is soft.  Musculoskeletal:     Cervical back: Tenderness present.     Right lower leg: Edema present.     Left lower leg: Edema present.     Comments: Bilateral lower leg edema. Pitting 2-3+ above ankles.   Lymphadenopathy:     Cervical: No cervical adenopathy.  Neurological:     General: No focal deficit present.     Mental Status: She is alert and oriented to person, place, and time.  Psychiatric:         Mood and Affect: Mood normal.        Behavior: Behavior normal.    Ekg: sinus tachy with rate of 111. No prior ekg to compare this too.     Assessment/plan: 1. Neck muscle spasm Appears to be a spasm of her trapezius. No trauma or red flags on exam. Checking labs. Steroid burst, voltaren, and increase her muscle relaxer to Tid. She is very miserable. If no improvement discussed we can xray, send to PT. No radicular symptoms at this time to warrant a MRI, but we will watch closely. I asked that she check in with me by end of week or sooner if doing no better.  - CBC with Differential/Platelet; Future - CK; Future - TSH; Future - Magnesium; Future - COMPLETE METABOLIC PANEL WITH GFR; Future - COMPLETE METABOLIC PANEL WITH GFR - Magnesium - TSH - CK - CBC with Differential/Platelet  2. Bilateral leg edema See below.  - Brain natriuretic peptide; Future - EKG 12-Lead - Brain natriuretic peptide  3. Tachycardia I do think this is secondary to her booster shot. Seen in quite a few people. Typically resolves. We need to watch this closely as I do wonder if this has contributed to leg edema. Checking labs, tsh, etc. Strict precautions given for ER if any chest pain, shortness of breath, light headedness.  - EKG 12-Lead  This visit occurred during the SARS-CoV-2 public health emergency.  Safety protocols were in place, including screening questions prior to the visit, additional usage of staff PPE, and extensive cleaning of exam room while observing appropriate contact time as indicated for disinfecting solutions.      Return if symptoms worsen or fail to improve.   Orland Mustard, MD [facility]   10/11/2020

## 2020-10-11 LAB — COMPLETE METABOLIC PANEL WITH GFR
AG Ratio: 1.4 (calc) (ref 1.0–2.5)
ALT: 19 U/L (ref 6–29)
AST: 22 U/L (ref 10–35)
Albumin: 4.6 g/dL (ref 3.6–5.1)
Alkaline phosphatase (APISO): 83 U/L (ref 37–153)
BUN/Creatinine Ratio: 15 (calc) (ref 6–22)
BUN: 19 mg/dL (ref 7–25)
CO2: 26 mmol/L (ref 20–32)
Calcium: 9.9 mg/dL (ref 8.6–10.4)
Chloride: 101 mmol/L (ref 98–110)
Creat: 1.31 mg/dL — ABNORMAL HIGH (ref 0.60–0.93)
GFR, Est African American: 48 mL/min/{1.73_m2} — ABNORMAL LOW (ref >=60)
GFR, Est Non African American: 41 mL/min/{1.73_m2} — ABNORMAL LOW (ref >=60)
Globulin: 3.4 g/dL (calc) (ref 1.9–3.7)
Glucose, Bld: 138 mg/dL — ABNORMAL HIGH (ref 65–99)
Potassium: 4.2 mmol/L (ref 3.5–5.3)
Sodium: 138 mmol/L (ref 135–146)
Total Bilirubin: 0.5 mg/dL (ref 0.2–1.2)
Total Protein: 8 g/dL (ref 6.1–8.1)

## 2020-10-11 LAB — CBC WITH DIFFERENTIAL/PLATELET
Absolute Monocytes: 686 cells/uL (ref 200–950)
Basophils Absolute: 49 cells/uL (ref 0–200)
Basophils Relative: 0.5 %
Eosinophils Absolute: 59 cells/uL (ref 15–500)
Eosinophils Relative: 0.6 %
HCT: 39 % (ref 35.0–45.0)
Hemoglobin: 12.7 g/dL (ref 11.7–15.5)
Lymphs Abs: 1921 cells/uL (ref 850–3900)
MCH: 30 pg (ref 27.0–33.0)
MCHC: 32.6 g/dL (ref 32.0–36.0)
MCV: 92 fL (ref 80.0–100.0)
MPV: 10.7 fL (ref 7.5–12.5)
Monocytes Relative: 7 %
Neutro Abs: 7085 cells/uL (ref 1500–7800)
Neutrophils Relative %: 72.3 %
Platelets: 277 10*3/uL (ref 140–400)
RBC: 4.24 10*6/uL (ref 3.80–5.10)
RDW: 12.3 % (ref 11.0–15.0)
Total Lymphocyte: 19.6 %
WBC: 9.8 10*3/uL (ref 3.8–10.8)

## 2020-10-11 LAB — BRAIN NATRIURETIC PEPTIDE: Brain Natriuretic Peptide: 21 pg/mL (ref <100)

## 2020-10-11 LAB — MAGNESIUM: Magnesium: 1.6 mg/dL (ref 1.5–2.5)

## 2020-10-11 LAB — CK: Total CK: 60 U/L (ref 29–143)

## 2020-10-11 LAB — TSH: TSH: 2.48 mIU/L (ref 0.40–4.50)

## 2020-10-18 ENCOUNTER — Other Ambulatory Visit: Payer: Self-pay | Admitting: Family Medicine

## 2020-10-18 ENCOUNTER — Telehealth: Payer: Self-pay

## 2020-10-18 ENCOUNTER — Other Ambulatory Visit: Payer: Self-pay

## 2020-10-18 NOTE — Telephone Encounter (Signed)
Sent to pharmacy 

## 2020-10-18 NOTE — Telephone Encounter (Signed)
MEDICATION: Humalog 100 unit & sucralfate 1 g  [facility] [address]  Comments:   **Let patient know to contact pharmacy at the end of the day to make sure medication is ready. **  ** Please notify patient to allow 48-72 hours to process**  **Encourage patient to contact the pharmacy for refills or they can request refills through [facility]**

## 2020-11-22 ENCOUNTER — Other Ambulatory Visit: Payer: BC Managed Care – PPO

## 2020-11-22 ENCOUNTER — Other Ambulatory Visit: Payer: Self-pay

## 2020-11-24 ENCOUNTER — Ambulatory Visit: Payer: BC Managed Care – PPO | Admitting: Family Medicine

## 2020-11-24 ENCOUNTER — Other Ambulatory Visit: Payer: Self-pay

## 2020-11-24 ENCOUNTER — Encounter: Payer: Self-pay | Admitting: Family Medicine

## 2020-11-24 VITALS — BP 128/76 | HR 93 | Temp 97.7°F | Ht 61.0 in | Wt 133.0 lb

## 2020-11-24 DIAGNOSIS — Z794 Long term (current) use of insulin: Secondary | ICD-10-CM | POA: Diagnosis not present

## 2020-11-24 DIAGNOSIS — R0789 Other chest pain: Secondary | ICD-10-CM | POA: Diagnosis not present

## 2020-11-24 DIAGNOSIS — E1122 Type 2 diabetes mellitus with diabetic chronic kidney disease: Secondary | ICD-10-CM | POA: Diagnosis not present

## 2020-11-24 DIAGNOSIS — I10 Essential (primary) hypertension: Secondary | ICD-10-CM

## 2020-11-24 DIAGNOSIS — N183 Chronic kidney disease, stage 3 unspecified: Secondary | ICD-10-CM | POA: Diagnosis not present

## 2020-11-24 DIAGNOSIS — E782 Mixed hyperlipidemia: Secondary | ICD-10-CM

## 2020-11-24 LAB — POCT GLYCOSYLATED HEMOGLOBIN (HGB A1C): Hemoglobin A1C: 7.2 % — AB (ref 4.0–5.6)

## 2020-11-24 NOTE — Patient Instructions (Addendum)
Diabetic Sliding Scale (I)  IF BLOOD SUGAR IS:   LESS THAN 100 ( NO INSULIN)   100-140 (2 UNITS)   141-180 (4 UNITS)   181-220 (6 UNITS)   221-260 (8 UNITS)   261-300 (10 UNITS)   301-340 (12 UNITS)  Stress test ordered for your heart. They will call you to set this up!  ekg today.   See you back in 3 months time! Merry christmas! Dr. Artis Flock

## 2020-11-24 NOTE — Progress Notes (Signed)
Patient: Wanda Medina MRN: [id number] DOB: [birth year] PCP: Orland Mustard, MD     Subjective:  Chief Complaint  Patient presents with  . Diabetes Mellitus  . Hypertension    HPI: The patient is a 69 y.o. female who presents today for 3 month follow up DM/HTN and labs. I saw her back in November for neck spasm and this has resolved.   Diabetes  Patient is here for follow up of type 2 diabetes. Currently on the following medications tradjenta, glipizide 5mg  BID, novolog 8 units premeal if >100 units and lantus 8 units.Takes medications as prescribed. Last A1C was 7.2. (today) Currently not exercising and following diabetic diet. Fasting Sugars range from60- 130. If she doesn't eat a bed time snack she will have some low sugars to 50-60. post prandial: 71--300.Denies any vision changes, nausea, vomiting, abdominal pain, ulcers/paraesthesia in feet, polyuria, polydipsia or polyphagia. Denies any chest pain, shortness of breath.  -she was having some low fasting sugars so dropped her lantus down to 8 units. All fasting have been to goal with none under 60.  -biggest meal is her lunch and her sugars are quite high after this. I increased her to 8 units of novolog at our last visit. Discussed doing a sliding scale as well.   Hypertension:  Here for follow up of hypertension.  Currently on norvasc 10mg , cozaar 100mg  .  Takes medication as prescribed and denies any side effects. Exercise includes walking. Weight has been stable. Denies any chest pain, headaches, shortness of breath, vision changes, swelling in lower extremities.    Hyperlipidemia Currently on statin. She is fasting today.   She also has questions about CAD. Both of her parents died from heart attacks. Her brother also had a heart attack, but he is okay. She wants to do a stress test. She is not having any chest pain, but has had a skipped beat at times. She was having palpitations and this was due to her  booster shot. She has had chest pain in the past. 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   Review of Systems  Constitutional: Negative for chills, fatigue and fever.  HENT: Negative for dental problem, ear pain, hearing loss and trouble swallowing.   Eyes: Negative for visual disturbance.  Respiratory: Negative for cough, chest tightness and shortness of breath.   Cardiovascular: Negative for chest pain, palpitations and leg swelling.  Gastrointestinal: Negative for abdominal pain, blood in stool, diarrhea and nausea.  Endocrine: Negative for cold intolerance, polydipsia, polyphagia and polyuria.  Genitourinary: Negative for dysuria and hematuria.  Musculoskeletal: Negative for arthralgias.  Skin: Negative for rash.  Neurological: Negative for dizziness, light-headedness and headaches.  Psychiatric/Behavioral: Negative for dysphoric mood and sleep disturbance. The patient is not nervous/anxious.     Allergies Patient is allergic to ace inhibitors, altace [ramipril], and lisinopril.  Past Medical History Patient  has a past medical history of Allergy, Arthritis, Chronic kidney disease, Diabetes mellitus without complication (HCC), GERD (gastroesophageal reflux disease), Hyperlipemia, and Hypertension.  Surgical History Patient  has a past surgical history that includes Medial partial knee replacement; Colonoscopy; Trigger finger release; and Appendectomy.  Family History Pateint's family history includes Arthritis in her mother; COPD in her father and mother; Heart attack in her brother, father, and mother.  Social History Patient  reports that she has never smoked. She has never used smokeless tobacco. She reports that she does  not drink alcohol and does not use drugs.    Objective: Vitals:   11/24/20 0855  BP: 128/76  Pulse: 93  Temp: 97.7 F (36.5 C)  TempSrc: Temporal  SpO2: 100%  Weight: 133 lb (60.3  kg)  Height: 5\' 1"  (1.549 m)    Body mass index is 25.13 kg/m.  Physical Exam Vitals reviewed.  Constitutional:      Appearance: Normal appearance. She is well-developed and well-nourished. She is obese.  HENT:     Head: Normocephalic and atraumatic.     Right Ear: External ear normal.     Left Ear: External ear normal.     Mouth/Throat:     Mouth: Oropharynx is clear and moist.  Eyes:     Extraocular Movements: EOM normal.     Conjunctiva/sclera: Conjunctivae normal.     Pupils: Pupils are equal, round, and reactive to light.  Neck:     Thyroid: No thyromegaly.     Vascular: No carotid bruit.  Cardiovascular:     Rate and Rhythm: Normal rate and regular rhythm.     Pulses: Intact distal pulses.     Heart sounds: Normal heart sounds. No murmur heard.   Pulmonary:     Effort: Pulmonary effort is normal.     Breath sounds: Normal breath sounds.  Abdominal:     General: Bowel sounds are normal. There is no distension.     Palpations: Abdomen is soft.     Tenderness: There is no abdominal tenderness.  Musculoskeletal:     Cervical back: Normal range of motion and neck supple.  Lymphadenopathy:     Cervical: No cervical adenopathy.  Skin:    General: Skin is warm and dry.     Capillary Refill: Capillary refill takes less than 2 seconds.     Findings: No rash.  Neurological:     General: No focal deficit present.     Mental Status: She is alert and oriented to person, place, and time.     Cranial Nerves: No cranial nerve deficit.     Coordination: Coordination normal.     Deep Tendon Reflexes: Reflexes normal.  Psychiatric:        Mood and Affect: Mood and affect and mood normal.        Behavior: Behavior normal.       a1c: 7.2.  Assessment/plan: 1. Type 2 diabetes mellitus with stage 3 chronic kidney disease, with long-term current use of insulin, unspecified whether stage 3a or 3b CKD (HCC) -a1c improved and near to goal.  -sliding scale insulin given as she  has had some very high readings (>300-400).  -continue with bedtime snack. 1 low blood sugar. Hypoglycemic precautions given.  -UTD on her health maintenance.  -eye exam upcoming -if a1c still borderline to goal may discuss doing GLP-1 and doing away with glipizide and tradjenta.  - POCT glycosylated hemoglobin (Hb A1C)  2. Benign essential HTN Blood pressure is to goal. Continue current anti-hypertensive medications. Refills not given and routine lab work will not be done today. I just did labs in November and cbc/cmp to goal.  Recommended routine exercise and healthy diet including DASH diet and mediterranean diet. Encouraged weight loss. F/u in 6 months.    3. Mixed hyperlipidemia  - Lipid panel; Future  4. Atypical chest pain  - EKG 12-Lead - Exercise Tolerance Test; Future    This visit occurred during the SARS-CoV-2 public health emergency.  Safety protocols were in place, including screening questions prior  to the visit, additional usage of staff PPE, and extensive cleaning of exam room while observing appropriate contact time as indicated for disinfecting solutions.     Return in about 3 months (around 02/22/2021) for diabetes .    Orland Mustard, MD [facility]   11/24/2020

## 2020-12-01 ENCOUNTER — Encounter: Payer: Self-pay | Admitting: Family Medicine

## 2020-12-10 ENCOUNTER — Other Ambulatory Visit: Payer: Self-pay | Admitting: Family Medicine

## 2020-12-12 ENCOUNTER — Other Ambulatory Visit (HOSPITAL_COMMUNITY): Payer: BC Managed Care – PPO

## 2020-12-29 ENCOUNTER — Other Ambulatory Visit: Payer: Self-pay | Admitting: Family Medicine

## 2020-12-31 ENCOUNTER — Other Ambulatory Visit: Payer: Self-pay | Admitting: Family Medicine

## 2021-01-02 NOTE — Telephone Encounter (Signed)
Called into pharmacy

## 2021-01-03 ENCOUNTER — Other Ambulatory Visit: Payer: Self-pay | Admitting: Family Medicine

## 2021-01-03 NOTE — Telephone Encounter (Signed)
  LAST APPOINTMENT DATE: 11/24/2020  NEXT APPOINTMENT DATE:@3 /16/2022  MEDICATION:losartan (COZAAR) 100 MG tablet  [id number] [place] - [address]  COMMENTS: Patient is completely out.   Please advise

## 2021-01-13 ENCOUNTER — Other Ambulatory Visit (HOSPITAL_COMMUNITY)
Admission: RE | Admit: 2021-01-13 | Discharge: 2021-01-13 | Disposition: A | Discharge status: Home / Self Care | Payer: BC Managed Care – PPO | Source: Ambulatory Visit | Attending: Family Medicine | Admitting: Family Medicine

## 2021-01-13 DIAGNOSIS — Z20822 Contact with and (suspected) exposure to covid-19: Secondary | ICD-10-CM | POA: Insufficient documentation

## 2021-01-13 DIAGNOSIS — Z01812 Encounter for preprocedural laboratory examination: Secondary | ICD-10-CM | POA: Diagnosis not present

## 2021-01-13 LAB — SARS CORONAVIRUS 2 (TAT 6-24 HRS): SARS Coronavirus 2: NEGATIVE

## 2021-01-13 NOTE — Addendum Note (Signed)
Addended by: Orland Mustard on: 01/13/2021 03:25 PM   Modules accepted: Orders

## 2021-01-17 ENCOUNTER — Other Ambulatory Visit: Payer: Self-pay

## 2021-01-17 ENCOUNTER — Ambulatory Visit (INDEPENDENT_AMBULATORY_CARE_PROVIDER_SITE_OTHER): Payer: BC Managed Care – PPO

## 2021-01-17 DIAGNOSIS — R0789 Other chest pain: Secondary | ICD-10-CM | POA: Diagnosis not present

## 2021-01-18 LAB — EXERCISE TOLERANCE TEST
Estimated workload: 7 METS
Exercise duration (min): 6 min
Exercise duration (sec): 0 s
MPHR: 150 {beats}/min
Peak HR: 136 {beats}/min
Percent HR: 90 %
RPE: 15
Rest HR: 93 {beats}/min

## 2021-02-16 ENCOUNTER — Telehealth: Payer: Self-pay

## 2021-02-16 ENCOUNTER — Other Ambulatory Visit: Payer: Self-pay | Admitting: Family Medicine

## 2021-02-16 DIAGNOSIS — Z794 Long term (current) use of insulin: Secondary | ICD-10-CM

## 2021-02-16 NOTE — Telephone Encounter (Signed)
Pt wants to do labwork before her appt on the 16th, so she can discuss results with Dr. Artis Flock on the 16th. Please advise

## 2021-02-16 NOTE — Telephone Encounter (Signed)
Please let her know that labs are in. Make sure she is fasting for these, checking cholesterol.  Orland Mustard, MD [facility]

## 2021-02-17 NOTE — Telephone Encounter (Signed)
I spoke with the pt to give message below. She voiced understanding, and will call to make a lab appointment.

## 2021-02-20 ENCOUNTER — Other Ambulatory Visit: Payer: Self-pay

## 2021-02-20 ENCOUNTER — Other Ambulatory Visit (INDEPENDENT_AMBULATORY_CARE_PROVIDER_SITE_OTHER): Payer: BC Managed Care – PPO

## 2021-02-20 DIAGNOSIS — E1122 Type 2 diabetes mellitus with diabetic chronic kidney disease: Secondary | ICD-10-CM | POA: Diagnosis not present

## 2021-02-20 DIAGNOSIS — N183 Chronic kidney disease, stage 3 unspecified: Secondary | ICD-10-CM

## 2021-02-20 DIAGNOSIS — Z794 Long term (current) use of insulin: Secondary | ICD-10-CM | POA: Diagnosis not present

## 2021-02-20 LAB — CBC WITH DIFFERENTIAL/PLATELET
Basophils Absolute: 0.1 10*3/uL (ref 0.0–0.1)
Basophils Relative: 1 % (ref 0.0–3.0)
Eosinophils Absolute: 0.2 10*3/uL (ref 0.0–0.7)
Eosinophils Relative: 4.5 % (ref 0.0–5.0)
HCT: 38.1 % (ref 36.0–46.0)
Hemoglobin: 12.7 g/dL (ref 12.0–15.0)
Lymphocytes Relative: 35.6 % (ref 12.0–46.0)
Lymphs Abs: 1.9 10*3/uL (ref 0.7–4.0)
MCHC: 33.4 g/dL (ref 30.0–36.0)
MCV: 90.4 fl (ref 78.0–100.0)
Monocytes Absolute: 0.4 10*3/uL (ref 0.1–1.0)
Monocytes Relative: 7.4 % (ref 3.0–12.0)
Neutro Abs: 2.8 10*3/uL (ref 1.4–7.7)
Neutrophils Relative %: 51.5 % (ref 43.0–77.0)
Platelets: 214 10*3/uL (ref 150.0–400.0)
RBC: 4.22 Mil/uL (ref 3.87–5.11)
RDW: 13.1 % (ref 11.5–15.5)
WBC: 5.4 10*3/uL (ref 4.0–10.5)

## 2021-02-20 LAB — COMPREHENSIVE METABOLIC PANEL WITH GFR
ALT: 19 U/L (ref 0–35)
AST: 24 U/L (ref 0–37)
Albumin: 4 g/dL (ref 3.5–5.2)
Alkaline Phosphatase: 68 U/L (ref 39–117)
BUN: 34 mg/dL — ABNORMAL HIGH (ref 6–23)
CO2: 23 meq/L (ref 19–32)
Calcium: 9.7 mg/dL (ref 8.4–10.5)
Chloride: 106 meq/L (ref 96–112)
Creatinine, Ser: 1.24 mg/dL — ABNORMAL HIGH (ref 0.40–1.20)
GFR: 43.98 mL/min — ABNORMAL LOW
Glucose, Bld: 100 mg/dL — ABNORMAL HIGH (ref 70–99)
Potassium: 3.6 meq/L (ref 3.5–5.1)
Sodium: 138 meq/L (ref 135–145)
Total Bilirubin: 0.4 mg/dL (ref 0.2–1.2)
Total Protein: 7 g/dL (ref 6.0–8.3)

## 2021-02-20 LAB — LIPID PANEL
Cholesterol: 167 mg/dL (ref 0–200)
HDL: 65.5 mg/dL (ref >39.00)
LDL Cholesterol: 76 mg/dL (ref 0–99)
NonHDL: 101.59
Total CHOL/HDL Ratio: 3
Triglycerides: 130 mg/dL (ref 0.0–149.0)
VLDL: 26 mg/dL (ref 0.0–40.0)

## 2021-02-20 LAB — HEMOGLOBIN A1C: Hgb A1c MFr Bld: 7.5 % — ABNORMAL HIGH (ref 4.6–6.5)

## 2021-02-22 ENCOUNTER — Ambulatory Visit: Payer: BC Managed Care – PPO | Admitting: Family Medicine

## 2021-02-22 ENCOUNTER — Encounter: Payer: Self-pay | Admitting: Family Medicine

## 2021-02-22 ENCOUNTER — Other Ambulatory Visit: Payer: Self-pay

## 2021-02-22 VITALS — BP 117/74 | HR 93 | Temp 97.2°F | Ht 61.0 in | Wt 127.2 lb

## 2021-02-22 DIAGNOSIS — E1122 Type 2 diabetes mellitus with diabetic chronic kidney disease: Secondary | ICD-10-CM

## 2021-02-22 DIAGNOSIS — N189 Chronic kidney disease, unspecified: Secondary | ICD-10-CM

## 2021-02-22 MED ORDER — TRULICITY 0.75 MG/0.5ML ~~LOC~~ SOAJ: 0.7500 mg | SUBCUTANEOUS | 0 refills | Status: DC

## 2021-02-22 NOTE — Patient Instructions (Addendum)
-  STOP tradjenta and you have already stopped your lantus. If fasting sugars are consistently less than 70, stop your glipizide.   -starting trulicity at .75mg /week x 4 weeks then will increase you to 1.5mg /week. let me know if any GI Upset that lasts for a few weeks.   -do not go over 600mg /calcium and over the counter D3: 2000IU/day.   -f/u in 3 months for diabetes check/htn with change in medication.   -b12 was >1500 last time we checked this. Would do the about three times a week instead of daily.   -do not go over 50mg  of zinc/day. 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

## 2021-02-22 NOTE — Progress Notes (Signed)
Patient: Wanda Medina MRN: [id number] DOB: [birth year] PCP: Orland Mustard, MD     Subjective:  Chief Complaint  Patient presents with  . Diabetes    HPI: The patient is a 70 y.o. female who presents today for diabetes follow up.    Diabetes Patient is here for follow up of type 2 diabetes. Currently on the following medications tradjenta, glipizide5mg  daily, novolog8units premeal if >100 units. She only needs this once a day at lunch meal. She stopped her lantus in February due to low AM sugars.Takes medications as prescribed. Last A1C was7.5. (today) Currently  exercising and following diabetic diet.FastingSugars range from60-130. If she doesn't eat a bed time snack she will have some low sugars to 50-60. post prandial: 71--300.Denies any vision changes, nausea, vomiting, abdominal pain, ulcers/paraesthesia in feet, polyuria, polydipsia or polyphagia. Denies any chest pain, shortness of breath.she has lost 6-7 pounds. She is walking 5-8+ miles/day and feels really good.  -she has her eye exam on 07/06/21.  -she has covid booster back in October. She did do her flu shot.   Review of Systems  Constitutional: Negative for chills, fatigue and fever.  HENT: Negative for dental problem, ear pain, hearing loss and trouble swallowing.   Eyes: Negative for visual disturbance.  Respiratory: Negative for cough, chest tightness and shortness of breath.   Cardiovascular: Negative for chest pain, palpitations and leg swelling.  Gastrointestinal: Negative for abdominal pain, blood in stool, diarrhea and nausea.  Endocrine: Negative for cold intolerance, polydipsia, polyphagia and polyuria.  Genitourinary: Negative for dysuria and hematuria.  Musculoskeletal: Negative for arthralgias.  Skin: Negative for rash.  Neurological: Negative for dizziness and headaches.  Psychiatric/Behavioral: Negative for dysphoric mood and sleep disturbance. The patient is not  nervous/anxious.     Allergies Patient is allergic to ace inhibitors, altace [ramipril], and lisinopril.  Past Medical History Patient  has a past medical history of Allergy, Arthritis, Chronic kidney disease, Diabetes mellitus without complication (HCC), GERD (gastroesophageal reflux disease), Hyperlipemia, and Hypertension.  Surgical History Patient  has a past surgical history that includes Medial partial knee replacement; Colonoscopy; Trigger finger release; and Appendectomy.  Family History Pateint's family history includes Arthritis in her mother; COPD in her father and mother; Heart attack in her brother, father, and mother.  Social History Patient  reports that she has never smoked. She has never used smokeless tobacco. She reports that she does not drink alcohol and does not use drugs.    Objective: Vitals:   02/22/21 1100  BP: 117/74  Pulse: 93  Temp: (!) 97.2 F (36.2 C)  SpO2: 97%  Weight: 127 lb 3.2 oz (57.7 kg)  Height: 5\' 1"  (1.549 m)    Body mass index is 24.03 kg/m.  Physical Exam Vitals reviewed.  Constitutional:      Appearance: Normal appearance. She is well-developed and normal weight.  HENT:     Head: Normocephalic and atraumatic.     Right Ear: Tympanic membrane, ear canal and external ear normal.     Left Ear: Tympanic membrane, ear canal and external ear normal.  Eyes:     Conjunctiva/sclera: Conjunctivae normal.     Pupils: Pupils are equal, round, and reactive to light.  Neck:     Thyroid: No thyromegaly.     Vascular: No carotid bruit.  Cardiovascular:     Rate and Rhythm: Normal rate and regular rhythm.     Pulses: Normal pulses.     Heart sounds: Normal heart sounds. No murmur heard.  Pulmonary:     Effort: Pulmonary effort is normal.     Breath sounds: Normal breath sounds.  Abdominal:     General: Abdomen is flat. Bowel sounds are normal. There is no distension.     Palpations: Abdomen is soft.     Tenderness: There is no  abdominal tenderness.  Musculoskeletal:     Cervical back: Normal range of motion and neck supple.  Lymphadenopathy:     Cervical: No cervical adenopathy.  Skin:    General: Skin is warm and dry.     Capillary Refill: Capillary refill takes less than 2 seconds.     Findings: No rash.  Neurological:     General: No focal deficit present.     Mental Status: She is alert and oriented to person, place, and time.     Cranial Nerves: No cranial nerve deficit.     Coordination: Coordination normal.     Deep Tendon Reflexes: Reflexes normal.  Psychiatric:        Mood and Affect: Mood normal.        Behavior: Behavior normal.        A1c: 7.5, (2 days ago)   Assessment/plan: 1. Type 2 diabetes mellitus with diabetic chronic kidney disease, unspecified CKD stage, unspecified whether long term insulin use (HCC) -already has stopped lantus back in February and fasting sugars remain to goal.  -stopping tradjenta.  -starting trulicity and may need to back off glipizide (down to only once a day) -she has no hx or family hx of MEN/medullary thyroid cancer. Side effects of medication discussed.  -daytime sugars have been trending downwards. Still needing sliding scale only at lunch, but even these numbers improving. Hopefully we can get her controlled on possibly trulicity alone.  -any issue let me know. Hypoglycemic precautions given.  -f/u in 3 months.    This visit occurred during the SARS-CoV-2 public health emergency.  Safety protocols were in place, including screening questions prior to the visit, additional usage of staff PPE, and extensive cleaning of exam room while observing appropriate contact time as indicated for disinfecting solutions.     Return in about 3 months (around 05/25/2021) for diabetes/htn/ckd .   Orland Mustard, MD [place] [facility]   02/22/2021

## 2021-02-24 ENCOUNTER — Other Ambulatory Visit: Payer: Self-pay | Admitting: Family Medicine

## 2021-02-27 ENCOUNTER — Telehealth: Payer: Self-pay

## 2021-02-27 NOTE — Telephone Encounter (Signed)
I spoke with the pt to make her aware of rx refill. She voiced understanding.

## 2021-02-27 NOTE — Telephone Encounter (Signed)
.  patient states she has been without medication since 3/16 LAST APPOINTMENT DATE: 02/24/2021   NEXT APPOINTMENT DATE:@6 /16/2022  MEDICATION:TRULICITY 0.75 MG/0.5ML SOPN   [id number] - [place], [place] - [address]   CLINICAL FILLS OUT ALL BELOW:   LAST REFILL:  QTY:  REFILL DATE:    OTHER COMMENTS:    Okay for refill?  Please advise

## 2021-03-01 NOTE — Telephone Encounter (Signed)
Patient followed up regarding medications

## 2021-03-01 NOTE — Telephone Encounter (Signed)
[facility] and stated the PA was still in process. Spoke with CMA and she states she will talk to insurance.

## 2021-03-02 NOTE — Telephone Encounter (Signed)
Let her know I can't get PA approved unless she has failed one of the insurances approved drugs. Can she call her insurance and see what GLP-1 diabetic drugs they cover?  Thanks! Dr. Artis Flock

## 2021-03-09 ENCOUNTER — Other Ambulatory Visit: Payer: Self-pay | Admitting: Family Medicine

## 2021-03-21 ENCOUNTER — Telehealth: Payer: Self-pay

## 2021-03-21 NOTE — Telephone Encounter (Signed)
..   LAST APPOINTMENT DATE: 03/09/2021   NEXT APPOINTMENT DATE:@6 /16/2022  MEDICATION:TRULICITY 0.75 MG/0.5ML SOPN    [id number] [place] [place] - [address]  Pt is requesting 3 month supply

## 2021-03-21 NOTE — Telephone Encounter (Signed)
I called pt to make her aware that it is too early for refills. She can pick them up on 4/18. Pt voiced understanding.

## 2021-03-21 NOTE — Telephone Encounter (Signed)
Pt can follow up with pharmacy for refills.

## 2021-03-26 ENCOUNTER — Other Ambulatory Visit: Payer: Self-pay | Admitting: Family Medicine

## 2021-03-31 ENCOUNTER — Other Ambulatory Visit: Payer: Self-pay | Admitting: Family Medicine

## 2021-04-12 ENCOUNTER — Telehealth: Payer: Self-pay

## 2021-04-12 NOTE — Telephone Encounter (Signed)
Pt can have labs drawn the day of visit. Once lab results are reviewed, someone from our office will give her a call.

## 2021-04-12 NOTE — Telephone Encounter (Signed)
Pt scheduled her appt with Dr. Artis Flock on 5/27. Pt asked if Dr. Artis Flock could put lab orders in for her so she could get her labs done before the appt and then go over results on the same day. Please advise.

## 2021-04-30 ENCOUNTER — Encounter: Payer: Self-pay | Admitting: Family Medicine

## 2021-05-05 ENCOUNTER — Other Ambulatory Visit: Payer: Self-pay

## 2021-05-05 ENCOUNTER — Encounter: Payer: Self-pay | Admitting: Family Medicine

## 2021-05-05 ENCOUNTER — Ambulatory Visit: Payer: BC Managed Care – PPO | Admitting: Family Medicine

## 2021-05-05 VITALS — BP 132/70 | HR 88 | Temp 97.4°F | Wt 124.8 lb

## 2021-05-05 DIAGNOSIS — I10 Essential (primary) hypertension: Secondary | ICD-10-CM

## 2021-05-05 DIAGNOSIS — E782 Mixed hyperlipidemia: Secondary | ICD-10-CM

## 2021-05-05 DIAGNOSIS — N183 Chronic kidney disease, stage 3 unspecified: Secondary | ICD-10-CM

## 2021-05-05 DIAGNOSIS — E1122 Type 2 diabetes mellitus with diabetic chronic kidney disease: Secondary | ICD-10-CM

## 2021-05-05 DIAGNOSIS — Z794 Long term (current) use of insulin: Secondary | ICD-10-CM | POA: Diagnosis not present

## 2021-05-05 DIAGNOSIS — B351 Tinea unguium: Secondary | ICD-10-CM

## 2021-05-05 LAB — POCT GLYCOSYLATED HEMOGLOBIN (HGB A1C): Hemoglobin A1C: 6.5 % — AB (ref 4.0–5.6)

## 2021-05-05 MED ORDER — AMLODIPINE BESYLATE 10 MG PO TABS: 1.0000 | ORAL_TABLET | Freq: Every day | ORAL | 3 refills | Status: DC

## 2021-05-05 MED ORDER — LOSARTAN POTASSIUM 100 MG PO TABS: 100.0000 mg | ORAL_TABLET | Freq: Every day | ORAL | 3 refills | Status: DC

## 2021-05-05 MED ORDER — TRULICITY 0.75 MG/0.5ML ~~LOC~~ SOAJ: SUBCUTANEOUS | 3 refills | Status: DC

## 2021-05-05 NOTE — Progress Notes (Signed)
Patient: Wanda Medina MRN: [id number] DOB: 17-Nov-1951 PCP: Orland Mustard, MD     Subjective:  Chief Complaint  Patient presents with  . Diabetes  . Hyperlipidemia  . Hypertension    HPI: The patient is a 70 y.o. female who presents today for diabetes, HTN follow up.    Diabetes Patient is here for follow up of type 2 diabetes. Currently on the following medications trulicity .75mg /weekly, glipizide5mg  daily, novolog2-6units premeal on sliding scale before lunch only. . She stopped her lantus in February due to low AM sugars.Takes medications as prescribed. Last A1C was6.5.(today)Currently exercising and following diabetic diet.FastingSugars range from70-130. One low fasting blood sugar at 52, has had a few lows during day.  post prandial:71--300.Denies any vision changes, nausea, vomiting, abdominal pain, ulcers/paraesthesia in feet, polyuria, polydipsia or polyphagia. Denies any chest pain, shortness of breath.she has lost 6-7 pounds.  -She is walking 10+ miles/day.   -she has her eye exam on 07/06/20.  -she has covid booster back in October. She did do her flu shot.  -on ARB/statin   Hypertension:  Here for follow up of hypertension. Currently on norvasc 10mg , cozaar 100mg . Takes medication as prescribed and denies any side effects. Exercise includes walking. Weight has been decreasing steadily. Denies any chest pain, headaches, shortness of breath, vision changes, swelling in lower extremities. She has lost 60 pounds over the past 10 years.   -she is down 3 pounds.   Hyperlipidemia -last lipid panel checked in 02/2021 -LDL at 76, continue statin therapy: crestor 10mg /day.   Review of Systems  Constitutional: Negative for chills, fatigue and fever.  HENT: Negative for dental problem, ear pain, hearing loss and trouble swallowing.   Eyes: Negative for visual disturbance.  Respiratory: Negative for cough, chest tightness and shortness of  breath.   Cardiovascular: Negative for chest pain, palpitations and leg swelling.  Gastrointestinal: Negative for abdominal pain, blood in stool, diarrhea and nausea.  Endocrine: Negative for cold intolerance, polydipsia, polyphagia and polyuria.  Genitourinary: Negative for dysuria and hematuria.  Musculoskeletal: Negative for arthralgias.  Skin: Negative for rash.  Neurological: Negative for dizziness and headaches.  Psychiatric/Behavioral: Negative for dysphoric mood and sleep disturbance. The patient is not nervous/anxious.     Allergies Patient is allergic to ace inhibitors, altace [ramipril], and lisinopril.  Past Medical History Patient  has a past medical history of Allergy, Arthritis, Chronic kidney disease, Diabetes mellitus without complication (HCC), GERD (gastroesophageal reflux disease), Hyperlipemia, and Hypertension.  Surgical History Patient  has a past surgical history that includes Medial partial knee replacement; Colonoscopy; Trigger finger release; and Appendectomy.  Family History Pateint's family history includes Arthritis in her mother; COPD in her father and mother; Heart attack in her brother, father, and mother.  Social History Patient  reports that she has never smoked. She has never used smokeless tobacco. She reports that she does not drink alcohol and does not use drugs.    Objective: Vitals:   05/05/21 0812 05/05/21 0903  BP: 98/64 132/70  Pulse: 88   Temp: (!) 97.4 F (36.3 C)   SpO2: 100%   Weight: 124 lb 12.8 oz (56.6 kg)     Body mass index is 23.58 kg/m.  Physical Exam Vitals reviewed.  Constitutional:      Appearance: Normal appearance. She is well-developed and normal weight.  HENT:     Head: Normocephalic and atraumatic.     Right Ear: Tympanic membrane, ear canal and external ear normal.     Left Ear:  Tympanic membrane, ear canal and external ear normal.  Eyes:     Conjunctiva/sclera: Conjunctivae normal.     Pupils: Pupils  are equal, round, and reactive to light.  Neck:     Thyroid: No thyromegaly.     Vascular: No carotid bruit.  Cardiovascular:     Rate and Rhythm: Normal rate and regular rhythm.     Pulses: Normal pulses.     Heart sounds: Normal heart sounds. No murmur heard.   Pulmonary:     Effort: Pulmonary effort is normal.     Breath sounds: Normal breath sounds.  Abdominal:     General: Abdomen is flat. Bowel sounds are normal. There is no distension.     Palpations: Abdomen is soft.     Tenderness: There is no abdominal tenderness.  Musculoskeletal:     Cervical back: Normal range of motion and neck supple.  Lymphadenopathy:     Cervical: No cervical adenopathy.  Skin:    General: Skin is warm and dry.     Capillary Refill: Capillary refill takes less than 2 seconds.     Findings: No rash.  Neurological:     General: No focal deficit present.     Mental Status: She is alert and oriented to person, place, and time.     Cranial Nerves: No cranial nerve deficit.     Coordination: Coordination normal.     Deep Tendon Reflexes: Reflexes normal.  Psychiatric:        Mood and Affect: Mood normal.        Behavior: Behavior normal.    Diabetic Foot Exam - Simple   Simple Foot Form Diabetic Foot exam was performed with the following findings: Yes 05/05/2021  8:58 AM  Visual Inspection Sensation Testing Pulse Check Comments Thick, fungus toenails. Otherwise normal exam. Referral to podiatry to see about removal vs. Therapy      A1c: 6.5    Assessment/plan: 1. Type 2 diabetes mellitus with stage 3 chronic kidney disease, with long-term current use of insulin, unspecified whether stage 3a or 3b CKD (HCC) -a1c down to 6.5! So proud of her.  -STOP glipizide due to some hypos. Will continue trulicity and novolog sliding scale. Discussed stopping this completely, but she states she eats some cookies and cakes and needs this occasionally. Would see how she is doing in 3 months and see if can  stop this completely.  -foot exam normal today -on ARB/statin/UTD on vaccines.  -eye exam this summer.   2. Benign essential HTN Blood pressure is to goal. Continue current anti-hypertensive medications-norvasc 10mg /day and losartan 100mg /day. Refills given and routine lab work will NOT be done today. Recommended routine exercise and healthy diet including DASH diet and mediterranean diet. Encouraged weight loss. F/u in 3 months for routine labs.  -advised to keep a home log with readings. I think her first BP was not accurate today and she is asymptomatic. Her BP is usually very well controlled, but she wants to make sure not too low.  -will have her bring her readings to Dr. and can tweak medication if needed with all of her weight loss and healthy lifestyle changes.  -call if BP is <110/70 or if symptomatic, went over in detail.   3. Mixed hyperlipidemia Lipids checked last visit 3 months ago and to goal. Continue crestor.     Return in about 3 months (around 08/05/2021) for with dr. [place] for diabetes/routine lab work .     08/07/2021,  MD [place] [facility]  05/05/2021

## 2021-05-05 NOTE — Patient Instructions (Addendum)
-  STOP glipizide! Continue trulicity only. I do think you'll be able to stop the novolog as well, but we can see how the sugars do off the glipizide. trulicity is doing GREAT!!!  -f/u in 3 months time.   -get a blood pressure and cuff and watch to see how you are running. We may need to titrate you down on your meds with all of your weight loss. Bring your log with you next visit when you see dr. Mardelle Matte!   -podiatry referral done for your toenail, they may take off or do medication.   im so proud of you! You are doing such a wonderful job!    Refills done and you will follow up with dr. Mardelle Matte in 3 months time.   Thanks for letting me take care of you! I will miss you!  Dr. Artis Flock

## 2021-05-06 ENCOUNTER — Other Ambulatory Visit: Payer: Self-pay | Admitting: Family Medicine

## 2021-05-10 ENCOUNTER — Telehealth: Payer: Self-pay

## 2021-05-10 NOTE — Telephone Encounter (Signed)
Pt called asking if Dr. Mardelle Matte thinks pt should get the second COVID booster. Please advise.

## 2021-05-10 NOTE — Telephone Encounter (Signed)
Please advise 

## 2021-05-10 NOTE — Telephone Encounter (Signed)
Yes due to age and chronic medical conditions.

## 2021-05-10 NOTE — Telephone Encounter (Signed)
Spoke with patient, gave a verbal understanding °

## 2021-05-24 ENCOUNTER — Ambulatory Visit (INDEPENDENT_AMBULATORY_CARE_PROVIDER_SITE_OTHER): Payer: BC Managed Care – PPO | Admitting: Podiatry

## 2021-05-24 ENCOUNTER — Other Ambulatory Visit: Payer: Self-pay

## 2021-05-24 DIAGNOSIS — M79674 Pain in right toe(s): Secondary | ICD-10-CM

## 2021-05-24 DIAGNOSIS — B351 Tinea unguium: Secondary | ICD-10-CM | POA: Diagnosis not present

## 2021-05-24 DIAGNOSIS — N183 Chronic kidney disease, stage 3 unspecified: Secondary | ICD-10-CM | POA: Diagnosis not present

## 2021-05-24 DIAGNOSIS — M79675 Pain in left toe(s): Secondary | ICD-10-CM | POA: Diagnosis not present

## 2021-05-24 DIAGNOSIS — Z794 Long term (current) use of insulin: Secondary | ICD-10-CM

## 2021-05-24 DIAGNOSIS — E1122 Type 2 diabetes mellitus with diabetic chronic kidney disease: Secondary | ICD-10-CM

## 2021-05-25 ENCOUNTER — Ambulatory Visit: Payer: BC Managed Care – PPO | Admitting: Family Medicine

## 2021-05-26 ENCOUNTER — Encounter: Payer: Self-pay | Admitting: Podiatry

## 2021-05-26 NOTE — Progress Notes (Signed)
  Subjective:  Patient ID: Wanda Medina, female    DOB: January 17, 1951,  MRN: [id number]  Chief Complaint  Patient presents with   Nail Problem    Nail trim    70 y.o. female returns for the above complaint.  Patient presents with thickened elongated dystrophic toenails x10.  Mild pain on palpation.  She would like for me to debride her nail down.  She denies any other acute complaints.  She is a diabetic with last A1c of 6.5  Objective:  There were no vitals filed for this visit. Podiatric Exam: Vascular: dorsalis pedis and posterior tibial pulses are palpable bilateral. Capillary return is immediate. Temperature gradient is WNL. Skin turgor WNL  Sensorium: Normal Semmes Weinstein monofilament test. Normal tactile sensation bilaterally. Nail Exam: Pt has thick disfigured discolored nails with subungual debris noted bilateral entire nail hallux through fifth toenails.  Pain on palpation to the nails. Ulcer Exam: There is no evidence of ulcer or pre-ulcerative changes or infection. Orthopedic Exam: Muscle tone and strength are WNL. No limitations in general ROM. No crepitus or effusions noted. HAV  B/L.  Hammer toes 2-5  B/L. Skin: No Porokeratosis. No infection or ulcers    Assessment & Plan:   1. Pain due to onychomycosis of toenails of both feet   2. Type 2 diabetes mellitus with stage 3 chronic kidney disease, with long-term current use of insulin, unspecified whether stage 3a or 3b CKD (HCC)     Patient was evaluated and treated and all questions answered.  Onychomycosis with pain  -Nails palliatively debrided as below. -Educated on self-care  Procedure: Nail Debridement Rationale: pain  Type of Debridement: manual, sharp debridement. Instrumentation: Nail nipper, rotary burr. Number of Nails: 10  Procedures and Treatment: Consent by patient was obtained for treatment procedures. The patient understood the discussion of treatment and procedures well. All  questions were answered thoroughly reviewed. Debridement of mycotic and hypertrophic toenails, 1 through 5 bilateral and clearing of subungual debris. No ulceration, no infection noted.  Return Visit-Office Procedure: Patient instructed to return to the office for a follow up visit 3 months for continued evaluation and treatment.  Nicholes Rough, DPM    No follow-ups on file.

## 2021-06-23 ENCOUNTER — Other Ambulatory Visit: Payer: Self-pay | Admitting: Family Medicine

## 2021-07-06 LAB — HM DIABETES EYE EXAM

## 2021-07-16 ENCOUNTER — Encounter: Payer: Self-pay | Admitting: Family Medicine

## 2021-08-01 ENCOUNTER — Ambulatory Visit: Payer: BC Managed Care – PPO | Admitting: Family Medicine

## 2021-08-01 ENCOUNTER — Encounter: Payer: Self-pay | Admitting: Family Medicine

## 2021-08-01 ENCOUNTER — Other Ambulatory Visit: Payer: Self-pay

## 2021-08-01 VITALS — BP 112/70 | HR 80 | Temp 98.0°F | Wt 121.8 lb

## 2021-08-01 DIAGNOSIS — E1122 Type 2 diabetes mellitus with diabetic chronic kidney disease: Secondary | ICD-10-CM | POA: Diagnosis not present

## 2021-08-01 DIAGNOSIS — E1169 Type 2 diabetes mellitus with other specified complication: Secondary | ICD-10-CM | POA: Diagnosis not present

## 2021-08-01 DIAGNOSIS — Z794 Long term (current) use of insulin: Secondary | ICD-10-CM | POA: Diagnosis not present

## 2021-08-01 DIAGNOSIS — E11319 Type 2 diabetes mellitus with unspecified diabetic retinopathy without macular edema: Secondary | ICD-10-CM

## 2021-08-01 DIAGNOSIS — N183 Chronic kidney disease, stage 3 unspecified: Secondary | ICD-10-CM | POA: Diagnosis not present

## 2021-08-01 DIAGNOSIS — E1159 Type 2 diabetes mellitus with other circulatory complications: Secondary | ICD-10-CM

## 2021-08-01 DIAGNOSIS — I152 Hypertension secondary to endocrine disorders: Secondary | ICD-10-CM

## 2021-08-01 DIAGNOSIS — E785 Hyperlipidemia, unspecified: Secondary | ICD-10-CM | POA: Insufficient documentation

## 2021-08-01 DIAGNOSIS — E1140 Type 2 diabetes mellitus with diabetic neuropathy, unspecified: Secondary | ICD-10-CM

## 2021-08-01 LAB — POCT GLYCOSYLATED HEMOGLOBIN (HGB A1C): Hemoglobin A1C: 6.7 % — AB (ref 4.0–5.6)

## 2021-08-01 LAB — COMPREHENSIVE METABOLIC PANEL
ALT: 18 U/L (ref 0–35)
AST: 25 U/L (ref 0–37)
Albumin: 4 g/dL (ref 3.5–5.2)
Alkaline Phosphatase: 67 U/L (ref 39–117)
BUN: 35 mg/dL — ABNORMAL HIGH (ref 6–23)
CO2: 25 mEq/L (ref 19–32)
Calcium: 9.9 mg/dL (ref 8.4–10.5)
Chloride: 103 mEq/L (ref 96–112)
Creatinine, Ser: 1.34 mg/dL — ABNORMAL HIGH (ref 0.40–1.20)
GFR: 39.95 mL/min — ABNORMAL LOW (ref >60.00)
Glucose, Bld: 123 mg/dL — ABNORMAL HIGH (ref 70–99)
Potassium: 4.9 mEq/L (ref 3.5–5.1)
Sodium: 137 mEq/L (ref 135–145)
Total Bilirubin: 0.4 mg/dL (ref 0.2–1.2)
Total Protein: 7.2 g/dL (ref 6.0–8.3)

## 2021-08-01 LAB — LIPID PANEL
Cholesterol: 146 mg/dL (ref 0–200)
HDL: 63 mg/dL (ref >39.00)
LDL Cholesterol: 66 mg/dL (ref 0–99)
NonHDL: 82.9
Total CHOL/HDL Ratio: 2
Triglycerides: 85 mg/dL (ref 0.0–149.0)
VLDL: 17 mg/dL (ref 0.0–40.0)

## 2021-08-01 MED ORDER — DAPAGLIFLOZIN PROPANEDIOL 5 MG PO TABS: 5.0000 mg | ORAL_TABLET | Freq: Every day | ORAL | 11 refills | Status: DC

## 2021-08-01 MED ORDER — AMLODIPINE BESYLATE 10 MG PO TABS: 5.0000 mg | ORAL_TABLET | Freq: Every day | ORAL | 3 refills | Status: DC

## 2021-08-01 NOTE — Patient Instructions (Signed)
Please return in 3 months for diabetes follow up   Please stop the humalog at lunch.   Please start farxiga 5mg  daily before breakfast. This will help control your diabetes and protect your kidneys.  Please cut your amlodipine tabs in half; so take only 5mg  daily. We can order a smaller pill if needed.   If you have any questions or concerns, please don't hesitate to send me a message via MyChart or call the office at [phone]. Thank you for visiting with today! It's our pleasure caring for you.

## 2021-08-01 NOTE — Progress Notes (Signed)
Subjective  CC:  Chief Complaint  Patient presents with   Diabetes    3 month f/u    70 year old female new patient to present today for follow-up of diabetes.  Have reviewed her old records including most recent notes from Dr. Sheppard Penton, most recent lab work and past medical history.  We discussed in detail. HPI: Wanda Medina is a 70 y.o. female who presents to the office today for follow up of diabetes and problems listed above in the chief complaint.  Diabetes follow up: She has been managed on Trulicity 0.75 mg weekly, Humalog sliding scale with meals, and up until 3 months ago was on Glucotrol XL.  Because of hypoglycemia this had been stopped.  She is no longer having low blood sugars.  However she only needs a sliding scale regular insulin approximately once daily.  She tolerates the Trulicity well without nausea or adverse effects.  She has never been on an SGLT2 inhibitor.  She does have chronic kidney disease complication from her diabetes.  She has mild diabetic neuropathy for which she takes gabapentin.  She denies foot sores.  Her eye exam in July showed retinopathy.  Immunizations are up-to-date. Hypertension on amlodipine 10 mg daily and Cozaar 100 mg daily.  She tolerates this well.  Blood pressures run normal to low normal.  No orthostatic symptoms.  No palpitations.  No history of cardiac disease. Chronic hyperlipidemia on a statin which is well-tolerated. She overall feels well.  No new concerns.  We discussed medication management and diabetic control in detail. Wt Readings from Last 3 Encounters:  08/01/21 121 lb 12.8 oz (55.2 kg)  05/05/21 124 lb 12.8 oz (56.6 kg)  02/22/21 127 lb 3.2 oz (57.7 kg)    BP Readings from Last 3 Encounters:  08/01/21 112/70  05/05/21 132/70  02/22/21 117/74    Assessment  1. Type 2 diabetes mellitus with stage 3 chronic kidney disease, with long-term current use of insulin, unspecified whether stage 3a or 3b CKD (HCC)   2.  Hypertension associated with diabetes (HCC)   3. Hyperlipidemia associated with type 2 diabetes mellitus (HCC)   4. Type 2 diabetes mellitus with diabetic neuropathy, with long-term current use of insulin (HCC)   5. 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  However, I would like to stop the sliding scale insulin and start low-dose Farxiga 5 mg daily.  This will be to treat her diabetes and also for renal protection.  Educated on use and expectations of [place].  Continue Trulicity at 0.75mg  weekly.  This can be titrated up if needed.  We will recheck in 3 months. Hypertension: Low normal, since starting Farxiga, will decrease amlodipine to 5 mg daily.  Continue Cozaar 100 mg daily.  Monitor for symptoms of hypotension.  Recheck 3 months. Hyperlipidemia with goal LDL less than 70.  Recheck today on statin. Chronic kidney disease secondary to diabetes: Continue to monitor.  Recheck levels today.  Start Tice.  Follow up: No follow-ups on file.. Orders Placed This Encounter  Procedures   Comprehensive metabolic panel   Lipid panel   POCT HgB A1C   Meds ordered this encounter  Medications   dapagliflozin propanediol (FARXIGA) 5 MG TABS tablet    Sig: Take 1 tablet (5 mg total) by mouth daily before breakfast.    Dispense:  30 tablet  Refill:  11   amLODipine (NORVASC) 10 MG tablet    Sig: Take 0.5 tablets (5 mg total) by mouth daily.    Dispense:  90 tablet    Refill:  3      Immunization History  Administered Date(s) Administered   Influenza, High Dose Seasonal PF 12/18/2018   Influenza-Unspecified 11/20/2019, 09/18/2020   PFIZER(Purple Top)SARS-COV-2 Vaccination 01/23/2020, 02/17/2020, 09/18/2020, 07/16/2021   Pneumococcal Polysaccharide-23 10/27/2018   Zoster Recombinat (Shingrix) 08/24/2019    Diabetes Related Lab  Review: Lab Results  Component Value Date   HGBA1C 6.7 (A) 08/01/2021   HGBA1C 6.5 (A) 05/05/2021   HGBA1C 7.5 (H) 02/20/2021    Lab Results  Component Value Date   MICROALBUR 1.9 11/20/2019   Lab Results  Component Value Date   CREATININE 1.34 (H) 08/01/2021   BUN 35 (H) 08/01/2021   NA 137 08/01/2021   K 4.9 08/01/2021   CL 103 08/01/2021   CO2 25 08/01/2021   Lab Results  Component Value Date   CHOL 146 08/01/2021   CHOL 167 02/20/2021   CHOL 186 11/20/2019   Lab Results  Component Value Date   HDL 63.00 08/01/2021   HDL 65.50 02/20/2021   HDL 58.80 11/20/2019   Lab Results  Component Value Date   LDLCALC 66 08/01/2021   LDLCALC 76 02/20/2021   LDLCALC 104 (H) 11/20/2019   Lab Results  Component Value Date   TRIG 85.0 08/01/2021   TRIG 130.0 02/20/2021   TRIG 117.0 11/20/2019   Lab Results  Component Value Date   CHOLHDL 2 08/01/2021   CHOLHDL 3 02/20/2021   CHOLHDL 3 11/20/2019   No results found for: LDLDIRECT The 10-year ASCVD risk score Denman George DC Jr., et al., 2013) is: 18.2%   Values used to calculate the score:     Age: 58 years     Sex: Female     Is Non-Hispanic African American: No     Diabetic: Yes     Tobacco smoker: No     Systolic Blood Pressure: 112 mmHg     Is BP treated: Yes     HDL Cholesterol: 63 mg/dL     Total Cholesterol: 146 mg/dL I have reviewed the PMH, Fam and Soc history. 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 tscore of -1.3. FRAX: 1.2%/9.3%. repeat 3 years. Continue calcium/vitamin D     Hypertension associated with diabetes (HCC) 10/27/2018   GERD (gastroesophageal reflux disease) 10/27/2018   Arthritis 10/27/2018   Allergic rhinitis 10/27/2018    Social History: Patient  reports that she has never smoked. She has never used smokeless tobacco. She reports that she does not drink alcohol and does not use drugs.  Review of Systems: Ophthalmic: negative for eye pain, loss of vision or double vision Cardiovascular: negative for chest pain Respiratory: negative for SOB or persistent cough Gastrointestinal: negative for abdominal pain Genitourinary: negative for dysuria or gross hematuria MSK: negative for foot lesions Neurologic: negative for  weakness or gait disturbance  Objective  Vitals: BP 112/70   Pulse 80   Temp 98 F (36.7 C) (Temporal)   Wt 121 lb 12.8 oz (55.2 kg)   LMP  (LMP Unknown)   SpO2 98%   BMI 23.01 kg/m  General: well appearing, no acute distress  Psych:  Alert and oriented, normal mood and affect Cardiovascular:  Nl S1 and S2, RRR without murmur, gallop or rub. no edema Foot exam: no erythema, pallor, or cyanosis visible nl proprioception and sensation to monofilament testing bilaterally, +2 distal pulses bilaterally    Diabetic education: ongoing education regarding chronic disease management for diabetes was given today. We continue to reinforce the ABC's of diabetic management: A1c (<7 or 8 dependent upon patient), tight blood pressure control, and cholesterol management with goal LDL < 100 minimally. We discuss diet strategies, exercise recommendations, medication options and possible side effects. At each visit, we review recommended immunizations and preventive care recommendations for diabetics and stress that good diabetic control can prevent other problems. See below for this patient's data.   Commons side effects, risks,  benefits, and alternatives for medications and treatment plan prescribed today were discussed, and the patient expressed understanding of the given instructions. Patient is instructed to call or message via MyChart if he/she has any questions or concerns regarding our treatment plan. No barriers to understanding were identified. We discussed Red Flag symptoms and signs in detail. Patient expressed understanding regarding what to do in case of urgent or emergency type symptoms.  Medication list was reconciled, printed and provided to the patient in AVS. Patient instructions and summary information was reviewed with the patient as documented in the AVS. This note was prepared with assistance of Dragon voice recognition software. Occasional wrong-word or sound-a-like substitutions may have occurred due to the inherent limitations of voice recognition software  This visit occurred during the SARS-CoV-2 public health emergency.  Safety protocols were in place, including screening questions prior to the visit, additional usage of staff PPE, and extensive cleaning of exam room while observing appropriate contact time as indicated for disinfecting solutions.

## 2021-08-08 ENCOUNTER — Ambulatory Visit: Payer: BC Managed Care – PPO | Admitting: Family Medicine

## 2021-08-08 DIAGNOSIS — Z794 Long term (current) use of insulin: Secondary | ICD-10-CM | POA: Insufficient documentation

## 2021-08-08 DIAGNOSIS — E11319 Type 2 diabetes mellitus with unspecified diabetic retinopathy without macular edema: Secondary | ICD-10-CM | POA: Insufficient documentation

## 2021-08-08 DIAGNOSIS — E1140 Type 2 diabetes mellitus with diabetic neuropathy, unspecified: Secondary | ICD-10-CM | POA: Insufficient documentation

## 2021-08-12 ENCOUNTER — Other Ambulatory Visit: Payer: Self-pay | Admitting: Family Medicine

## 2021-08-21 ENCOUNTER — Telehealth: Payer: Self-pay

## 2021-08-21 ENCOUNTER — Other Ambulatory Visit: Payer: Self-pay | Admitting: Family Medicine

## 2021-08-21 NOTE — Telephone Encounter (Signed)
LAST APPOINTMENT DATE:  08/01/21  NEXT APPOINTMENT DATE: 10/31/21  MEDICATION:cyclobenzaprine (FLEXERIL) 5 MG tablet  PHARMACY: [id number] - [place], [place] [address]  Pt stated that pharmacy needs pre authorization.

## 2021-08-21 NOTE — Telephone Encounter (Signed)
Left voicemail for patient to return call to clarify medication she needs. Pharmacy states a PA is not needed for Flexeril, it is needed for [place]

## 2021-08-22 NOTE — Telephone Encounter (Signed)
Spoke with patient regarding medication request, patient needs FARXIGA and insurance requests a PA be done for this medication.

## 2021-08-23 ENCOUNTER — Other Ambulatory Visit: Payer: Self-pay

## 2021-08-23 ENCOUNTER — Telehealth: Payer: Self-pay

## 2021-08-23 MED ORDER — AMLODIPINE BESYLATE 5 MG PO TABS: 5.0000 mg | ORAL_TABLET | Freq: Every day | ORAL | 3 refills | Status: DC

## 2021-08-23 NOTE — Telephone Encounter (Signed)
Unable to reach "Wanda Medina" without an extension, unsure what he was calling about

## 2021-08-23 NOTE — Telephone Encounter (Signed)
Spoke with patient regarding PA, let patient know Wanda Medina does not require a PA according to covermymeds. Patient is going to call insurance to see what is covered

## 2021-08-23 NOTE — Telephone Encounter (Signed)
Pt called back for an update on her prescription.

## 2021-08-23 NOTE — Telephone Encounter (Signed)
Beryle Beams called from [facility] called needing more information and clarification on medication. He would like a call back at [phone] [place]  [phone]

## 2021-08-24 ENCOUNTER — Other Ambulatory Visit: Payer: Self-pay | Admitting: Family Medicine

## 2021-08-24 NOTE — Telephone Encounter (Signed)
Pt called again regarding medication. She stated that she did call insurance yesterday and Marcelline Deist does need prior [place]. Pt wants to know if there is something else that Dr Mardelle Matte can prescribe. She stated that she has already gone 3 weeks without medication. Please Advise.

## 2021-08-24 NOTE — Telephone Encounter (Signed)
Spoke with patient regarding PA, let her know that insurance approved her PA

## 2021-08-25 ENCOUNTER — Other Ambulatory Visit: Payer: Self-pay

## 2021-08-25 ENCOUNTER — Ambulatory Visit: Payer: BC Managed Care – PPO | Admitting: Podiatry

## 2021-08-25 ENCOUNTER — Encounter: Payer: Self-pay | Admitting: Podiatry

## 2021-08-25 DIAGNOSIS — N183 Chronic kidney disease, stage 3 unspecified: Secondary | ICD-10-CM | POA: Diagnosis not present

## 2021-08-25 DIAGNOSIS — M79674 Pain in right toe(s): Secondary | ICD-10-CM

## 2021-08-25 DIAGNOSIS — M79675 Pain in left toe(s): Secondary | ICD-10-CM | POA: Diagnosis not present

## 2021-08-25 DIAGNOSIS — E1122 Type 2 diabetes mellitus with diabetic chronic kidney disease: Secondary | ICD-10-CM | POA: Diagnosis not present

## 2021-08-25 DIAGNOSIS — B351 Tinea unguium: Secondary | ICD-10-CM

## 2021-08-25 DIAGNOSIS — Z794 Long term (current) use of insulin: Secondary | ICD-10-CM

## 2021-08-25 NOTE — Progress Notes (Signed)
  Subjective:  Patient ID: Wanda Medina, female    DOB: 09/20/1951,  MRN: [id number]  Chief Complaint  Patient presents with   Nail Problem    Nail trim    70 y.o. female returns for the above complaint.  Patient presents with thickened elongated dystrophic toenails x10.  Mild pain on palpation.  She would like for me to debride her nail down.  She denies any other acute complaints.  She is a diabetic with last A1c of 6.5  Objective:  There were no vitals filed for this visit. Podiatric Exam: Vascular: dorsalis pedis and posterior tibial pulses are palpable bilateral. Capillary return is immediate. Temperature gradient is WNL. Skin turgor WNL  Sensorium: Normal Semmes Weinstein monofilament test. Normal tactile sensation bilaterally. Nail Exam: Pt has thick disfigured discolored nails with subungual debris noted bilateral entire nail hallux through fifth toenails.  Pain on palpation to the nails. Ulcer Exam: There is no evidence of ulcer or pre-ulcerative changes or infection. Orthopedic Exam: Muscle tone and strength are WNL. No limitations in general ROM. No crepitus or effusions noted. HAV  B/L.  Hammer toes 2-5  B/L. Skin: No Porokeratosis. No infection or ulcers    Assessment & Plan:   1. Pain due to onychomycosis of toenails of both feet   2. Type 2 diabetes mellitus with stage 3 chronic kidney disease, with long-term current use of insulin, unspecified whether stage 3a or 3b CKD (HCC)     Patient was evaluated and treated and all questions answered.  Onychomycosis with pain  -Nails palliatively debrided as below. -Educated on self-care  Procedure: Nail Debridement Rationale: pain  Type of Debridement: manual, sharp debridement. Instrumentation: Nail nipper, rotary burr. Number of Nails: 10  Procedures and Treatment: Consent by patient was obtained for treatment procedures. The patient understood the discussion of treatment and procedures well. All  questions were answered thoroughly reviewed. Debridement of mycotic and hypertrophic toenails, 1 through 5 bilateral and clearing of subungual debris. No ulceration, no infection noted.  Return Visit-Office Procedure: Patient instructed to return to the office for a follow up visit 3 months for continued evaluation and treatment.  Anahit Klumb Sieling, DPM    No follow-ups on file.   

## 2021-10-31 ENCOUNTER — Other Ambulatory Visit: Payer: Self-pay

## 2021-10-31 ENCOUNTER — Ambulatory Visit: Payer: BC Managed Care – PPO | Admitting: Family Medicine

## 2021-10-31 ENCOUNTER — Encounter: Payer: Self-pay | Admitting: Family Medicine

## 2021-10-31 VITALS — BP 142/84 | HR 86 | Temp 97.5°F | Ht 61.0 in | Wt 119.0 lb

## 2021-10-31 DIAGNOSIS — E1159 Type 2 diabetes mellitus with other circulatory complications: Secondary | ICD-10-CM | POA: Diagnosis not present

## 2021-10-31 DIAGNOSIS — E1142 Type 2 diabetes mellitus with diabetic polyneuropathy: Secondary | ICD-10-CM

## 2021-10-31 DIAGNOSIS — E1122 Type 2 diabetes mellitus with diabetic chronic kidney disease: Secondary | ICD-10-CM | POA: Diagnosis not present

## 2021-10-31 DIAGNOSIS — N1832 Chronic kidney disease, stage 3b: Secondary | ICD-10-CM | POA: Diagnosis not present

## 2021-10-31 DIAGNOSIS — K219 Gastro-esophageal reflux disease without esophagitis: Secondary | ICD-10-CM | POA: Diagnosis not present

## 2021-10-31 DIAGNOSIS — I152 Hypertension secondary to endocrine disorders: Secondary | ICD-10-CM

## 2021-10-31 LAB — POCT GLYCOSYLATED HEMOGLOBIN (HGB A1C): Hemoglobin A1C: 7.1 % — AB (ref 4.0–5.6)

## 2021-10-31 MED ORDER — DAPAGLIFLOZIN PROPANEDIOL 10 MG PO TABS: 10.0000 mg | ORAL_TABLET | Freq: Every day | ORAL | 3 refills | Status: DC

## 2021-10-31 MED ORDER — ONETOUCH ULTRA VI STRP: ORAL_STRIP | 3 refills | Status: DC

## 2021-10-31 MED ORDER — BD LANCET ULTRAFINE 30G MISC: 1.0000 "application " | Freq: Two times a day (BID) | 3 refills | Status: AC

## 2021-10-31 NOTE — Patient Instructions (Signed)
Please return in 3 months for diabetes follow up   I have ordered farxiga 10mg  to start daily. This is a larger dose.  No other changes are made today.   Glad you are well!  If you have any questions or concerns, please don't hesitate to send me a message via MyChart or call the office at [phone]. Thank you for visiting with [phone] today! It's our pleasure caring for you.

## 2021-10-31 NOTE — Progress Notes (Signed)
Subjective  CC:  Chief Complaint  Patient presents with   Diabetes    HPI: Wanda Medina is a 70 y.o. female who presents to the office today for follow up of diabetes and problems listed above in the chief complaint.  Diabetes follow up: Her diabetic control is reported as  slightly worse . We stopped insulin and started low dose farxiga. She is tolerating this well. Fastings running < 130. No lows.  She denies exertional CP or SOB or symptomatic hypoglycemia. She denies foot sores or paresthesias. Takes gabapentin for neuropathy. Weight is down a few pounds since starting farxiga. Walks 92miles/day. HTN: home readings are normal: 130s/70s. Had low bp on amlodipine 10 daily so it was cut back to 5; takes losartan 100 daily. No edema. No cp.  HLD is at goal on statin.   Wt Readings from Last 3 Encounters:  10/31/21 119 lb (54 kg)  08/01/21 121 lb 12.8 oz (55.2 kg)  05/05/21 124 lb 12.8 oz (56.6 kg)    BP Readings from Last 3 Encounters:  10/31/21 (!) 142/84  08/01/21 112/70  05/05/21 132/70    Assessment  1. Type 2 diabetes mellitus with stage 3b chronic kidney disease, without long-term current use of insulin (HCC)   2. Diabetic peripheral neuropathy (HCC)   3. Hypertension associated with diabetes (HCC)   4. Gastroesophageal reflux disease, unspecified whether esophagitis present      Plan  Diabetes is currently well controlled. Will increase farxiga to 10 daily along with trulicity. Should get a1c < 7. HTN: good control by home readings. No bp med adjustment today. Farxiga increase will also help.  Monitor weight HLD is at goal.  Imms are current.   Follow up: 3 mo to recheck diabetes and bp. Recheck renal fxn/bmp Orders Placed This Encounter  Procedures   POCT HgB A1C   Meds ordered this encounter  Medications   glucose blood (ONETOUCH ULTRA) test strip    Sig: Test blood sugars bid prn    Dispense:  100 strip    Refill:  3   dapagliflozin  propanediol (FARXIGA) 10 MG TABS tablet    Sig: Take 1 tablet (10 mg total) by mouth daily before breakfast.    Dispense:  90 tablet    Refill:  3   Lancets (BD LANCET ULTRAFINE 30G) MISC    Sig: 1 application by Does not apply route in the morning and at bedtime.    Dispense:  360 each    Refill:  3    Generic lancets are okay       Immunization History  Administered Date(s) Administered   Fluad Quad(high Dose 65+) 10/11/2021   Influenza, High Dose Seasonal PF 12/18/2018   Influenza-Unspecified 11/20/2019, 09/18/2020   PFIZER(Purple Top)SARS-COV-2 Vaccination 01/23/2020, 02/17/2020, 09/18/2020, 07/16/2021   Pneumococcal Conjugate-13 09/09/2016   Pneumococcal Polysaccharide-23 10/27/2018   Zoster Recombinat (Shingrix) 08/24/2019, 11/20/2019    Diabetes Related Lab Review: Lab Results  Component Value Date   HGBA1C 6.7 (A) 08/01/2021   HGBA1C 6.5 (A) 05/05/2021   HGBA1C 7.5 (H) 02/20/2021    Lab Results  Component Value Date   MICROALBUR 1.9 11/20/2019   Lab Results  Component Value Date   CREATININE 1.34 (H) 08/01/2021   BUN 35 (H) 08/01/2021   NA 137 08/01/2021   K 4.9 08/01/2021   CL 103 08/01/2021   CO2 25 08/01/2021   Lab Results  Component Value Date   CHOL 146 08/01/2021   CHOL 167  02/20/2021   CHOL 186 11/20/2019   Lab Results  Component Value Date   HDL 63.00 08/01/2021   HDL 65.50 02/20/2021   HDL 58.80 11/20/2019   Lab Results  Component Value Date   LDLCALC 66 08/01/2021   LDLCALC 76 02/20/2021   LDLCALC 104 (H) 11/20/2019   Lab Results  Component Value Date   TRIG 85.0 08/01/2021   TRIG 130.0 02/20/2021   TRIG 117.0 11/20/2019   Lab Results  Component Value Date   CHOLHDL 2 08/01/2021   CHOLHDL 3 02/20/2021   CHOLHDL 3 11/20/2019   No results found for: LDLDIRECT The 10-year ASCVD risk score (Arnett DK, et al., 2019) is: 27.7%   Values used to calculate the score:     Age: 37 years     Sex: Female     Is Non-Hispanic African  American: No     Diabetic: Yes     Tobacco smoker: No     Systolic Blood Pressure: A999333 mmHg     Is BP treated: Yes     HDL Cholesterol: 63 mg/dL     Total Cholesterol: 146 mg/dL I have reviewed the PMH, Fam and Soc history. 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 tscore of -1.3. FRAX: 1.2%/9.3%. repeat 3 years. Continue calcium/vitamin D     GERD (gastroesophageal reflux disease) 10/27/2018    Priority: Medium    Arthritis 10/27/2018    Priority: Medium    B12 deficiency 11/20/2018    Priority: Low   Allergic rhinitis 10/27/2018    Priority: Low    Social History: Patient  reports that she has never smoked. She has never used smokeless tobacco. She reports that she does not drink alcohol and does not use drugs.  Review of Systems: Ophthalmic: negative for eye pain, loss of vision or double vision Cardiovascular: negative for chest pain Respiratory: negative for SOB or persistent cough Gastrointestinal: negative for abdominal pain Genitourinary: negative for dysuria or gross hematuria MSK: negative for foot lesions Neurologic: negative for weakness or gait disturbance  Objective  Vitals: BP (!) 142/84   Pulse 86   Temp (!) 97.5 F (36.4 C) (Temporal)   Ht 5\' 1"  (1.549 m)   Wt 119 lb (54 kg)   LMP  (LMP Unknown)   SpO2 100%    BMI 22.48 kg/m  General: well appearing, no acute distress  Psych:  Alert and oriented, normal mood and affect HEENT:  Normocephalic, atraumatic, moist mucous membranes, supple neck  Cardiovascular:  Nl S1 and S2, RRR without murmur, gallop or rub. no edema Respiratory:  Good breath sounds bilaterally, CTAB with normal effort, no rales    Diabetic education: ongoing education regarding chronic disease management for diabetes was given today. We continue to reinforce the ABC's of diabetic management: A1c (<7 or  8 dependent upon patient), tight blood pressure control, and cholesterol management with goal LDL < 100 minimally. We discuss diet strategies, exercise recommendations, medication options and possible side effects. At each visit, we review recommended immunizations and preventive care recommendations for diabetics and stress that good diabetic control can prevent other problems. See below for this patient's data.   Commons side effects, risks, benefits, and alternatives for medications and treatment plan prescribed today were discussed, and the patient expressed understanding of the given instructions. Patient is instructed to call or message via MyChart if he/she has any questions or concerns regarding our treatment plan. No barriers to understanding were identified. We discussed Red Flag symptoms and signs in detail. Patient expressed understanding regarding what to do in case of urgent or emergency type symptoms.  Medication list was reconciled, printed and provided to the patient in AVS. Patient instructions and summary information was reviewed with the patient as documented in the AVS. This note was prepared with assistance of Dragon voice recognition software. Occasional wrong-word or sound-a-like substitutions may have occurred due to the inherent limitations of voice recognition software  This visit occurred during the SARS-CoV-2 public health emergency.  Safety protocols were in  place, including screening questions prior to the visit, additional usage of staff PPE, and extensive cleaning of exam room while observing appropriate contact time as indicated for disinfecting solutions.

## 2021-11-08 ENCOUNTER — Other Ambulatory Visit: Payer: Self-pay | Admitting: Family Medicine

## 2021-11-19 ENCOUNTER — Other Ambulatory Visit: Payer: Self-pay | Admitting: Family Medicine

## 2021-11-24 ENCOUNTER — Ambulatory Visit: Payer: BC Managed Care – PPO | Admitting: Podiatry

## 2022-01-19 ENCOUNTER — Other Ambulatory Visit: Payer: Self-pay | Admitting: Family Medicine

## 2022-01-20 ENCOUNTER — Other Ambulatory Visit: Payer: Self-pay | Admitting: Family Medicine

## 2022-01-31 ENCOUNTER — Ambulatory Visit: Payer: BC Managed Care – PPO | Admitting: Family Medicine

## 2022-02-12 ENCOUNTER — Encounter: Payer: Self-pay | Admitting: Family Medicine

## 2022-02-12 ENCOUNTER — Ambulatory Visit (INDEPENDENT_AMBULATORY_CARE_PROVIDER_SITE_OTHER): Payer: BC Managed Care – PPO | Admitting: Family Medicine

## 2022-02-12 VITALS — BP 135/85 | HR 87 | Temp 97.9°F | Ht 61.0 in | Wt 117.6 lb

## 2022-02-12 DIAGNOSIS — I10 Essential (primary) hypertension: Secondary | ICD-10-CM

## 2022-02-12 DIAGNOSIS — N1832 Chronic kidney disease, stage 3b: Secondary | ICD-10-CM | POA: Diagnosis not present

## 2022-02-12 DIAGNOSIS — I152 Hypertension secondary to endocrine disorders: Secondary | ICD-10-CM

## 2022-02-12 DIAGNOSIS — E1142 Type 2 diabetes mellitus with diabetic polyneuropathy: Secondary | ICD-10-CM | POA: Diagnosis not present

## 2022-02-12 DIAGNOSIS — E1159 Type 2 diabetes mellitus with other circulatory complications: Secondary | ICD-10-CM | POA: Diagnosis not present

## 2022-02-12 DIAGNOSIS — E1122 Type 2 diabetes mellitus with diabetic chronic kidney disease: Secondary | ICD-10-CM | POA: Diagnosis not present

## 2022-02-12 LAB — POCT GLYCOSYLATED HEMOGLOBIN (HGB A1C): Hemoglobin A1C: 7.8 % — AB (ref 4.0–5.6)

## 2022-02-12 MED ORDER — TRULICITY 1.5 MG/0.5ML ~~LOC~~ SOAJ: 1.5000 mg | SUBCUTANEOUS | 3 refills | Status: DC

## 2022-02-12 NOTE — Progress Notes (Signed)
? ?Subjective  ?CC:  ?Chief Complaint  ?Patient presents with  ? Diabetes  ?  3 month f/u; No issues with [place].Pt is fasting. No concerns today.  ? Hypertension  ? Gastroesophageal Reflux  ? ? ?HPI: Wanda Medina is a 71 y.o. female who presents to the office today for follow up of diabetes and problems listed above in the chief complaint.  ?Diabetes follow up: Her diabetic control is reported as Worse. Fastings are a little higher in spite of increasing farxiga dose to 10 3 months ago. She is eating more whole wheat grains/breads. Otherwise, diet is unchanged and continues to walk up to 10 miles / day.  She denies exertional CP or SOB or symptomatic hypoglycemia. She denies foot sores; remains on gabapentin for neuropathy.  On trulicity 0.75mg  weekly. No Aes. Lantus got ordered but she never picked it up. Possibly this was a pharmacy request error. She should not be on it.  ?HTN: on amlodpine 5 and losartan 100. Doing well. Feeling well. Taking medications w/o adverse effects. No symptoms of CHF, angina; no palpitations, sob, cp or lower extremity edema. Compliant with meds.  ? ?Wt Readings from Last 3 Encounters:  ?02/12/22 117 lb 9.6 oz (53.3 kg)  ?10/31/21 119 lb (54 kg)  ?08/01/21 121 lb 12.8 oz (55.2 kg)  ? ? ?BP Readings from Last 3 Encounters:  ?02/12/22 135/85  ?10/31/21 (!) 142/84  ?08/01/21 112/70  ? ? ?Assessment  ?1. Type 2 diabetes mellitus with stage 3b chronic kidney disease, without long-term current use of insulin ([place])   ?2. Hypertension associated with diabetes ([place])   ?3. Diabetic peripheral neuropathy ([place])   ? ?  ?Plan  ?Diabetes is currently marginally controlled. Increase trulicity to 1.5 mg weekly and continue farxiga.  ?HTN is controlled.  ?Neuropathy is stable on gabapentin. ? ?Follow up: 3 mo for recheck ?Orders Placed This Encounter  ?Procedures  ? POCT HgB A1C  ? ?Meds ordered this encounter  ?Medications  ? Dulaglutide (TRULICITY) 1.5 0000000 SOPN  ?  Sig: Inject  1.5 mg into the skin once a week.  ?  Dispense:  6 mL  ?  Refill:  3  ?  Increasing dose from 0.75mg  weekly  ? ?  ? ?Immunization History  ?Administered Date(s) Administered  ? Fluad Quad(high Dose 65+) 10/11/2021  ? Influenza, High Dose Seasonal PF 12/18/2018  ? Influenza-Unspecified 11/20/2019, 09/18/2020  ? PFIZER(Purple Top)SARS-COV-2 Vaccination 01/23/2020, 02/17/2020, 09/18/2020, 07/16/2021  ? Pneumococcal Conjugate-13 09/09/2016  ? Pneumococcal Polysaccharide-23 10/27/2018  ? Zoster Recombinat (Shingrix) 08/24/2019, 11/20/2019  ? ? ?Diabetes Related Lab Review: ?Lab Results  ?Component Value Date  ? HGBA1C 7.8 (A) 02/12/2022  ? HGBA1C 7.1 (A) 10/31/2021  ? HGBA1C 6.7 (A) 08/01/2021  ?  ?Lab Results  ?Component Value Date  ? MICROALBUR 1.9 11/20/2019  ? ?Lab Results  ?Component Value Date  ? CREATININE 1.34 (H) 08/01/2021  ? BUN 35 (H) 08/01/2021  ? NA 137 08/01/2021  ? K 4.9 08/01/2021  ? CL 103 08/01/2021  ? CO2 25 08/01/2021  ? ?Lab Results  ?Component Value Date  ? CHOL 146 08/01/2021  ? CHOL 167 02/20/2021  ? CHOL 186 11/20/2019  ? ?Lab Results  ?Component Value Date  ? HDL 63.00 08/01/2021  ? HDL 65.50 02/20/2021  ? HDL 58.80 11/20/2019  ? ?Lab Results  ?Component Value Date  ? [place] 66 08/01/2021  ? [place] 76 02/20/2021  ? LDLCALC 104 (H) 11/20/2019  ? ?Lab Results  ?Component  Value Date  ? TRIG 85.0 08/01/2021  ? TRIG 130.0 02/20/2021  ? TRIG 117.0 11/20/2019  ? ?Lab Results  ?Component Value Date  ? CHOLHDL 2 08/01/2021  ? CHOLHDL 3 02/20/2021  ? CHOLHDL 3 11/20/2019  ? ?No results found for: LDLDIRECT ?The 10-year ASCVD risk score (Arnett DK, et al., 2019) is: 25.4% ?  Values used to calculate the score: ?    Age: 75 years ?    Sex: Female ?    Is Non-Hispanic African American: No ?    Diabetic: Yes ?    Tobacco smoker: No ?    Systolic Blood Pressure: A999333 mmHg ?    Is BP treated: Yes ?    HDL Cholesterol: 63 mg/dL ?    Total Cholesterol: 146 mg/dL ?I have reviewed the [place], Fam and Soc  history. ?Patient Active Problem List  ? Diagnosis Date Noted  ? Diabetic peripheral neuropathy ([place]) 10/31/2021  ?  Priority: High  ? 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  Priority: High  ? Hyperlipidemia associated with type 2 diabetes mellitus ([place]) 08/01/2021  ?  Priority: High  ? Type 2 diabetes mellitus with diabetic chronic kidney disease ([place]) 05/18/2019  ?  Priority: High  ?  Baseline creatinine: 1.3-1.4 ?  ? Hypertension associated with diabetes ([place]) 10/27/2018  ?  Priority: High  ? Osteoarthritis of knee, unspecified 11/12/2018  ?  Priority: Medium   ? Osteopenia 11/12/2018  ?  Priority: Medium   ?  Dexa: 04/2020. tscore of -1.3. FRAX: 1.2%/9.3%. repeat 3 years. Continue calcium/vitamin D  ?  ? GERD (gastroesophageal reflux disease) 10/27/2018  ?  Priority: Medium   ? Arthritis 10/27/2018  ?  Priority: Medium   ? B12 deficiency 11/20/2018  ?  Priority: Low  ? Allergic rhinitis 10/27/2018  ?  Priority: Low  ? ? ?Social History: ?Patient  reports that she has never smoked. She has never used smokeless tobacco. She reports that she does not drink alcohol and does not use drugs. ? ?Review of Systems: ?Ophthalmic: negative for eye pain, loss of vision or double vision ?Cardiovascular: negative for chest pain ?Respiratory: negative for SOB or persistent cough ?Gastrointestinal: negative for abdominal pain ?Genitourinary: negative for dysuria or gross hematuria ?MSK: negative for foot lesions ?Neurologic: negative for weakness or gait disturbance ? ?Objective  ?Vitals: BP 135/85   Pulse 87   Temp 97.9 ?F (36.6 ?C) (Temporal)   Ht 5\' 1"  (1.549 m)   Wt 117 lb 9.6 oz (53.3 kg)   LMP  (LMP Unknown)   SpO2 100%   BMI 22.22 kg/m?  ?General: well appearing, no acute distress  ?Psych:  Alert and oriented, normal mood and affect ?HEENT:  Normocephalic, atraumatic, moist mucous membranes, supple neck  ?Cardiovascular:  Nl S1 and S2, RRR  without murmur, gallop or rub. no edema ?Respiratory:  Good breath sounds bilaterally, CTAB with normal effort, no rales ?Gastrointestinal: normal BS, soft, nontender ?No edema ? ? ? ?Diabetic education: ongoing education regarding chronic disease management for diabetes was given today. We continue to reinforce the ABC's of diabetic management: A1c (<7 or 8 dependent upon patient), tight blood pressure control, and cholesterol management with goal LDL < 100 minimally. We discuss diet strategies, exercise recommendations, medication options and possible side effects. At each visit, we review recommended immunizations and preventive care recommendations for diabetics and stress that good diabetic control can prevent other problems.  See below for this patient's data. ? ? ?Commons side effects, risks, benefits, and alternatives for medications and treatment plan prescribed today were discussed, and the patient expressed understanding of the given instructions. Patient is instructed to call or message via MyChart if he/she has any questions or concerns regarding our treatment plan. No barriers to understanding were identified. We discussed Red Flag symptoms and signs in detail. Patient expressed understanding regarding what to do in case of urgent or emergency type symptoms.  ?Medication list was reconciled, printed and provided to the patient in AVS. Patient instructions and summary information was reviewed with the patient as documented in the AVS. ?This note was prepared with assistance of Systems analyst. Occasional wrong-word or sound-a-like substitutions may have occurred due to the inherent limitations of voice recognition software ? ?This visit occurred during the SARS-CoV-2 public health emergency.  Safety protocols were in place, including screening questions prior to the visit, additional usage of staff PPE, and extensive cleaning of exam room while observing appropriate contact time as  indicated for disinfecting solutions.  ? ?

## 2022-02-12 NOTE — Patient Instructions (Signed)
Please return in 3 months for diabetes follow up  ? ?Your diabetic control is slightly worse. Please increase the trulicity to 1.5mg  weekly; I have ordered the larger dose.  ?Continue farxiga 10mg  daily.  ? ?We will recheck in 3 months.  ? ?If you have any questions or concerns, please don't hesitate to send me a message via MyChart or call the office at [phone]. Thank you for visiting with [phone] today! It's our pleasure caring for you.  ?

## 2022-02-20 ENCOUNTER — Other Ambulatory Visit: Payer: Self-pay | Admitting: Family Medicine

## 2022-04-04 LAB — HM DIABETES EYE EXAM

## 2022-04-14 ENCOUNTER — Other Ambulatory Visit: Payer: Self-pay | Admitting: Family Medicine

## 2022-05-04 IMAGING — DX DG KNEE COMPLETE 4+V*L*
4 series · 4 of 4 positions shown · non-contrast
Comparison: 10/02/2014

CLINICAL DATA: Fall, pain

EXAM:
LEFT KNEE - COMPLETE 4+ VIEW

[knee ap]
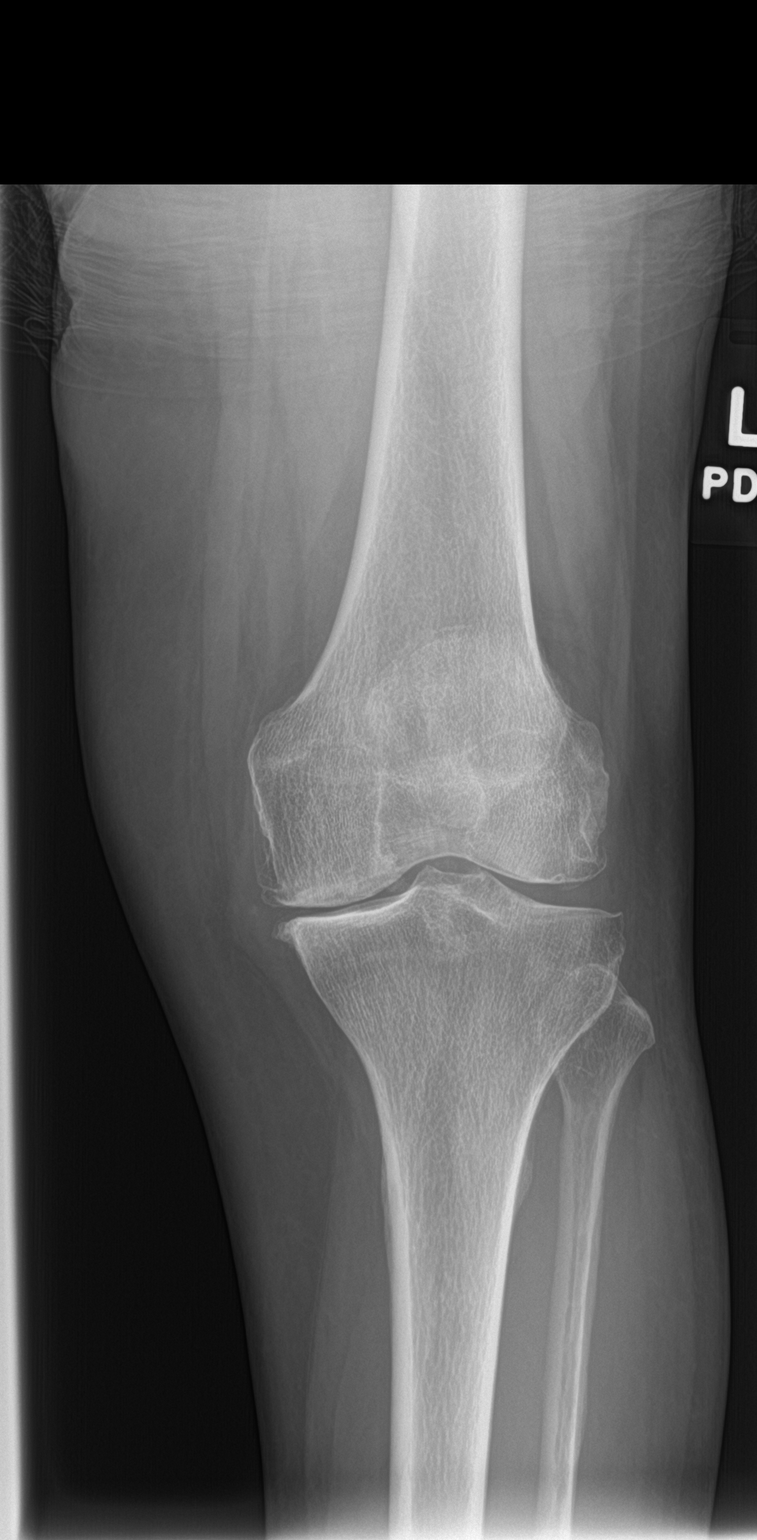

[knee tunnel]
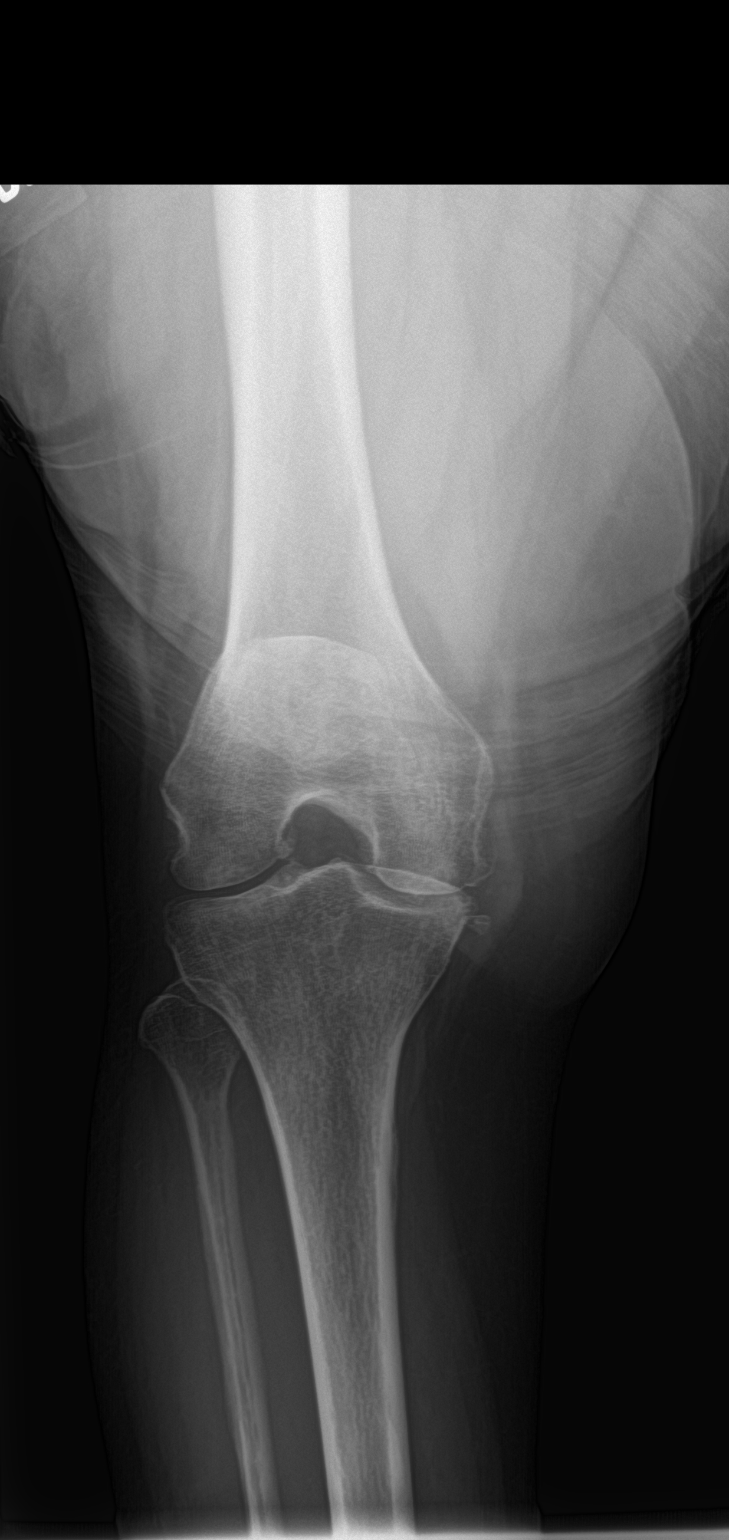

[knee lat]
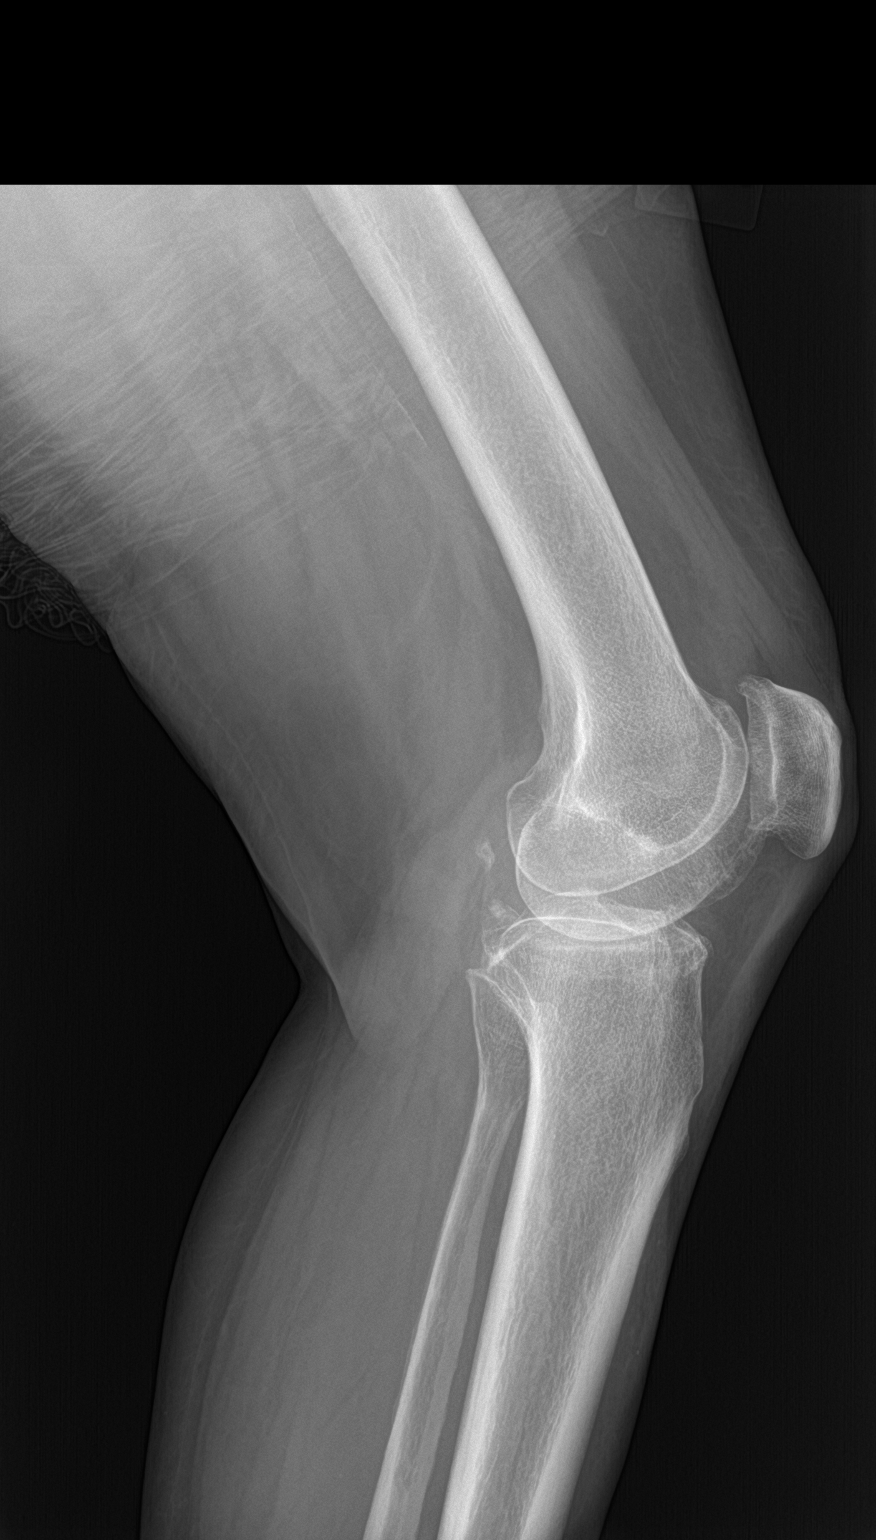

[sunrise]
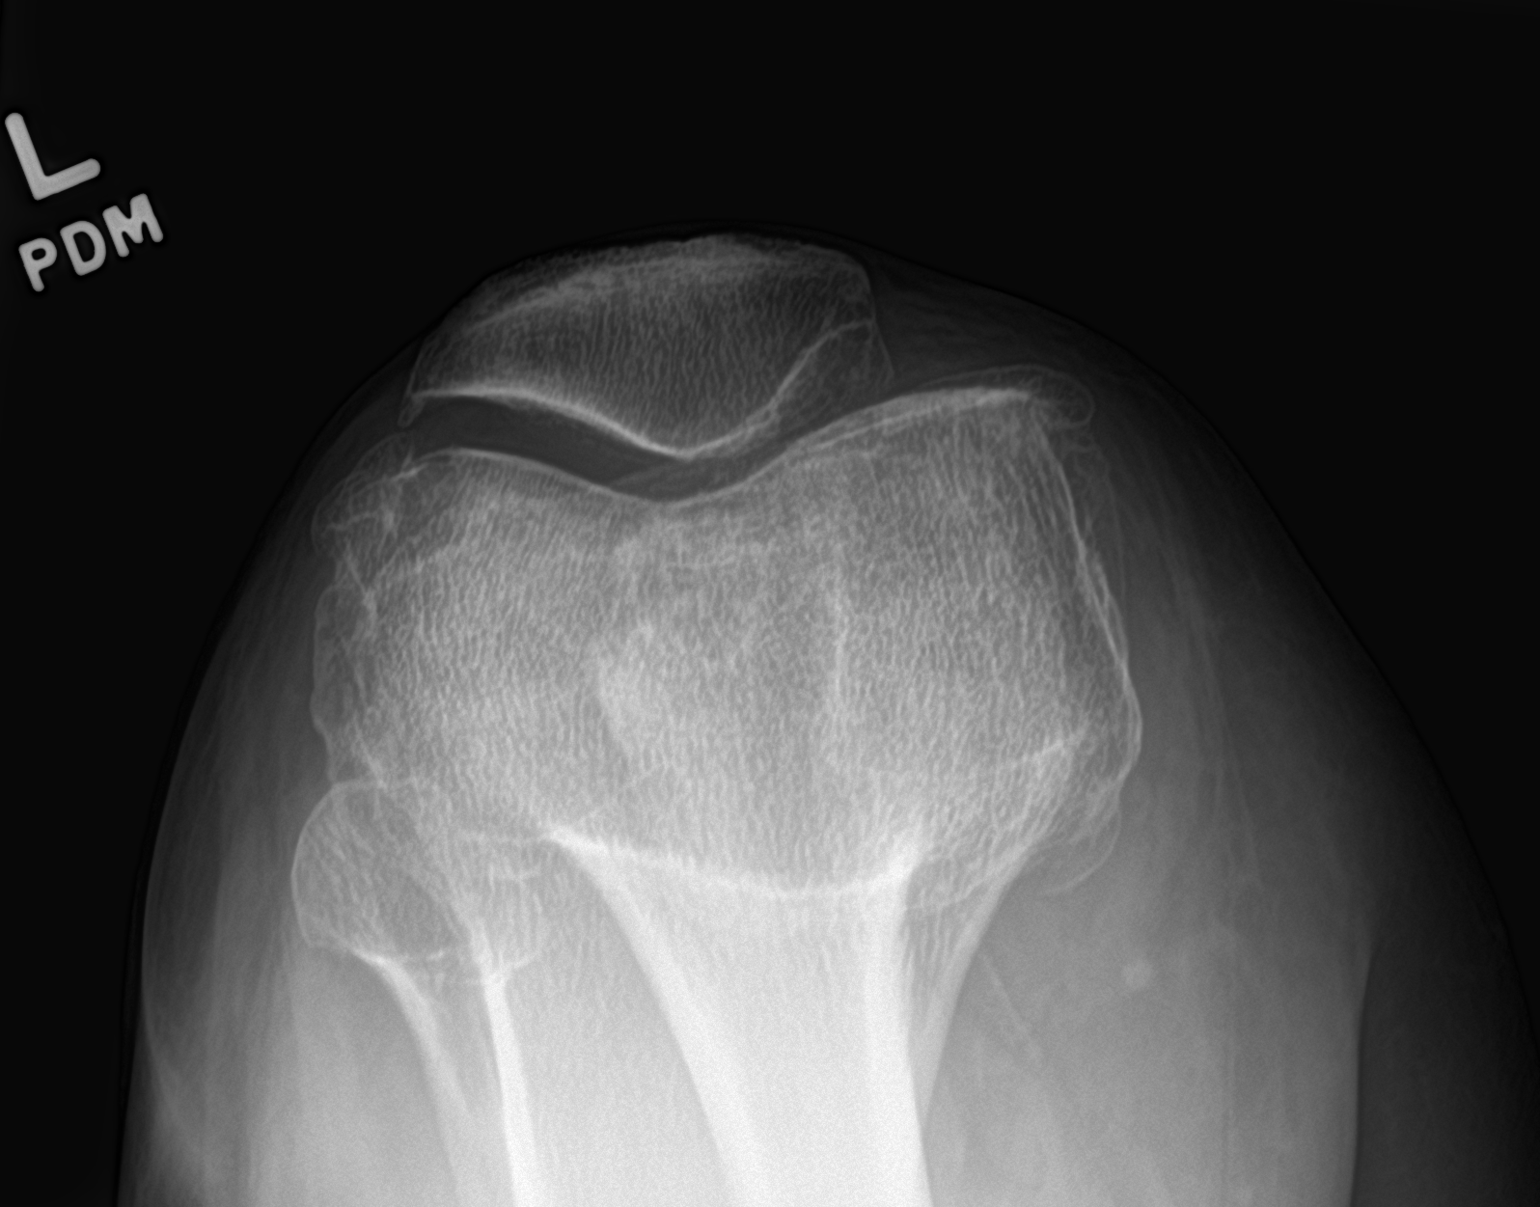

[4 of 4 positions shown; findings below may reference images not displayed]

FINDINGS: Tricompartment degenerative changes with joint space narrowing and
spurring. Small joint effusion. No acute bony abnormality.
Specifically, no fracture, subluxation, or dislocation.
IMPRESSION: Moderate tricompartment degenerative changes with small joint
effusion. No acute bony abnormality.

## 2022-05-14 ENCOUNTER — Encounter: Payer: Self-pay | Admitting: Family Medicine

## 2022-05-14 ENCOUNTER — Ambulatory Visit (INDEPENDENT_AMBULATORY_CARE_PROVIDER_SITE_OTHER): Payer: BC Managed Care – PPO | Admitting: Family Medicine

## 2022-05-14 VITALS — BP 138/76 | HR 92 | Temp 98.0°F | Ht 61.0 in | Wt 117.0 lb

## 2022-05-14 DIAGNOSIS — E11319 Type 2 diabetes mellitus with unspecified diabetic retinopathy without macular edema: Secondary | ICD-10-CM | POA: Diagnosis not present

## 2022-05-14 DIAGNOSIS — E1122 Type 2 diabetes mellitus with diabetic chronic kidney disease: Secondary | ICD-10-CM | POA: Diagnosis not present

## 2022-05-14 DIAGNOSIS — N1832 Chronic kidney disease, stage 3b: Secondary | ICD-10-CM

## 2022-05-14 DIAGNOSIS — E1159 Type 2 diabetes mellitus with other circulatory complications: Secondary | ICD-10-CM | POA: Diagnosis not present

## 2022-05-14 DIAGNOSIS — I10 Essential (primary) hypertension: Secondary | ICD-10-CM

## 2022-05-14 DIAGNOSIS — E1142 Type 2 diabetes mellitus with diabetic polyneuropathy: Secondary | ICD-10-CM | POA: Diagnosis not present

## 2022-05-14 LAB — MICROALBUMIN / CREATININE URINE RATIO
Creatinine,U: 17.8 mg/dL
Microalb Creat Ratio: 41.5 mg/g — ABNORMAL HIGH (ref 0.0–30.0)
Microalb, Ur: 7.4 mg/dL — ABNORMAL HIGH (ref 0.0–1.9)

## 2022-05-14 LAB — POCT GLYCOSYLATED HEMOGLOBIN (HGB A1C): Hemoglobin A1C: 6.9 % — AB (ref 4.0–5.6)

## 2022-05-14 NOTE — Patient Instructions (Signed)
Please return in 3 months for your annual complete physical; please come fasting.   If you have any questions or concerns, please don't hesitate to send me a message via MyChart or call the office at [phone]. Thank you for visiting with us today! It's our pleasure caring for you.  

## 2022-05-14 NOTE — Progress Notes (Signed)
Subjective  CC:  Chief Complaint  Patient presents with   Diabetes    Pt here to F/U with DM    HPI: Wanda Medina is a 71 y.o. female who presents to the office today for follow up of diabetes and problems listed above in the chief complaint.  Diabetes follow up: Her diabetic control is reported as Improved. We increased trulicity dose from 0.75mg  to 1.5 mg weekly; she is tolerating this change well. Weight is stable. Still walking 10 miles per day on treadmill. Daughter is present and is concerned about her activity level. Pt has lost about 25 pounds in last year with this walking program. She does it to keep her diabetes controlled. Denies pain and enjoys it. Also is a stress reliever. She denies exertional CP or SOB or symptomatic hypoglycemia. She denies foot sores or paresthesias.  HTN: Feeling well. Taking medications w/o adverse effects. No symptoms of CHF, angina; no palpitations, sob, cp or lower extremity edema. Compliant with meds. On amlodipine and losartan.  Peripheral neuropathy is controlled on bid gabapentin.   Wt Readings from Last 3 Encounters:  05/14/22 117 lb (53.1 kg)  02/12/22 117 lb 9.6 oz (53.3 kg)  10/31/21 119 lb (54 kg)    BP Readings from Last 3 Encounters:  05/14/22 138/76  02/12/22 135/85  10/31/21 (!) 142/84    Assessment  1. Type 2 diabetes mellitus with stage 3b chronic kidney disease, without long-term current use of insulin ([place])   2. Diabetic peripheral neuropathy ([place])   3. Diabetic retinopathy of both eyes without macular edema associated with type 2 diabetes mellitus, unspecified retinopathy severity ([place])   4. Hypertension associated with diabetes ([place])      Plan  Diabetes is currently very well controlled. On trulicity and farxiga. Will be due for labs next visit. Check nephropathy screen. Continue gabapentin for neuropathy. Controlled.  Discussed excessive exercise. With weight loss, likely she could walk less and sugars  could remain controlled. However, there is no clear contraindication to her walking.  Htn is controlled.   Follow up: 3 mo for cpe and dm recheck. Orders Placed This Encounter  Procedures   Microalbumin / creatinine urine ratio   POCT HgB A1C   No orders of the defined types were placed in this encounter.     Immunization History  Administered Date(s) Administered   Fluad Quad(high Dose 65+) 10/11/2021   Influenza, High Dose Seasonal PF 12/18/2018   Influenza-Unspecified 11/20/2019, 09/18/2020   PFIZER(Purple Top)SARS-COV-2 Vaccination 01/23/2020, 02/17/2020, 09/18/2020, 07/16/2021   Pneumococcal Conjugate-13 09/09/2016   Pneumococcal Polysaccharide-23 10/27/2018   Zoster Recombinat (Shingrix) 08/24/2019, 11/20/2019    Diabetes Related Lab Review: Lab Results  Component Value Date   HGBA1C 6.9 (A) 05/14/2022   HGBA1C 7.8 (A) 02/12/2022   HGBA1C 7.1 (A) 10/31/2021    Lab Results  Component Value Date   MICROALBUR 1.9 11/20/2019   Lab Results  Component Value Date   CREATININE 1.34 (H) 08/01/2021   BUN 35 (H) 08/01/2021   NA 137 08/01/2021   K 4.9 08/01/2021   CL 103 08/01/2021   CO2 25 08/01/2021   Lab Results  Component Value Date   CHOL 146 08/01/2021   CHOL 167 02/20/2021   CHOL 186 11/20/2019   Lab Results  Component Value Date   HDL 63.00 08/01/2021   HDL 65.50 02/20/2021   HDL 58.80 11/20/2019   Lab Results  Component Value Date   LDLCALC 66 08/01/2021   LDLCALC 76  02/20/2021   LDLCALC 104 (H) 11/20/2019   Lab Results  Component Value Date   TRIG 85.0 08/01/2021   TRIG 130.0 02/20/2021   TRIG 117.0 11/20/2019   Lab Results  Component Value Date   CHOLHDL 2 08/01/2021   CHOLHDL 3 02/20/2021   CHOLHDL 3 11/20/2019   No results found for: LDLDIRECT The 10-year ASCVD risk score (Arnett DK, et al., 2019) is: 29.5%   Values used to calculate the score:     Age: 37 years     Sex: Female     Is Non-Hispanic African American: No      Diabetic: Yes     Tobacco smoker: No     Systolic Blood Pressure: 0000000 mmHg     Is BP treated: Yes     HDL Cholesterol: 63 mg/dL     Total Cholesterol: 146 mg/dL I have reviewed the PMH, Fam and Soc history. Patient Active Problem List   Diagnosis Date Noted   Diabetic peripheral neuropathy ([place]) 10/31/2021    Priority: High   Diabetic retinopathy of both eyes without macular edema associated with type 2 diabetes mellitus ([place]) 08/08/2021    Priority: High   Hyperlipidemia associated with type 2 diabetes mellitus ([place]) 08/01/2021    Priority: High   Type 2 diabetes mellitus with diabetic chronic kidney disease ([place]) 05/18/2019    Priority: High    Baseline creatinine: 1.3-1.4     Hypertension associated with diabetes ([place]) 10/27/2018    Priority: High   Osteoarthritis of knee, unspecified 11/12/2018    Priority: Medium    Osteopenia 11/12/2018    Priority: Medium     Dexa: 04/2020. tscore of -1.3. FRAX: 1.2%/9.3%. repeat 3 years. Continue calcium/vitamin D      GERD (gastroesophageal reflux disease) 10/27/2018    Priority: Medium    Arthritis 10/27/2018    Priority: Medium    B12 deficiency 11/20/2018    Priority: Low   Allergic rhinitis 10/27/2018    Priority: Low    Social History: Patient  reports that she has never smoked. She has never used smokeless tobacco. She reports that she does not drink alcohol and does not use drugs.  Review of Systems: Ophthalmic: negative for eye pain, loss of vision or double vision Cardiovascular: negative for chest pain Respiratory: negative for SOB or persistent cough Gastrointestinal: negative for abdominal pain Genitourinary: negative for dysuria or gross hematuria MSK: negative for foot lesions Neurologic: negative for weakness or gait disturbance  Objective  Vitals: BP 138/76   Pulse 92   Temp 98 F (36.7 C)   Ht 5\' 1"  (1.549 m)   Wt 117 lb (53.1 kg)   LMP  (LMP Unknown)   SpO2 (!) 88%   BMI 22.11 kg/m  General: well  appearing, no acute distress  Psych:  Alert and oriented, normal mood and affect HEENT:  Normocephalic, atraumatic, moist mucous membranes, supple neck  Cardiovascular:  Nl S1 and S2, RRR without murmur, gallop or rub. no edema Respiratory:  Good breath sounds bilaterally, CTAB with normal effort, no rales Foot exam: no erythema, pallor, or cyanosis visible nl proprioception and sensation to monofilament testing bilaterally, +2 distal pulses bilaterally    Diabetic education: ongoing education regarding chronic disease management for diabetes was given today. We continue to reinforce the ABC's of diabetic management: A1c (<7 or 8 dependent upon patient), tight blood pressure control, and cholesterol management with goal LDL < 100 minimally. We discuss diet strategies, exercise recommendations, medication options and  possible side effects. At each visit, we review recommended immunizations and preventive care recommendations for diabetics and stress that good diabetic control can prevent other problems. See below for this patient's data.   Commons side effects, risks, benefits, and alternatives for medications and treatment plan prescribed today were discussed, and the patient expressed understanding of the given instructions. Patient is instructed to call or message via MyChart if he/she has any questions or concerns regarding our treatment plan. No barriers to understanding were identified. We discussed Red Flag symptoms and signs in detail. Patient expressed understanding regarding what to do in case of urgent or emergency type symptoms.  Medication list was reconciled, printed and provided to the patient in AVS. Patient instructions and summary information was reviewed with the patient as documented in the AVS. This note was prepared with assistance of Dragon voice recognition software. Occasional wrong-word or sound-a-like substitutions may have occurred due to the inherent limitations of voice  recognition software  This visit occurred during the SARS-CoV-2 public health emergency.  Safety protocols were in place, including screening questions prior to the visit, additional usage of staff PPE, and extensive cleaning of exam room while observing appropriate contact time as indicated for disinfecting solutions.

## 2022-05-15 ENCOUNTER — Ambulatory Visit: Payer: BC Managed Care – PPO | Admitting: Family Medicine

## 2022-06-25 ENCOUNTER — Other Ambulatory Visit: Payer: Self-pay | Admitting: Family Medicine

## 2022-06-29 ENCOUNTER — Other Ambulatory Visit: Payer: Self-pay | Admitting: Family Medicine

## 2022-07-10 LAB — HM MAMMOGRAPHY

## 2022-08-04 ENCOUNTER — Other Ambulatory Visit: Payer: Self-pay | Admitting: Family Medicine

## 2022-08-15 ENCOUNTER — Encounter: Payer: Self-pay | Admitting: Family Medicine

## 2022-08-15 ENCOUNTER — Ambulatory Visit (INDEPENDENT_AMBULATORY_CARE_PROVIDER_SITE_OTHER): Payer: BC Managed Care – PPO | Admitting: Family Medicine

## 2022-08-15 VITALS — BP 126/80 | HR 68 | Temp 97.7°F | Ht 61.0 in | Wt 117.2 lb

## 2022-08-15 DIAGNOSIS — E538 Deficiency of other specified B group vitamins: Secondary | ICD-10-CM

## 2022-08-15 DIAGNOSIS — E1159 Type 2 diabetes mellitus with other circulatory complications: Secondary | ICD-10-CM

## 2022-08-15 DIAGNOSIS — K219 Gastro-esophageal reflux disease without esophagitis: Secondary | ICD-10-CM

## 2022-08-15 DIAGNOSIS — E1169 Type 2 diabetes mellitus with other specified complication: Secondary | ICD-10-CM

## 2022-08-15 DIAGNOSIS — E1122 Type 2 diabetes mellitus with diabetic chronic kidney disease: Secondary | ICD-10-CM | POA: Diagnosis not present

## 2022-08-15 DIAGNOSIS — E785 Hyperlipidemia, unspecified: Secondary | ICD-10-CM

## 2022-08-15 DIAGNOSIS — E11319 Type 2 diabetes mellitus with unspecified diabetic retinopathy without macular edema: Secondary | ICD-10-CM | POA: Diagnosis not present

## 2022-08-15 DIAGNOSIS — N1832 Chronic kidney disease, stage 3b: Secondary | ICD-10-CM | POA: Diagnosis not present

## 2022-08-15 DIAGNOSIS — M8580 Other specified disorders of bone density and structure, unspecified site: Secondary | ICD-10-CM

## 2022-08-15 DIAGNOSIS — I152 Hypertension secondary to endocrine disorders: Secondary | ICD-10-CM

## 2022-08-15 DIAGNOSIS — Z0000 Encounter for general adult medical examination without abnormal findings: Secondary | ICD-10-CM

## 2022-08-15 DIAGNOSIS — I10 Essential (primary) hypertension: Secondary | ICD-10-CM

## 2022-08-15 DIAGNOSIS — E1142 Type 2 diabetes mellitus with diabetic polyneuropathy: Secondary | ICD-10-CM

## 2022-08-15 LAB — POCT GLYCOSYLATED HEMOGLOBIN (HGB A1C): Hemoglobin A1C: 7.9 % — AB (ref 4.0–5.6)

## 2022-08-15 MED ORDER — OMEPRAZOLE 20 MG PO CPDR: 20.0000 mg | DELAYED_RELEASE_CAPSULE | Freq: Every day | ORAL | 3 refills | Status: DC

## 2022-08-15 MED ORDER — TRULICITY 1.5 MG/0.5ML ~~LOC~~ SOAJ: 1.5000 mg | SUBCUTANEOUS | 3 refills | Status: DC

## 2022-08-15 MED ORDER — DAPAGLIFLOZIN PROPANEDIOL 10 MG PO TABS
10.0000 mg | ORAL_TABLET | Freq: Every day | ORAL | 3 refills | Status: DC
Start: 2022-08-15 — End: 2023-02-15

## 2022-08-15 MED ORDER — GABAPENTIN 100 MG PO CAPS: 100.0000 mg | ORAL_CAPSULE | Freq: Two times a day (BID) | ORAL | 3 refills | Status: DC

## 2022-08-15 NOTE — Patient Instructions (Signed)
Please return in 3 months for diabetes follow up and schedule a fasting lab appointment.  I will release your lab results to you on your MyChart account with further instructions. You may see the results before I do, but when I review them I will send you a message with my report or have my assistant call you if things need to be discussed. Please reply to my message with any questions. Thank you!   If you have any questions or concerns, please don't hesitate to send me a message via MyChart or call the office at [phone]. Thank you for visiting with [place] today! It's our pleasure caring for you.

## 2022-08-15 NOTE — Progress Notes (Signed)
Subjective  Chief Complaint  Patient presents with   Annual Exam    Pt here for Annual exam and is currently fasting    HPI: Wanda Medina is a 71 y.o. female who presents to [facility] [place] today for a Female Wellness Visit. She also has the concerns and/or needs as listed above in the chief complaint. These will be addressed in addition to the Health Maintenance Visit.   Wellness Visit: annual visit with health maintenance review and exam without Pap  HM: will get flu vaccine in October - we don't have HD flu shots in yet. Other screens current: gyn wellness, mammo and eye. CRC screen current. Doing well Chronic disease f/u and/or acute problem visit: (deemed necessary to be done in addition to the wellness visit): Diabetes: has cut back walking to 8 miles/day down from 10 although hasn't walked much in last two days. Fastings avg 100s but today was 130. Hasn't checked postprandials. Feels well w/o sxs of hyperglycemia. Diet is unchanged. Weight is stable. On trulicity 1.5 weekly and farxiga 10 daily. No adverse effects. No retinopathy. No foot sores/ remains on gabapentin for neuropathy which is well controlled.  HLD on statin; will need to return for lab work since our lab is closed today. No Adverse effects HTN on amlodipine and losartan. Feeling well. Taking medications w/o adverse effects. No symptoms of CHF, angina; no palpitations, sob, cp or lower extremity edema. Compliant with meds.  GERD is controlled with PPI> if misses doses will get reflux sxs back.  Due for b12 recheck.  Osteopenia on calcium and d and walks daily.    Assessment  1. Annual physical exam   2. Type 2 diabetes mellitus with stage 3b chronic kidney disease, without long-term current use of insulin (HCC)   3. Hypertension associated with diabetes (HCC)   4. Hyperlipidemia associated with type 2 diabetes mellitus (HCC)   5. Diabetic retinopathy of both eyes without macular  edema associated with type 2 diabetes mellitus, unspecified retinopathy severity (HCC)   6. Diabetic peripheral neuropathy (HCC)   7. Gastroesophageal reflux disease, unspecified whether esophagitis present   8. Osteopenia, unspecified location   9. B12 deficiency      Plan  Female Wellness Visit: Age appropriate Health Maintenance and Prevention measures were discussed with patient. Included topics are cancer screening recommendations, ways to keep healthy (see AVS) including dietary and exercise recommendations, regular eye and dental care, use of seat belts, and avoidance of moderate alcohol use and tobacco use.  Screens are current.  We will get records from eye doctor and GYN. BMI: discussed patient's BMI and encouraged positive lifestyle modifications to help get to or maintain a target BMI. HM needs and immunizations were addressed and ordered. See below for orders. See HM and immunization section for updates. Routine labs and screening tests ordered including cmp, cbc and lipids where appropriate. Discussed recommendations regarding Vit D and calcium supplementation (see AVS)  Chronic disease management visit and/or acute problem visit: Diabetic control has worsened.  This may be in fact due to her decreased walking.  She will increase her walking and start checking postprandials.  Recheck 3 months.  We will adjust medications up if A1c remains elevated.  We will also recheck A1c through serum to ensure it is correct.  We will also need to clarify her dose of Trulicity as it seems like she should be out of refills but she ensures that she is taking it.  Eye exam is current.  Check lab work. Hypertension is controlled.  Continue amlodipine 10 mg daily and losartan 100 mg daily.  Check renal function electrolytes Recheck fasting lipids on Crestor 10.  Check LFTs. Monitoring B12 on supplements Continue gabapentin twice daily for peripheral neuropathy. Continue D, calcium and walking.   Recheck bone density next year. GERD remains controlled on chronic PPI.  Omeprazole 20 mg daily.  Follow up: Return in about 3 months (around 11/14/2022) for follow up Diabetes.  Orders Placed This Encounter  Procedures   CBC with Differential/Platelet   Comprehensive metabolic panel   Hemoglobin A1c   Lipid panel   TSH   Vitamin B12   Microalbumin / creatinine urine ratio   POCT HgB A1C   Meds ordered this encounter  Medications   gabapentin (NEURONTIN) 100 MG capsule    Sig: Take 1 capsule (100 mg total) by mouth 2 (two) times daily.    Dispense:  180 capsule    Refill:  3   omeprazole (PRILOSEC) 20 MG capsule    Sig: Take 1 capsule (20 mg total) by mouth daily.    Dispense:  90 capsule    Refill:  3   dapagliflozin propanediol (FARXIGA) 10 MG TABS tablet    Sig: Take 1 tablet (10 mg total) by mouth daily before breakfast.    Dispense:  90 tablet    Refill:  3   Dulaglutide (TRULICITY) 1.5 MG/0.5ML SOPN    Sig: Inject 1.5 mg into the skin once a week.    Dispense:  6 mL    Refill:  3    Increasing dose from 0.75mg  weekly      Body mass index is 22.14 kg/m. Wt Readings from Last 3 Encounters:  08/15/22 117 lb 3.2 oz (53.2 kg)  05/14/22 117 lb (53.1 kg)  02/12/22 117 lb 9.6 oz (53.3 kg)     Patient Active Problem List   Diagnosis Date Noted   Diabetic peripheral neuropathy (HCC) 10/31/2021    Priority: High   Diabetic retinopathy of both eyes without macular edema associated with type 2 diabetes mellitus (HCC) 08/08/2021    Priority: High   Hyperlipidemia associated with type 2 diabetes mellitus (HCC) 08/01/2021    Priority: High   Type 2 diabetes mellitus with diabetic chronic kidney disease (HCC) 05/18/2019    Priority: High    Baseline creatinine: 1.3-1.4    Hypertension associated with diabetes (HCC) 10/27/2018    Priority: High   Osteoarthritis of knee, unspecified 11/12/2018    Priority: Medium    Osteopenia 11/12/2018    Priority: Medium     Dexa:  04/2020. tscore of -1.3. FRAX: 1.2%/9.3%. repeat 3 years. Continue calcium/vitamin D     GERD (gastroesophageal reflux disease) 10/27/2018    Priority: Medium    Arthritis 10/27/2018    Priority: Medium    B12 deficiency 11/20/2018    Priority: Low   Allergic rhinitis 10/27/2018    Priority: Low   Health Maintenance  Topic Date Due   COVID-19 Vaccine (5 - Pfizer series) 08/31/2022 (Originally 09/10/2021)   INFLUENZA VACCINE  03/10/2023 (Originally 07/10/2022)   HEMOGLOBIN A1C  02/13/2023   OPHTHALMOLOGY EXAM  04/05/2023   DEXA SCAN  05/03/2023   FOOT EXAM  05/15/2023   MAMMOGRAM  07/11/2023   TETANUS/TDAP  08/29/2026   COLONOSCOPY (Pts 45-83yrs Insurance coverage will need to be confirmed)  09/17/2027   Pneumonia Vaccine 29+ Years old  Completed   Hepatitis C Screening  Completed  Zoster Vaccines- Shingrix  Completed   HPV VACCINES  Aged Out   Immunization History  Administered Date(s) Administered   Fluad Quad(high Dose 65+) 10/11/2021   Influenza, High Dose Seasonal PF 12/18/2018   Influenza-Unspecified 11/20/2019, 09/18/2020   PFIZER(Purple Top)SARS-COV-2 Vaccination 01/23/2020, 02/17/2020, 09/18/2020, 07/16/2021   Pneumococcal Conjugate-13 09/09/2016   Pneumococcal Polysaccharide-23 10/27/2018   Zoster Recombinat (Shingrix) 08/24/2019, 11/20/2019   We updated and reviewed the patient's past history in detail and it is documented below. Allergies: Patient is allergic to ace inhibitors, altace [ramipril], and lisinopril. Past Medical History Patient  has a past medical history of Allergy, Arthritis, Chronic kidney disease, Diabetes mellitus without complication ([place]), GERD (gastroesophageal reflux disease), Hyperlipemia, and Hypertension. Past Surgical History Patient  has a past surgical history that includes Medial partial knee replacement; Colonoscopy; Trigger finger release; and Appendectomy. Family History: Patient family history includes Arthritis in her mother; COPD  in her father and mother; Heart attack in her brother, father, and mother. Social History:  Patient  reports that she has never smoked. She has never used smokeless tobacco. She reports that she does not drink alcohol and does not use drugs.  Review of Systems: Constitutional: negative for fever or malaise Ophthalmic: negative for photophobia, double vision or loss of vision Cardiovascular: negative for chest pain, dyspnea on exertion, or new LE swelling Respiratory: negative for SOB or persistent cough Gastrointestinal: negative for abdominal pain, change in bowel habits or melena Genitourinary: negative for dysuria or gross hematuria, no abnormal uterine bleeding or disharge Musculoskeletal: negative for new gait disturbance or muscular weakness Integumentary: negative for new or persistent rashes, no breast lumps Neurological: negative for TIA or stroke symptoms Psychiatric: negative for SI or delusions Allergic/Immunologic: negative for hives  Patient Care Team    Relationship Specialty Notifications Start End  Leamon Arnt, MD PCP - General Family Medicine  08/01/21     Objective  Vitals: BP 126/80   Pulse 68   Temp 97.7 F (36.5 C)   Ht 5\' 1"  (1.549 m)   Wt 117 lb 3.2 oz (53.2 kg)   LMP  (LMP Unknown)   SpO2 98%   BMI 22.14 kg/m  General:  Well developed, well nourished, no acute distress  Psych:  Alert and orientedx3,normal mood and affect HEENT:  Normocephalic, atraumatic, non-icteric sclera,  supple neck without adenopathy, mass or thyromegaly Cardiovascular:  Normal S1, S2, RRR without gallop, rub or murmur Respiratory:  Good breath sounds bilaterally, CTAB with normal respiratory effort Gastrointestinal: normal bowel sounds, soft, non-tender, no noted masses. No HSM MSK: no deformities, contusions. Joints are without erythema or swelling.  Skin:  Warm, no rashes or suspicious lesions noted  Lab Results  Component Value Date   HGBA1C 7.9 (A) 08/15/2022    HGBA1C 6.9 (A) 05/14/2022   HGBA1C 7.8 (A) 02/12/2022     Commons side effects, risks, benefits, and alternatives for medications and treatment plan prescribed today were discussed, and the patient expressed understanding of the given instructions. Patient is instructed to call or message via MyChart if he/she has any questions or concerns regarding our treatment plan. No barriers to understanding were identified. We discussed Red Flag symptoms and signs in detail. Patient expressed understanding regarding what to do in case of urgent or emergency type symptoms.  Medication list was reconciled, printed and provided to the patient in AVS. Patient instructions and summary information was reviewed with the patient as documented in the AVS. This note was prepared with assistance of Dragon  voice recognition software. Occasional wrong-word or sound-a-like substitutions may have occurred due to the inherent limitations of voice recognition software  This visit occurred during the SARS-CoV-2 public health emergency.  Safety protocols were in place, including screening questions prior to the visit, additional usage of staff PPE, and extensive cleaning of exam room while observing appropriate contact time as indicated for disinfecting solutions.

## 2022-08-17 ENCOUNTER — Encounter: Payer: Self-pay | Admitting: Family Medicine

## 2022-08-22 ENCOUNTER — Other Ambulatory Visit (INDEPENDENT_AMBULATORY_CARE_PROVIDER_SITE_OTHER): Payer: BC Managed Care – PPO

## 2022-08-22 DIAGNOSIS — E538 Deficiency of other specified B group vitamins: Secondary | ICD-10-CM | POA: Diagnosis not present

## 2022-08-22 DIAGNOSIS — E1159 Type 2 diabetes mellitus with other circulatory complications: Secondary | ICD-10-CM | POA: Diagnosis not present

## 2022-08-22 DIAGNOSIS — E1122 Type 2 diabetes mellitus with diabetic chronic kidney disease: Secondary | ICD-10-CM | POA: Diagnosis not present

## 2022-08-22 DIAGNOSIS — I152 Hypertension secondary to endocrine disorders: Secondary | ICD-10-CM

## 2022-08-22 DIAGNOSIS — N1832 Chronic kidney disease, stage 3b: Secondary | ICD-10-CM | POA: Diagnosis not present

## 2022-08-22 DIAGNOSIS — E1169 Type 2 diabetes mellitus with other specified complication: Secondary | ICD-10-CM | POA: Diagnosis not present

## 2022-08-22 DIAGNOSIS — E785 Hyperlipidemia, unspecified: Secondary | ICD-10-CM | POA: Diagnosis not present

## 2022-08-22 DIAGNOSIS — K219 Gastro-esophageal reflux disease without esophagitis: Secondary | ICD-10-CM

## 2022-08-22 DIAGNOSIS — Z0000 Encounter for general adult medical examination without abnormal findings: Secondary | ICD-10-CM

## 2022-08-22 LAB — MICROALBUMIN / CREATININE URINE RATIO
Creatinine,U: 21 mg/dL
Microalb Creat Ratio: 19.2 mg/g (ref 0.0–30.0)
Microalb, Ur: 4 mg/dL — ABNORMAL HIGH (ref 0.0–1.9)

## 2022-08-22 LAB — CBC WITH DIFFERENTIAL/PLATELET
Basophils Absolute: 0 10*3/uL (ref 0.0–0.1)
Basophils Relative: 1 % (ref 0.0–3.0)
Eosinophils Absolute: 0.6 10*3/uL (ref 0.0–0.7)
Eosinophils Relative: 11.9 % — ABNORMAL HIGH (ref 0.0–5.0)
HCT: 39.9 % (ref 36.0–46.0)
Hemoglobin: 13.1 g/dL (ref 12.0–15.0)
Lymphocytes Relative: 31.6 % (ref 12.0–46.0)
Lymphs Abs: 1.5 10*3/uL (ref 0.7–4.0)
MCHC: 33 g/dL (ref 30.0–36.0)
MCV: 91.9 fl (ref 78.0–100.0)
Monocytes Absolute: 0.4 10*3/uL (ref 0.1–1.0)
Monocytes Relative: 8.3 % (ref 3.0–12.0)
Neutro Abs: 2.2 10*3/uL (ref 1.4–7.7)
Neutrophils Relative %: 47.2 % (ref 43.0–77.0)
Platelets: 245 10*3/uL (ref 150.0–400.0)
RBC: 4.34 Mil/uL (ref 3.87–5.11)
RDW: 13.3 % (ref 11.5–15.5)
WBC: 4.7 10*3/uL (ref 4.0–10.5)

## 2022-08-22 LAB — LIPID PANEL
Cholesterol: 161 mg/dL (ref 0–200)
HDL: 77.9 mg/dL
LDL Cholesterol: 70 mg/dL (ref 0–99)
NonHDL: 83.49
Total CHOL/HDL Ratio: 2
Triglycerides: 69 mg/dL (ref 0.0–149.0)
VLDL: 13.8 mg/dL (ref 0.0–40.0)

## 2022-08-22 LAB — COMPREHENSIVE METABOLIC PANEL
ALT: 18 U/L (ref 0–35)
AST: 25 U/L (ref 0–37)
Albumin: 3.9 g/dL (ref 3.5–5.2)
Alkaline Phosphatase: 74 U/L (ref 39–117)
BUN: 18 mg/dL (ref 6–23)
CO2: 25 mEq/L (ref 19–32)
Calcium: 9.7 mg/dL (ref 8.4–10.5)
Chloride: 103 mEq/L (ref 96–112)
Creatinine, Ser: 1.25 mg/dL — ABNORMAL HIGH (ref 0.40–1.20)
GFR: 43.1 mL/min — ABNORMAL LOW (ref >60.00)
Glucose, Bld: 110 mg/dL — ABNORMAL HIGH (ref 70–99)
Potassium: 3.8 mEq/L (ref 3.5–5.1)
Sodium: 139 mEq/L (ref 135–145)
Total Bilirubin: 0.4 mg/dL (ref 0.2–1.2)
Total Protein: 7.5 g/dL (ref 6.0–8.3)

## 2022-08-22 LAB — TSH: TSH: 3.12 u[IU]/mL (ref 0.35–5.50)

## 2022-08-22 LAB — HEMOGLOBIN A1C: Hgb A1c MFr Bld: 8.5 % — ABNORMAL HIGH (ref 4.6–6.5)

## 2022-08-22 LAB — VITAMIN B12: Vitamin B-12: >1500 pg/mL — ABNORMAL HIGH (ref 211–911)

## 2022-08-30 ENCOUNTER — Encounter: Payer: Self-pay | Admitting: Family Medicine

## 2022-08-30 ENCOUNTER — Telehealth: Payer: Self-pay

## 2022-08-30 NOTE — Telephone Encounter (Signed)
Error

## 2022-08-30 NOTE — Telephone Encounter (Signed)
Spoke with pt and she is ok with starting on a nighttime insulin.

## 2022-09-01 MED ORDER — LANTUS SOLOSTAR 100 UNIT/ML ~~LOC~~ SOPN: 10.0000 [IU] | PEN_INJECTOR | Freq: Every day | SUBCUTANEOUS | 3 refills | Status: DC

## 2022-09-01 NOTE — Telephone Encounter (Signed)
Ordered lantus 10 nighlty.

## 2022-09-01 NOTE — Addendum Note (Signed)
Addended by: Billey Chang on: 09/01/2022 09:17 AM   Modules accepted: Orders

## 2022-09-07 ENCOUNTER — Other Ambulatory Visit: Payer: Self-pay | Admitting: Family Medicine

## 2022-09-16 ENCOUNTER — Other Ambulatory Visit: Payer: Self-pay | Admitting: Family Medicine

## 2022-09-18 ENCOUNTER — Telehealth: Payer: Self-pay | Admitting: Family Medicine

## 2022-09-18 NOTE — Telephone Encounter (Signed)
Pt scheduled for tomorrow at 1130

## 2022-09-18 NOTE — Telephone Encounter (Signed)
Patient Name: Wanda Medina Gender: Female DOB: [birth year] Age: 71 Y 31 M 9 D Return Phone Number: [phone] (Primary) Address: City/ State/ Zip: [place]  [address] Client [place] [facility] [place] [facility] [place] [facility] Provider Billey Chang- MD Contact Type Call Who Is Calling Patient / Member / Family / Caregiver Call Type Triage / Clinical Relationship To Patient Self Return Phone Number [phone] (Primary) Chief Complaint Blood Sugar Low Reason for Call Symptomatic / Request for Health Information Initial Comment Caller states she is having low blood sugar. At this time her blood sugar is at 77. Low blood sugar wakes her up Translation No Nurse Assessment Nurse: Toribio Harbour, RN, Joelene Millin Date/Time (Eastern Time): 09/18/2022 11:42:04 AM Confirm and document reason for call. If symptomatic, describe symptoms. ---Caller states she is having low blood sugar. At this time her blood sugar is at 77 as of 9:30AM. Low blood sugar woke her up. She is having no symptoms now. On 10/4, 10/5, and 10/7 she woke up at 6AM with blood sugars were in the 60's. She started insulin (Lantus) on 9/26. She takes 10 units at HS. Does the patient have any new or worsening symptoms? ---Yes Will a triage be completed? ---Yes Related visit to physician within the last 2 weeks? ---No Does the PT have any chronic conditions? (i.e. diabetes, asthma, this includes High risk factors for pregnancy, etc.) ---Yes List chronic conditions. ---diabetes, hyperlipidemia, HTN Is this a behavioral health or substance abuse call? ---No Guidelines Guideline Title Affirmed Question Affirmed Notes Nurse Date/Time (Eastern Time) Diabetes - Low Blood Sugar [1] Blood glucose 70 mg/dL (3.9 mmol/ L) or below OR symptomatic, now improved with Care Daves, RN, Joelene Millin 09/18/2022 11:45:13 AM PLEASE NOTE: All timestamps contained within this  report are represented as [place] Standard Time. CONFIDENTIALTY NOTICE: This fax transmission is intended only for the addressee. It contains information that is legally privileged, confidential or otherwise protected from use or disclosure. If you are not the intended recipient, you are strictly prohibited from reviewing, disclosing, copying using or disseminating any of this information or taking any action in reliance on or regarding this information. If you have received this fax in error, please notify us immediately by telephone so that we can arrange for its return to [place]. Phone: [phone], Toll-Free: [phone], Fax: [phone] Page: 2 of 2 Call Id: [id number] Guidelines Guideline Title Affirmed Question Affirmed Notes Nurse Date/Time Eilene Ghazi Time) Advice AND [2] cause unknown Disp. Time Eilene Ghazi Time) Disposition Final User 09/18/2022 11:48:47 AM Call PCP within 24 Hours Yes Toribio Harbour, RN, Joelene Millin Final Disposition 09/18/2022 11:48:47 AM Call PCP within 24 Hours Yes Toribio Harbour, RN, Renea Ee Disagree/Comply Comply Caller Understands Yes PreDisposition Call Doctor Care Advice Given Per Guideline CALL PCP WITHIN 24 HOURS: * You need to discuss this with your doctor (or NP/PA) within the next 24 hours. * If you have NO SYMPTOMS of low blood sugar and your blood glucose is 70 mg/dL (3.9 mmol/L) or below, * You become worse CALL BACK IF: CARE ADVICE given per Diabetes - Low Blood Sugar (Adult) guideline. Referrals REFERRED TO PCP OFFICE

## 2022-09-18 NOTE — Telephone Encounter (Signed)
Pt states: -b-12 was high -currently taking 1,00 mg 3 times a week. -Not sure when to have vax boosters   Pt asks -Should she reduce dosage or stop taking B12 supplement. -When should she get her COVID-19 and Flu booster shots?   Pt requests: -Call back

## 2022-09-18 NOTE — Telephone Encounter (Signed)
Pt States: -Taking insulin 10 units as prescribed. -09/18/22 at 6:00 am she was awoken "by low sugar" //  readings were 65,69, then 64  -asking for advice on medication management.   Pt transferred to [facility], patient coordinator for PCP Triage [facility] Nurse.  Awaiting follow up notes.

## 2022-09-19 ENCOUNTER — Ambulatory Visit: Payer: BC Managed Care – PPO | Admitting: Family Medicine

## 2022-09-19 ENCOUNTER — Encounter: Payer: Self-pay | Admitting: Family Medicine

## 2022-09-19 VITALS — BP 138/82 | HR 82 | Temp 97.8°F | Ht 61.0 in | Wt 115.2 lb

## 2022-09-19 DIAGNOSIS — E1122 Type 2 diabetes mellitus with diabetic chronic kidney disease: Secondary | ICD-10-CM

## 2022-09-19 DIAGNOSIS — E1159 Type 2 diabetes mellitus with other circulatory complications: Secondary | ICD-10-CM | POA: Diagnosis not present

## 2022-09-19 DIAGNOSIS — N1832 Chronic kidney disease, stage 3b: Secondary | ICD-10-CM

## 2022-09-19 DIAGNOSIS — E538 Deficiency of other specified B group vitamins: Secondary | ICD-10-CM | POA: Diagnosis not present

## 2022-09-19 DIAGNOSIS — E1142 Type 2 diabetes mellitus with diabetic polyneuropathy: Secondary | ICD-10-CM | POA: Diagnosis not present

## 2022-09-19 DIAGNOSIS — I152 Hypertension secondary to endocrine disorders: Secondary | ICD-10-CM

## 2022-09-19 NOTE — Progress Notes (Signed)
Subjective  CC:  Chief Complaint  Patient presents with   Low Blood sugars    Pt stated that she has been having some low BS since 09/12/2022    HPI: Wanda Medina is a 71 y.o. female who presents to the office today for follow up of diabetes and problems listed above in the chief complaint.  Diabetes follow up: In August, A1c was elevated.  This was due to her change in activity level, specifically decreasing walking from 10 miles per day down to 7 or 8 miles per day.  At that time, I started Lantus 10 units nightly.  However, she has increased her walking back to 10 miles per day.  She walks a 15-minute mile with an incline of 9.  She enjoys this immensely.  Says it is good for her brain and physical health.  She does not get tired.  She does not have pain.  No chest pain or shortness of breath.  She has lost 2 pounds since doing this increase again.  Unfortunately, now having low blood sugars, fastings are around 60.  She was symptomatic a few times.  Continues on Farxiga 10 nightly and Trulicity 1.5 mg weekly.  She otherwise feels well. Vitamin B12 deficiency: B12 levels are very high on B12 supplements 3 times weekly. Blood pressure: Hypertension has been well controlled on ACE inhibitor.  Running a little bit high for her today she says this is because she is stressed.  She is planning to go to [place] in December but worries because she will not be able to walk like she wants to.  Wt Readings from Last 3 Encounters:  09/19/22 115 lb 3.2 oz (52.3 kg)  08/15/22 117 lb 3.2 oz (53.2 kg)  05/14/22 117 lb (53.1 kg)    BP Readings from Last 3 Encounters:  09/19/22 138/82  08/15/22 126/80  05/14/22 138/76    Assessment  1. Type 2 diabetes mellitus with stage 3b chronic kidney disease, without long-term current use of insulin ([place])   2. Hypertension associated with diabetes ([place])   3. B12 deficiency   4. Diabetic peripheral neuropathy ([place])      Plan  Diabetes with  hyperglycemia: Patient's activity level is now back to its normal levels.  This is controlled her diabetes well in the past.  We will stop Lantus.  Continue Farxiga and Trulicity.  Continue walking.  Education on diet.  I do not want her to lose anymore weight.  We will recheck levels in 2 to 3 months.  She will continue fasting home glucose monitoring.  She will monitor for lows and let me know if she has any more, although this is unlikely. Hypertension: Reassured.  Has been well controlled.  Stress reaction today, albeit mild.  Counseling done.  I recommend that she go to [place], enjoyed her visit.  Will be fine if she walks less during those 2 weeks. B12 deficiency: Stop B12 supplements and recheck in 2-3 months Peripheral neuropathy stable on gabapentin.  No foot sores.  Discussed foot care  Follow up: .As scheduled No orders of the defined types were placed in this encounter.  No orders of the defined types were placed in this encounter.     Immunization History  Administered Date(s) Administered   Fluad Quad(high Dose 65+) 10/11/2021   Influenza, High Dose Seasonal PF 12/18/2018   Influenza-Unspecified 11/20/2019, 09/18/2020   PFIZER(Purple Top)SARS-COV-2 Vaccination 01/23/2020, 02/17/2020, 09/18/2020, 07/16/2021   Pneumococcal Conjugate-13 09/09/2016   Pneumococcal Polysaccharide-23  10/27/2018   Zoster Recombinat (Shingrix) 08/24/2019, 11/20/2019    Diabetes Related Lab Review: Lab Results  Component Value Date   HGBA1C 8.5 (H) 08/22/2022   HGBA1C 7.9 (A) 08/15/2022   HGBA1C 6.9 (A) 05/14/2022    Lab Results  Component Value Date   MICROALBUR 4.0 (H) 08/22/2022   Lab Results  Component Value Date   CREATININE 1.25 (H) 08/22/2022   BUN 18 08/22/2022   NA 139 08/22/2022   K 3.8 08/22/2022   CL 103 08/22/2022   CO2 25 08/22/2022   Lab Results  Component Value Date   CHOL 161 08/22/2022   CHOL 146 08/01/2021   CHOL 167 02/20/2021   Lab Results  Component Value  Date   HDL 77.90 08/22/2022   HDL 63.00 08/01/2021   HDL 65.50 02/20/2021   Lab Results  Component Value Date   LDLCALC 70 08/22/2022   LDLCALC 66 08/01/2021   LDLCALC 76 02/20/2021   Lab Results  Component Value Date   TRIG 69.0 08/22/2022   TRIG 85.0 08/01/2021   TRIG 130.0 02/20/2021   Lab Results  Component Value Date   CHOLHDL 2 08/22/2022   CHOLHDL 2 08/01/2021   CHOLHDL 3 02/20/2021   No results found for: "LDLDIRECT" The 10-year ASCVD risk score (Arnett DK, et al., 2019) is: 29.4%   Values used to calculate the score:     Age: 28 years     Sex: Female     Is Non-Hispanic African American: No     Diabetic: Yes     Tobacco smoker: No     Systolic Blood Pressure: 193 mmHg     Is BP treated: Yes     HDL Cholesterol: 77.9 mg/dL     Total Cholesterol: 161 mg/dL I have reviewed the PMH, Fam and Soc history. Patient Active Problem List   Diagnosis Date Noted   Diabetic peripheral neuropathy ([place]) 10/31/2021    Priority: High   Diabetic retinopathy of both eyes without macular edema associated with type 2 diabetes mellitus ([place]) 08/08/2021    Priority: High   Hyperlipidemia associated with type 2 diabetes mellitus ([place]) 08/01/2021    Priority: High   Type 2 diabetes mellitus with diabetic chronic kidney disease ([place]) 05/18/2019    Priority: High    Baseline creatinine: 1.3-1.4    Hypertension associated with diabetes ([place]) 10/27/2018    Priority: High   Osteoarthritis of knee, unspecified 11/12/2018    Priority: Medium    Osteopenia 11/12/2018    Priority: Medium     Dexa: 04/2020. tscore of -1.3. FRAX: 1.2%/9.3%. repeat 3 years. Continue calcium/vitamin D     GERD (gastroesophageal reflux disease) 10/27/2018    Priority: Medium    Arthritis 10/27/2018    Priority: Medium    B12 deficiency 11/20/2018    Priority: Low   Allergic rhinitis 10/27/2018    Priority: Low    Social History: Patient  reports that she has never smoked. She has never used  smokeless tobacco. She reports that she does not drink alcohol and does not use drugs.  Review of Systems: Ophthalmic: negative for eye pain, loss of vision or double vision Cardiovascular: negative for chest pain Respiratory: negative for SOB or persistent cough Gastrointestinal: negative for abdominal pain Genitourinary: negative for dysuria or gross hematuria MSK: negative for foot lesions Neurologic: negative for weakness or gait disturbance  Objective  Vitals: BP 138/82   Pulse 82   Temp 97.8 F (36.6 C)   Ht 5'  1" (1.549 m)   Wt 115 lb 3.2 oz (52.3 kg)   LMP  (LMP Unknown)   SpO2 99%   BMI 21.77 kg/m  General: well appearing, no acute distress  Psych:  Alert and oriented, normal mood and affect HEENT:  Normocephalic, atraumatic, moist mucous membranes, supple neck  Cardiovascular:  Nl S1 and S2, RRR without murmur, gallop or rub. no edema Respiratory:  Good breath sounds bilaterally, CTAB with normal effort, no rales     Diabetic education: ongoing education regarding chronic disease management for diabetes was given today. We continue to reinforce the ABC's of diabetic management: A1c (<7 or 8 dependent upon patient), tight blood pressure control, and cholesterol management with goal LDL < 100 minimally. We discuss diet strategies, exercise recommendations, medication options and possible side effects. At each visit, we review recommended immunizations and preventive care recommendations for diabetics and stress that good diabetic control can prevent other problems. See below for this patient's data.   Commons side effects, risks, benefits, and alternatives for medications and treatment plan prescribed today were discussed, and the patient expressed understanding of the given instructions. Patient is instructed to call or message via MyChart if he/she has any questions or concerns regarding our treatment plan. No barriers to understanding were identified. We discussed Red  Flag symptoms and signs in detail. Patient expressed understanding regarding what to do in case of urgent or emergency type symptoms.  Medication list was reconciled, printed and provided to the patient in AVS. Patient instructions and summary information was reviewed with the patient as documented in the AVS. This note was prepared with assistance of Dragon voice recognition software. Occasional wrong-word or sound-a-like substitutions may have occurred due to the inherent limitations of voice recognition software

## 2022-09-19 NOTE — Telephone Encounter (Signed)
Has OV with me this am to address.

## 2022-09-19 NOTE — Patient Instructions (Addendum)
Please follow up as scheduled for your next visit with me: 11/19/2022 for diabetes and vitamin b12 recheck.  If you have any questions or concerns, please don't hesitate to send me a message via MyChart or call the office at [phone]. Thank you for visiting with [place] today! It's our pleasure caring for you.

## 2022-10-31 ENCOUNTER — Ambulatory Visit (INDEPENDENT_AMBULATORY_CARE_PROVIDER_SITE_OTHER): Payer: BC Managed Care – PPO | Admitting: Internal Medicine

## 2022-10-31 ENCOUNTER — Encounter: Payer: Self-pay | Admitting: Internal Medicine

## 2022-10-31 VITALS — BP 116/78 | HR 93 | Temp 97.7°F | Resp 14 | Ht 61.0 in | Wt 113.2 lb

## 2022-10-31 DIAGNOSIS — R051 Acute cough: Secondary | ICD-10-CM

## 2022-10-31 DIAGNOSIS — J329 Chronic sinusitis, unspecified: Secondary | ICD-10-CM | POA: Diagnosis not present

## 2022-10-31 DIAGNOSIS — U071 COVID-19: Secondary | ICD-10-CM

## 2022-10-31 DIAGNOSIS — R0989 Other specified symptoms and signs involving the circulatory and respiratory systems: Secondary | ICD-10-CM | POA: Diagnosis not present

## 2022-10-31 LAB — POC COVID19 BINAXNOW: SARS Coronavirus 2 Ag: POSITIVE — AB

## 2022-10-31 MED ORDER — SIMPLY SALINE 0.9 % NA AERS: 2.0000 | INHALATION_SPRAY | Freq: Every day | NASAL | 1 refills | Status: DC | PRN

## 2022-10-31 MED ORDER — LORATADINE 10 MG PO TABS: 10.0000 mg | ORAL_TABLET | Freq: Every day | ORAL | 11 refills | Status: DC

## 2022-10-31 MED ORDER — PSEUDOEPHEDRINE HCL ER 120 MG PO TB12: 120.0000 mg | ORAL_TABLET | Freq: Two times a day (BID) | ORAL | 0 refills | Status: DC

## 2022-10-31 MED ORDER — AMOXICILLIN-POT CLAVULANATE 875-125 MG PO TABS: 1.0000 | ORAL_TABLET | Freq: Two times a day (BID) | ORAL | 0 refills | Status: DC

## 2022-10-31 MED ORDER — FLUTICASONE PROPIONATE 50 MCG/ACT NA SUSP: 2.0000 | Freq: Every day | NASAL | 6 refills | Status: AC

## 2022-10-31 NOTE — Patient Instructions (Signed)
It was a pleasure seeing you today! I truly hope you feel like you received 5 star service and please let me know if there is anything I can improve.  Lula Olszewski, MD   Today the plan is...  Take all of the decongestants as prescribed below and if you get fevers/worsening it is okay to take the Augmentin/amoxicillin-clavulanate antibiotic treatment but it would only help bacterial superinfection and I think your main infection is viral COVID at this time  Please go to our [facility] office to get your xrays done. You can walk in M-F between 8:30am- noon or 1pm - 5pm. Tell them you are there for xrays ordered by me. They will send me the results, then I will let you know the results with instructions.   Address: [address]  The Xray department is located in the basement.    Acute cough -     POC COVID-19 BinaxNow  Chronic sinusitis, unspecified location -     Amoxicillin-Pot Clavulanate; Take 1 tablet by mouth 2 (two) times daily.  Dispense: 20 tablet; Refill: 0 -     DG Chest 2 View; Future -     Fluticasone Propionate; Place 2 sprays into both nostrils daily.  Dispense: 16 g; Refill: 6 -     Simply Saline; Place 2 each into the nose daily as needed.  Dispense: 500 mL; Refill: 1 -     Loratadine; Take 1 tablet (10 mg total) by mouth daily.  Dispense: 30 tablet; Refill: 11 -     Pseudoephedrine HCl ER; Take 1 tablet (120 mg total) by mouth 2 (two) times daily.  Dispense: 20 tablet; Refill: 0  COVID -     DG Chest 2 View; Future  Lung crackles -     DG Chest 2 View; Future         [x]  RETURN TO CLINIC: No follow-ups on file.   - If you are not doing well: RETURN to the office sooner. - Please bring all your medicines to each appointment.  - If your condition begins to worsen or become severe:  GO to the ER.  [x]  QUESTIONS/CONCERNS:  If you have follow-up questions / concerns:  - CLINICAL: please contact me via phone [phone] OR MyChart messaging   - LAB & IMAGING RESULTS you will be contacted with the lab results as soon as they are available. For any labs or imaging tests, we will call you if the results are significantly abnormal.  Most normal results will be posted to myChart as soon as they are available and I will comment on them there within 2-3 business days.  The fastest way to get your results is to activate your My Chart account. Instructions are located on the last page of this paperwork. If you have not heard from regarding the results in 2 weeks, please contact this office.  - BILLING: xray and lab orders are billed from separate companies and questions./concerns should be directed to the invoicing company.  For visit charges please discuss with our administrative services

## 2022-10-31 NOTE — Assessment & Plan Note (Signed)
There are crackles in the left lower lobe of the lung but otherwise this just looks like a severe sinus infection for about 7 to 8 days I am concerned it may be complicated by pneumonia even though she can walk a mile without any shortness of breath and has not have any systemic symptoms  I spoke with her about how the partially positive COVID test is most likely a slowly resolving COVID infection and I advised that she quarantine for a bit longer and not take antibiotics however we are about to go for Thanksgiving weekend and it would be difficult to get back in so I went ahead and prescribed antibiotic for her to take with the instructions to only take it if she gets a fever or starts seeing pus coming out of her sinuses

## 2022-10-31 NOTE — Progress Notes (Signed)
Adult nurse [facility] at [facility]:  [phone]   Routine Medical Office Visit  Patient:  Wanda Medina      Age: 71 y.o.       Sex:  female  Date:   10/31/2022  PCP:    Willow Ora, MD    Today's Healthcare Provider: Lula Olszewski, MD  Assessment/Plan:   Nazia was seen today for nasal congestion, cough and fatigue.  COVID -     DG Chest 2 View; Future  Acute cough -     POC COVID-19 BinaxNow  Chronic sinusitis, unspecified location Overview: Nasal Congestion Runny nose, possibly RSV?     Cough Symptoms present since last Tuesday, had a low grade fever (only that night), Took tylenol, Mucinex and Benadryl, Sudafed with some relief.     Fatigue Weakness.    - total 6 days - never been sick like this - had fever 1 night last week - denies myalgias, but lots of fatigue, and 1 night fever - still able to walk a mile this am without dyspnea - taking tylenol and benadryl mucinex and sudafed, they help some - lots of throat and nose discharge,some a little bloody, but no pus and no sore throat - most bothersome symptoms are cough productive  - denies any pus production     Assessment & Plan: There are crackles in the left lower lobe of the lung but otherwise this just looks like a severe sinus infection for about 7 to 8 days I am concerned it may be complicated by pneumonia even though she can walk a mile without any shortness of breath and has not have any systemic symptoms  I spoke with her about how the partially positive COVID test is most likely a slowly resolving COVID infection and I advised that she quarantine for a bit longer and not take antibiotics however we are about to go for Thanksgiving weekend and it would be difficult to get back in so I went ahead and prescribed antibiotic for her to take with the instructions to only take it if she gets a fever or starts seeing pus coming out of her sinuses  Orders: -     Amoxicillin-Pot  Clavulanate; Take 1 tablet by mouth 2 (two) times daily.  Dispense: 20 tablet; Refill: 0 -     DG Chest 2 View; Future -     Fluticasone Propionate; Place 2 sprays into both nostrils daily.  Dispense: 16 g; Refill: 6 -     Simply Saline; Place 2 each into the nose daily as needed.  Dispense: 500 mL; Refill: 1 -     Loratadine; Take 1 tablet (10 mg total) by mouth daily.  Dispense: 30 tablet; Refill: 11 -     Pseudoephedrine HCl ER; Take 1 tablet (120 mg total) by mouth 2 (two) times daily.  Dispense: 20 tablet; Refill: 0  Lung crackles -     DG Chest 2 View; Future     Return if symptoms worsen or fail to improve.   Today's key discussion points: She was encouraged to contact our office by phone or message via MyChart if she has any questions or concerns regarding our treatment plan (see AVS).  Common side effects, risks, benefits, and alternatives for medications and treatment plan prescribed today were discussed, and she expressed understanding of the given instructions.  We discussed red flag symptoms and signs in detail and when to call the office or go to ER if her  condition worsens (see AFTER VISIT SUMMARY). She expressed understanding.  No barriers to understanding were identified - her english is fluent as 2nd language. Medication list was reconciled and patient instructions and summary information was documented and made available for her to review in the AVS (see AVS).  This note is also available to patient for review for accuracy and understanding.      Subjective:   Wanda Medina is a 71 y.o. female with past medical history including: Past Medical History:  Diagnosis Date   Allergy    Arthritis    Chronic kidney disease    AKI saw Nephrologist in [place]   Diabetes mellitus without complication (HCC)    GERD (gastroesophageal reflux disease)    Hyperlipemia    Hypertension      She presented today reporting reason for visit as: Chief Complaint   Patient presents with   Nasal Congestion    Runny nose, possibly RSV?   Cough    Symptoms present since last Tuesday, had a low grade fever (only that night), Took tylenol, Mucinex and Benadryl, Sudafed with some relief.   Fatigue    Weakness.       Problem focused charting was used to record today's medical interview as follows: Problem  Chronic Sinusitis   Nasal Congestion Runny nose, possibly RSV?     Cough Symptoms present since last Tuesday, had a low grade fever (only that night), Took tylenol, Mucinex and Benadryl, Sudafed with some relief.     Fatigue Weakness.    - total 6 days - never been sick like this - had fever 1 night last week - denies myalgias, but lots of fatigue, and 1 night fever - still able to walk a mile this am without dyspnea - taking tylenol and benadryl mucinex and sudafed, they help some - lots of throat and nose discharge,some a little bloody, but no pus and no sore throat - most bothersome symptoms are cough productive  - denies any pus production         Review of Systems  Constitutional:  Positive for fever and malaise/fatigue. Negative for chills, diaphoresis and weight loss.  HENT:  Positive for sinus pain. Negative for congestion, ear discharge, ear pain, hearing loss, nosebleeds (tiny bit of blood with blowing nose only), sore throat and tinnitus.   Eyes:  Negative for blurred vision, double vision, photophobia, pain, discharge and redness.  Respiratory:  Positive for cough and sputum production. Negative for hemoptysis, shortness of breath, wheezing and stridor.   Cardiovascular:  Negative for chest pain, palpitations, orthopnea, claudication, leg swelling and PND.  Gastrointestinal:  Negative for abdominal pain, blood in stool, constipation, diarrhea, heartburn, melena, nausea and vomiting.  Genitourinary:  Negative for dysuria, flank pain, frequency, hematuria and urgency.  Musculoskeletal:  Negative for back pain, falls, joint pain,  myalgias and neck pain.  Skin:  Negative for itching and rash.  Neurological:  Negative for dizziness, tingling, tremors, sensory change, speech change, focal weakness, seizures, loss of consciousness, weakness and headaches.  Endo/Heme/Allergies:  Positive for environmental allergies (sneezing not much). Negative for polydipsia. Does not bruise/bleed easily.  Psychiatric/Behavioral:  Negative for depression, hallucinations, memory loss, substance abuse and suicidal ideas. The patient is not nervous/anxious and does not have insomnia.            Objective:  Physical Exam: BP 116/78 (BP Location: Left Arm, Patient Position: Sitting)   Pulse 93   Temp 97.7 F (36.5 C) (Temporal)   Resp  14   Ht 5\' 1"  (1.549 m)   Wt 113 lb 3.2 oz (51.3 kg)   LMP  (LMP Unknown)   SpO2 98%   BMI 21.39 kg/m   Problem-specific physical exam findings:  Physical Exam Vitals and nursing note reviewed.  Constitutional:      General: She is not in acute distress.    Appearance: She is ill-appearing. She is not toxic-appearing or diaphoretic.     Comments: This is a very pleasant and polite person.    HENT:     Head: Normocephalic and atraumatic. Hair is abnormal (global hair thinnning).     Jaw: No swelling.     Salivary Glands: Right salivary gland is not diffusely enlarged. Left salivary gland is not diffusely enlarged.     Right Ear: Tympanic membrane, ear canal and external ear normal.     Left Ear: Tympanic membrane, ear canal and external ear normal.     Nose: Mucosal edema, congestion and rhinorrhea present. No signs of injury. Rhinorrhea is bloody.     Right Nostril: Epistaxis present. No septal hematoma or occlusion.     Left Nostril: Epistaxis present. No septal hematoma or occlusion.     Right Turbinates: Enlarged and swollen.     Left Turbinates: Enlarged and swollen.     Mouth/Throat:     Mouth: Mucous membranes are moist.     Pharynx: Oropharynx is clear.  Eyes:     General: No scleral  icterus.    Conjunctiva/sclera: Conjunctivae normal.  Cardiovascular:     Rate and Rhythm: Normal rate and regular rhythm.     Heart sounds: Normal heart sounds. No murmur heard. Pulmonary:     Effort: Pulmonary effort is normal.     Breath sounds: Rales (esp lll) present.  Abdominal:     General: There is no distension.     Palpations: Abdomen is soft. There is no mass.     Tenderness: There is no abdominal tenderness. There is no guarding.  Skin:    General: Skin is warm and dry.     Coloration: Skin is not jaundiced.     Findings: No bruising.  Neurological:     General: No focal deficit present.     Mental Status: She is alert.  Psychiatric:        Mood and Affect: Mood normal.        Behavior: Behavior normal.        Thought Content: Thought content normal.        Judgment: Judgment normal.     Results Reviewed:  Results for orders placed or performed in visit on 10/31/22  POC COVID-19  Result Value Ref Range   SARS Coronavirus 2 Ag Positive (A) Negative     Recent Results (from the past 2160 hour(s))  POCT HgB A1C     Status: Abnormal   Collection Time: 08/15/22 10:35 AM  Result Value Ref Range   Hemoglobin A1C 7.9 (A) 4.0 - 5.6 %   HbA1c POC (<> result, manual entry)     HbA1c, POC (prediabetic range)     HbA1c, POC (controlled diabetic range)    Microalbumin / creatinine urine ratio     Status: Abnormal   Collection Time: 08/22/22  8:56 AM  Result Value Ref Range   Microalb, Ur 4.0 (H) 0.0 - 1.9 mg/dL   Creatinine,U 08/24/22 mg/dL   Microalb Creat Ratio 19.2 0.0 - 30.0 mg/g  Vitamin B12     Status: Abnormal  Collection Time: 08/22/22  8:56 AM  Result Value Ref Range   Vitamin B-12 >1500 (H) 211 - 911 pg/mL  TSH     Status: None   Collection Time: 08/22/22  8:56 AM  Result Value Ref Range   TSH 3.12 0.35 - 5.50 uIU/mL  Lipid panel     Status: None   Collection Time: 08/22/22  8:56 AM  Result Value Ref Range   Cholesterol 161 0 - 200 mg/dL    Comment:  ATP III Classification       Desirable:  < 200 mg/dL               Borderline High:  200 - 239 mg/dL          High:  > = 161240 mg/dL   Triglycerides 09.669.0 0.0 - 149.0 mg/dL    Comment: Normal:  <045<150 mg/dLBorderline High:  150 - 199 mg/dL   HDL 40.9877.90 >11.91>39.00 mg/dL   VLDL 47.813.8 0.0 - 29.540.0 mg/dL   LDL Cholesterol 70 0 - 99 mg/dL   Total CHOL/HDL Ratio 2     Comment:                Men          Women1/2 Average Risk     3.4          3.3Average Risk          5.0          4.42X Average Risk          9.6          7.13X Average Risk          15.0          11.0                       NonHDL 83.49     Comment: NOTE:  Non-HDL goal should be 30 mg/dL higher than patient's LDL goal (i.e. LDL goal of < 70 mg/dL, would have non-HDL goal of < 100 mg/dL)  Hemoglobin A2ZA1c     Status: Abnormal   Collection Time: 08/22/22  8:56 AM  Result Value Ref Range   Hgb A1c MFr Bld 8.5 (H) 4.6 - 6.5 %    Comment: Glycemic Control Guidelines for People with Diabetes:Non Diabetic:  <6%Goal of Therapy: <7%Additional Action Suggested:  >8%   Comprehensive metabolic panel     Status: Abnormal   Collection Time: 08/22/22  8:56 AM  Result Value Ref Range   Sodium 139 135 - 145 mEq/L   Potassium 3.8 3.5 - 5.1 mEq/L   Chloride 103 96 - 112 mEq/L   CO2 25 19 - 32 mEq/L   Glucose, Bld 110 (H) 70 - 99 mg/dL   BUN 18 6 - 23 mg/dL   Creatinine, Ser 3.081.25 (H) 0.40 - 1.20 mg/dL   Total Bilirubin 0.4 0.2 - 1.2 mg/dL   Alkaline Phosphatase 74 39 - 117 U/L   AST 25 0 - 37 U/L   ALT 18 0 - 35 U/L   Total Protein 7.5 6.0 - 8.3 g/dL   Albumin 3.9 3.5 - 5.2 g/dL   GFR 65.7843.10 (L) >46.96>60.00 mL/min    Comment: Calculated using the CKD-EPI Creatinine Equation (2021)   Calcium 9.7 8.4 - 10.5 mg/dL  CBC with Differential/Platelet     Status: Abnormal   Collection Time: 08/22/22  8:56 AM  Result Value Ref Range   WBC 4.7 4.0 -  10.5 K/uL   RBC 4.34 3.87 - 5.11 Mil/uL   Hemoglobin 13.1 12.0 - 15.0 g/dL   HCT 16.1 09.6 - 04.5 %   MCV 91.9 78.0 -  100.0 fl   MCHC 33.0 30.0 - 36.0 g/dL   RDW 40.9 81.1 - 91.4 %   Platelets 245.0 150.0 - 400.0 K/uL   Neutrophils Relative % 47.2 43.0 - 77.0 %   Lymphocytes Relative 31.6 12.0 - 46.0 %   Monocytes Relative 8.3 3.0 - 12.0 %   Eosinophils Relative 11.9 (H) 0.0 - 5.0 %   Basophils Relative 1.0 0.0 - 3.0 %   Neutro Abs 2.2 1.4 - 7.7 K/uL   Lymphs Abs 1.5 0.7 - 4.0 K/uL   Monocytes Absolute 0.4 0.1 - 1.0 K/uL   Eosinophils Absolute 0.6 0.0 - 0.7 K/uL   Basophils Absolute 0.0 0.0 - 0.1 K/uL  POC COVID-19     Status: Abnormal   Collection Time: 10/31/22 12:10 PM  Result Value Ref Range   SARS Coronavirus 2 Ag Positive (A) Negative

## 2022-11-19 ENCOUNTER — Encounter: Payer: Self-pay | Admitting: Family Medicine

## 2022-11-19 ENCOUNTER — Ambulatory Visit: Payer: BC Managed Care – PPO | Admitting: Family Medicine

## 2022-11-19 VITALS — BP 132/74 | HR 71 | Temp 97.7°F | Ht 61.0 in | Wt 113.6 lb

## 2022-11-19 DIAGNOSIS — E538 Deficiency of other specified B group vitamins: Secondary | ICD-10-CM

## 2022-11-19 DIAGNOSIS — N1832 Chronic kidney disease, stage 3b: Secondary | ICD-10-CM | POA: Diagnosis not present

## 2022-11-19 DIAGNOSIS — U071 COVID-19: Secondary | ICD-10-CM

## 2022-11-19 DIAGNOSIS — I152 Hypertension secondary to endocrine disorders: Secondary | ICD-10-CM

## 2022-11-19 DIAGNOSIS — E1159 Type 2 diabetes mellitus with other circulatory complications: Secondary | ICD-10-CM | POA: Diagnosis not present

## 2022-11-19 DIAGNOSIS — E1122 Type 2 diabetes mellitus with diabetic chronic kidney disease: Secondary | ICD-10-CM

## 2022-11-19 LAB — POCT GLYCOSYLATED HEMOGLOBIN (HGB A1C): Hemoglobin A1C: 7.1 % — AB (ref 4.0–5.6)

## 2022-11-19 NOTE — Progress Notes (Signed)
Subjective  CC:  Chief Complaint  Patient presents with   Diabetes    HPI: Wanda Medina is a 71 y.o. female who presents to the office today for follow up of diabetes and problems listed above in the chief complaint.  Diabetes follow DG:[id number] is currently adequately controlled. See last visit: we stopped lantus 10 in October. On trulicity 1.5 and farxiga 10 and exercising however, came down with covid in November and since, had been fatigued and winded so exercise was low. She is now recovered and building her strength back up. No more cough or sob. Fastings sugars remain stable.   She denies foot sores or paresthesias.  She will be traveling to [place] for a month; leaving on dec 25th.  Bp control is stable. No cp  B12: now off of oral supplements. Feels well.   Wt Readings from Last 3 Encounters:  11/19/22 113 lb 9.6 oz (51.5 kg)  10/31/22 113 lb 3.2 oz (51.3 kg)  09/19/22 115 lb 3.2 oz (52.3 kg)    BP Readings from Last 3 Encounters:  11/19/22 132/74  10/31/22 116/78  09/19/22 138/82    Assessment  1. Type 2 diabetes mellitus with stage 3b chronic kidney disease, without long-term current use of insulin (HCC)   2. Hypertension associated with diabetes (HCC)   3. B12 deficiency      Plan   DM2: control is fair; complicated by covid infection and significant change in diet and activity level. Now improved. Will continue meds w/o changes and recheck in 3 months. Can adjust meds at that time if A1c is trending back up.  HTN is controlled.  Will recheck b12 levels in 3 months.  Recovered from covid; normal lung exam today  Follow up: 3 mo for diabetes recheck. Orders Placed This Encounter  Procedures   POCT HgB A1C   No orders of the defined types were placed in this encounter.     Immunization History  Administered Date(s) Administered   Fluad Quad(high Dose 65+) 10/11/2021   Influenza, High Dose Seasonal PF 12/18/2018   Influenza-Unspecified  11/20/2019, 09/18/2020   PFIZER(Purple Top)SARS-COV-2 Vaccination 01/23/2020, 02/17/2020, 09/18/2020, 07/16/2021   Pfizer Covid-19 Vaccine Bivalent Booster 18yrs & up 11/04/2021   Pneumococcal Conjugate-13 09/09/2016   Pneumococcal Polysaccharide-23 10/27/2018   Zoster Recombinat (Shingrix) 08/24/2019, 11/20/2019    Diabetes Related Lab Review: Lab Results  Component Value Date   HGBA1C 7.1 (A) 11/19/2022   HGBA1C 8.5 (H) 08/22/2022   HGBA1C 7.9 (A) 08/15/2022    Lab Results  Component Value Date   MICROALBUR 4.0 (H) 08/22/2022   Lab Results  Component Value Date   CREATININE 1.25 (H) 08/22/2022   BUN 18 08/22/2022   NA 139 08/22/2022   K 3.8 08/22/2022   CL 103 08/22/2022   CO2 25 08/22/2022   Lab Results  Component Value Date   CHOL 161 08/22/2022   CHOL 146 08/01/2021   CHOL 167 02/20/2021   Lab Results  Component Value Date   HDL 77.90 08/22/2022   HDL 63.00 08/01/2021   HDL 65.50 02/20/2021   Lab Results  Component Value Date   LDLCALC 70 08/22/2022   LDLCALC 66 08/01/2021   LDLCALC 76 02/20/2021   Lab Results  Component Value Date   TRIG 69.0 08/22/2022   TRIG 85.0 08/01/2021   TRIG 130.0 02/20/2021   Lab Results  Component Value Date   CHOLHDL 2 08/22/2022   CHOLHDL 2 08/01/2021   CHOLHDL 3 02/20/2021  No results found for: "LDLDIRECT" The 10-year ASCVD risk score (Arnett DK, et al., 2019) is: 27.3%   Values used to calculate the score:     Age: 12 years     Sex: Female     Is Non-Hispanic African American: No     Diabetic: Yes     Tobacco smoker: No     Systolic Blood Pressure: 132 mmHg     Is BP treated: Yes     HDL Cholesterol: 77.9 mg/dL     Total Cholesterol: 161 mg/dL I have reviewed the PMH, Fam and Soc history. 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 tscore of -1.3. FRAX: 1.2%/9.3%. repeat 3 years. Continue calcium/vitamin D     GERD (gastroesophageal reflux disease) 10/27/2018    Priority: Medium    Arthritis 10/27/2018    Priority: Medium    B12 deficiency 11/20/2018    Priority: Low   Allergic rhinitis 10/27/2018    Priority: Low   Chronic sinusitis 10/31/2022    Nasal Congestion Runny nose, possibly RSV?     Cough Symptoms present since last Tuesday, had a low grade fever (only that night), Took tylenol, Mucinex and Benadryl, Sudafed with some relief.     Fatigue Weakness.    - total 6 days - never been sick like this - had fever 1 night last week - denies myalgias, but lots of fatigue, and 1 night fever - still able to walk a mile this am without dyspnea - taking tylenol and benadryl mucinex and sudafed, they help some - lots of throat and nose discharge,some a little bloody, but no pus and no sore throat - most bothersome symptoms are cough productive  - denies any pus production        Social History: Patient  reports that she has never smoked. She has never used smokeless tobacco. She reports that she does not drink alcohol and does not use drugs.  Review of Systems: Ophthalmic: negative for eye pain, loss of vision or double vision Cardiovascular: negative for chest pain Respiratory: negative for SOB or persistent cough Gastrointestinal: negative for abdominal pain Genitourinary: negative for dysuria or gross hematuria MSK: negative for foot  lesions Neurologic: negative for weakness or gait disturbance  Objective  Vitals: BP 132/74   Pulse 71   Temp 97.7 F (36.5 C)   Ht 5\' 1"  (1.549 m)   Wt 113 lb 9.6 oz (51.5 kg)   LMP  (LMP Unknown)   SpO2 99%   BMI 21.46 kg/m  General: well appearing, no acute distress  Psych:  Alert and oriented, normal mood and affect HEENT:  Normocephalic, atraumatic, moist mucous membranes, supple neck  Cardiovascular:  Nl S1 and S2, RRR without murmur, gallop or rub. no edema Respiratory:  Good breath sounds bilaterally, CTAB with  normal effort, no rales     Diabetic education: ongoing education regarding chronic disease management for diabetes was given today. We continue to reinforce the ABC's of diabetic management: A1c (<7 or 8 dependent upon patient), tight blood pressure control, and cholesterol management with goal LDL < 100 minimally. We discuss diet strategies, exercise recommendations, medication options and possible side effects. At each visit, we review recommended immunizations and preventive care recommendations for diabetics and stress that good diabetic control can prevent other problems. See below for this patient's data.   Commons side effects, risks, benefits, and alternatives for medications and treatment plan prescribed today were discussed, and the patient expressed understanding of the given instructions. Patient is instructed to call or message via MyChart if he/she has any questions or concerns regarding our treatment plan. No barriers to understanding were identified. We discussed Red Flag symptoms and signs in detail. Patient expressed understanding regarding what to do in case of urgent or emergency type symptoms.  Medication list was reconciled, printed and provided to the patient in AVS. Patient instructions and summary information was reviewed with the patient as documented in the AVS. This note was prepared with assistance of Dragon voice recognition software.  Occasional wrong-word or sound-a-like substitutions may have occurred due to the inherent limitations of voice recognition software

## 2023-01-03 ENCOUNTER — Telehealth: Payer: Self-pay | Admitting: Family Medicine

## 2023-01-03 NOTE — Telephone Encounter (Signed)
Patient states she went to [place] and forgot to bring her Dulaglutide (TRULICITY) 1.5 MA/2.6JF SOPN .  She left it behind. Patient is asking if she can have samples or a refill to last her until her visit with you on 02/19/23.

## 2023-01-04 ENCOUNTER — Other Ambulatory Visit: Payer: Self-pay | Admitting: Family Medicine

## 2023-01-04 NOTE — Telephone Encounter (Signed)
I called and advised pt of orders, pt states she will call pharmacy and request early refill, and if they need verbal orders, she will give [place] a call

## 2023-01-04 NOTE — Telephone Encounter (Signed)
Patient called wanting office to respond back to pharmacy. Patient states she is at the pharmacy now awaiting refill. She has no more medication to last till tomorrow.

## 2023-01-23 ENCOUNTER — Other Ambulatory Visit: Payer: Self-pay

## 2023-01-23 DIAGNOSIS — N1832 Chronic kidney disease, stage 3b: Secondary | ICD-10-CM

## 2023-01-23 MED ORDER — TRULICITY 1.5 MG/0.5ML ~~LOC~~ SOAJ: 1.5000 mg | SUBCUTANEOUS | 3 refills | Status: DC

## 2023-02-02 ENCOUNTER — Inpatient Hospital Stay (HOSPITAL_COMMUNITY): Payer: BC Managed Care – PPO

## 2023-02-02 ENCOUNTER — Other Ambulatory Visit: Payer: Self-pay

## 2023-02-02 ENCOUNTER — Inpatient Hospital Stay (HOSPITAL_COMMUNITY)
Admission: EM | Disposition: A | Discharge status: Home / Self Care | Payer: Self-pay | Source: Home / Self Care | Attending: Cardiovascular Disease

## 2023-02-02 ENCOUNTER — Inpatient Hospital Stay (HOSPITAL_COMMUNITY)
Admission: EM | Admit: 2023-02-02 | Discharge: 2023-02-05 | DRG: 322 | Disposition: A | Discharge status: Home / Self Care | Payer: BC Managed Care – PPO | Attending: Cardiovascular Disease | Admitting: Cardiovascular Disease

## 2023-02-02 ENCOUNTER — Other Ambulatory Visit (HOSPITAL_COMMUNITY): Payer: BC Managed Care – PPO

## 2023-02-02 DIAGNOSIS — Z794 Long term (current) use of insulin: Secondary | ICD-10-CM

## 2023-02-02 DIAGNOSIS — Z96651 Presence of right artificial knee joint: Secondary | ICD-10-CM | POA: Diagnosis present

## 2023-02-02 DIAGNOSIS — I2510 Atherosclerotic heart disease of native coronary artery without angina pectoris: Secondary | ICD-10-CM

## 2023-02-02 DIAGNOSIS — Z79899 Other long term (current) drug therapy: Secondary | ICD-10-CM

## 2023-02-02 DIAGNOSIS — I152 Hypertension secondary to endocrine disorders: Secondary | ICD-10-CM | POA: Diagnosis present

## 2023-02-02 DIAGNOSIS — R1013 Epigastric pain: Secondary | ICD-10-CM | POA: Diagnosis not present

## 2023-02-02 DIAGNOSIS — Z955 Presence of coronary angioplasty implant and graft: Secondary | ICD-10-CM

## 2023-02-02 DIAGNOSIS — E785 Hyperlipidemia, unspecified: Secondary | ICD-10-CM | POA: Diagnosis present

## 2023-02-02 DIAGNOSIS — I2119 ST elevation (STEMI) myocardial infarction involving other coronary artery of inferior wall: Secondary | ICD-10-CM | POA: Diagnosis not present

## 2023-02-02 DIAGNOSIS — Z888 Allergy status to other drugs, medicaments and biological substances status: Secondary | ICD-10-CM

## 2023-02-02 DIAGNOSIS — I2121 ST elevation (STEMI) myocardial infarction involving left circumflex coronary artery: Principal | ICD-10-CM

## 2023-02-02 DIAGNOSIS — Z7984 Long term (current) use of oral hypoglycemic drugs: Secondary | ICD-10-CM | POA: Diagnosis not present

## 2023-02-02 DIAGNOSIS — E1169 Type 2 diabetes mellitus with other specified complication: Secondary | ICD-10-CM | POA: Diagnosis present

## 2023-02-02 DIAGNOSIS — E1122 Type 2 diabetes mellitus with diabetic chronic kidney disease: Secondary | ICD-10-CM | POA: Diagnosis present

## 2023-02-02 DIAGNOSIS — I2111 ST elevation (STEMI) myocardial infarction involving right coronary artery: Principal | ICD-10-CM

## 2023-02-02 DIAGNOSIS — E1159 Type 2 diabetes mellitus with other circulatory complications: Secondary | ICD-10-CM | POA: Diagnosis not present

## 2023-02-02 DIAGNOSIS — N189 Chronic kidney disease, unspecified: Secondary | ICD-10-CM | POA: Diagnosis present

## 2023-02-02 DIAGNOSIS — I213 ST elevation (STEMI) myocardial infarction of unspecified site: Secondary | ICD-10-CM

## 2023-02-02 DIAGNOSIS — Z8249 Family history of ischemic heart disease and other diseases of the circulatory system: Secondary | ICD-10-CM | POA: Diagnosis not present

## 2023-02-02 DIAGNOSIS — E7841 Elevated Lipoprotein(a): Secondary | ICD-10-CM | POA: Diagnosis present

## 2023-02-02 DIAGNOSIS — Z8261 Family history of arthritis: Secondary | ICD-10-CM

## 2023-02-02 HISTORY — PX: CORONARY/GRAFT ACUTE MI REVASCULARIZATION: CATH118305

## 2023-02-02 HISTORY — PX: LEFT HEART CATH AND CORONARY ANGIOGRAPHY: CATH118249

## 2023-02-02 LAB — CBC WITH DIFFERENTIAL/PLATELET
Abs Immature Granulocytes: 0.02 10*3/uL (ref 0.00–0.07)
Basophils Absolute: 0 10*3/uL (ref 0.0–0.1)
Basophils Relative: 1 %
Eosinophils Absolute: 0.3 10*3/uL (ref 0.0–0.5)
Eosinophils Relative: 3 %
HCT: 42.7 % (ref 36.0–46.0)
Hemoglobin: 14.3 g/dL (ref 12.0–15.0)
Immature Granulocytes: 0 %
Lymphocytes Relative: 17 %
Lymphs Abs: 1.4 10*3/uL (ref 0.7–4.0)
MCH: 30.7 pg (ref 26.0–34.0)
MCHC: 33.5 g/dL (ref 30.0–36.0)
MCV: 91.6 fL (ref 80.0–100.0)
Monocytes Absolute: 0.4 10*3/uL (ref 0.1–1.0)
Monocytes Relative: 5 %
Neutro Abs: 6.1 10*3/uL (ref 1.7–7.7)
Neutrophils Relative %: 74 %
Platelets: 239 10*3/uL (ref 150–400)
RBC: 4.66 MIL/uL (ref 3.87–5.11)
RDW: 14.3 % (ref 11.5–15.5)
WBC: 8.2 10*3/uL (ref 4.0–10.5)
nRBC: 0 % (ref 0.0–0.2)

## 2023-02-02 LAB — COMPREHENSIVE METABOLIC PANEL
ALT: 49 U/L — ABNORMAL HIGH (ref 0–44)
AST: 373 U/L — ABNORMAL HIGH (ref 15–41)
Albumin: 3.6 g/dL (ref 3.5–5.0)
Alkaline Phosphatase: 64 U/L (ref 38–126)
Anion gap: 8 (ref 5–15)
BUN: 21 mg/dL (ref 8–23)
CO2: 23 mmol/L (ref 22–32)
Calcium: 9.2 mg/dL (ref 8.9–10.3)
Chloride: 104 mmol/L (ref 98–111)
Creatinine, Ser: 1.26 mg/dL — ABNORMAL HIGH (ref 0.44–1.00)
GFR, Estimated: 45 mL/min — ABNORMAL LOW (ref >60)
Glucose, Bld: 161 mg/dL — ABNORMAL HIGH (ref 70–99)
Potassium: 4.4 mmol/L (ref 3.5–5.1)
Sodium: 135 mmol/L (ref 135–145)
Total Bilirubin: 0.9 mg/dL (ref 0.3–1.2)
Total Protein: 7.2 g/dL (ref 6.5–8.1)

## 2023-02-02 LAB — ECHOCARDIOGRAM COMPLETE
Area-P 1/2: 4.15 cm2
Calc EF: 47.5 %
MV M vel: 5.14 m/s
MV Peak grad: 105.7 mmHg
S' Lateral: 2.9 cm
Single Plane A2C EF: 47.6 %
Single Plane A4C EF: 43.7 %
Weight: 1824 oz

## 2023-02-02 LAB — HEMOGLOBIN A1C
Hgb A1c MFr Bld: 6.9 % — ABNORMAL HIGH (ref 4.8–5.6)
Mean Plasma Glucose: 151.33 mg/dL

## 2023-02-02 LAB — GLUCOSE, CAPILLARY
Glucose-Capillary: 129 mg/dL — ABNORMAL HIGH (ref 70–99)
Glucose-Capillary: 131 mg/dL — ABNORMAL HIGH (ref 70–99)
Glucose-Capillary: 173 mg/dL — ABNORMAL HIGH (ref 70–99)
Glucose-Capillary: 193 mg/dL — ABNORMAL HIGH (ref 70–99)

## 2023-02-02 LAB — POCT ACTIVATED CLOTTING TIME
Activated Clotting Time: 163 seconds
Activated Clotting Time: 152 seconds
Activated Clotting Time: 152 seconds
Activated Clotting Time: 163 seconds

## 2023-02-02 LAB — I-STAT CHEM 8, ED
BUN: 28 mg/dL — ABNORMAL HIGH (ref 8–23)
Calcium, Ion: 1.08 mmol/L — ABNORMAL LOW (ref 1.15–1.40)
Chloride: 106 mmol/L (ref 98–111)
Creatinine, Ser: 1.2 mg/dL — ABNORMAL HIGH (ref 0.44–1.00)
Glucose, Bld: 160 mg/dL — ABNORMAL HIGH (ref 70–99)
HCT: 44 % (ref 36.0–46.0)
Hemoglobin: 15 g/dL (ref 12.0–15.0)
Potassium: 3.6 mmol/L (ref 3.5–5.1)
Sodium: 139 mmol/L (ref 135–145)
TCO2: 22 mmol/L (ref 22–32)

## 2023-02-02 LAB — APTT: aPTT: 32 seconds (ref 24–36)

## 2023-02-02 LAB — TSH: TSH: 4.991 u[IU]/mL — ABNORMAL HIGH (ref 0.350–4.500)

## 2023-02-02 LAB — BRAIN NATRIURETIC PEPTIDE: B Natriuretic Peptide: 78.8 pg/mL (ref 0.0–100.0)

## 2023-02-02 LAB — T4, FREE: Free T4: 1.59 ng/dL — ABNORMAL HIGH (ref 0.61–1.12)

## 2023-02-02 LAB — TROPONIN I (HIGH SENSITIVITY): Troponin I (High Sensitivity): >24000 ng/L (ref <18)

## 2023-02-02 LAB — MRSA NEXT GEN BY PCR, NASAL: MRSA by PCR Next Gen: NOT DETECTED

## 2023-02-02 SURGERY — CORONARY/GRAFT ACUTE MI REVASCULARIZATION
Anesthesia: LOCAL

## 2023-02-02 MED ORDER — SODIUM CHLORIDE 0.9% FLUSH: 3.0000 mL | INTRAVENOUS | Status: DC | PRN

## 2023-02-02 MED ORDER — ROSUVASTATIN CALCIUM 20 MG PO TABS
40.0000 mg | ORAL_TABLET | Freq: Every day | ORAL | Status: DC
  Administered 2023-02-02 – 2023-02-05 (×4): 40 mg via ORAL
  Filled 2023-02-02 (×4): qty 2

## 2023-02-02 MED ORDER — IOHEXOL 350 MG/ML SOLN
INTRAVENOUS | Status: DC | PRN
  Administered 2023-02-02: 150 mL via INTRA_ARTERIAL

## 2023-02-02 MED ORDER — TICAGRELOR 90 MG PO TABS
90.0000 mg | ORAL_TABLET | Freq: Two times a day (BID) | ORAL | Status: DC
  Administered 2023-02-02 – 2023-02-05 (×7): 90 mg via ORAL
  Filled 2023-02-02 (×7): qty 1

## 2023-02-02 MED ORDER — LOSARTAN POTASSIUM 50 MG PO TABS: 50.0000 mg | ORAL_TABLET | Freq: Every day | ORAL | Status: DC

## 2023-02-02 MED ORDER — LIDOCAINE HCL (PF) 1 % IJ SOLN
INTRAMUSCULAR | Status: AC
  Filled 2023-02-02: qty 30

## 2023-02-02 MED ORDER — HEPARIN SODIUM (PORCINE) 1000 UNIT/ML IJ SOLN: 4000.0000 [IU] | Freq: Once | INTRAMUSCULAR | Status: DC

## 2023-02-02 MED ORDER — ENOXAPARIN SODIUM 40 MG/0.4ML IJ SOSY: 40.0000 mg | PREFILLED_SYRINGE | INTRAMUSCULAR | Status: DC

## 2023-02-02 MED ORDER — HEPARIN (PORCINE) IN NACL 1000-0.9 UT/500ML-% IV SOLN
INTRAVENOUS | Status: DC | PRN
  Administered 2023-02-02 (×3): 500 mL

## 2023-02-02 MED ORDER — ONDANSETRON HCL 4 MG/2ML IJ SOLN
4.0000 mg | Freq: Four times a day (QID) | INTRAMUSCULAR | Status: DC | PRN
  Administered 2023-02-02 – 2023-02-05 (×2): 4 mg via INTRAVENOUS
  Filled 2023-02-02 (×2): qty 2

## 2023-02-02 MED ORDER — ATROPINE SULFATE 1 MG/10ML IJ SOSY
PREFILLED_SYRINGE | INTRAMUSCULAR | Status: AC
  Filled 2023-02-02: qty 10

## 2023-02-02 MED ORDER — ROSUVASTATIN CALCIUM 5 MG PO TABS
10.0000 mg | ORAL_TABLET | Freq: Every day | ORAL | Status: DC
  Administered 2023-02-02: 10 mg via ORAL
  Filled 2023-02-02: qty 2

## 2023-02-02 MED ORDER — INSULIN ASPART 100 UNIT/ML IJ SOLN: 0.0000 [IU] | Freq: Three times a day (TID) | INTRAMUSCULAR | Status: DC

## 2023-02-02 MED ORDER — HEPARIN SODIUM (PORCINE) 5000 UNIT/ML IJ SOLN
INTRAMUSCULAR | Status: AC
  Administered 2023-02-02: 4000 [IU]
  Filled 2023-02-02: qty 1

## 2023-02-02 MED ORDER — ASPIRIN 81 MG PO TBEC
81.0000 mg | DELAYED_RELEASE_TABLET | Freq: Every day | ORAL | Status: DC
  Administered 2023-02-03 – 2023-02-05 (×3): 81 mg via ORAL
  Filled 2023-02-02 (×3): qty 1

## 2023-02-02 MED ORDER — TICAGRELOR 90 MG PO TABS
ORAL_TABLET | ORAL | Status: AC
  Filled 2023-02-02: qty 2

## 2023-02-02 MED ORDER — ASPIRIN 81 MG PO CHEW: 81.0000 mg | CHEWABLE_TABLET | Freq: Every day | ORAL | Status: DC

## 2023-02-02 MED ORDER — METOPROLOL TARTRATE 12.5 MG HALF TABLET
12.5000 mg | ORAL_TABLET | Freq: Two times a day (BID) | ORAL | Status: DC
  Administered 2023-02-02 – 2023-02-04 (×6): 12.5 mg via ORAL
  Filled 2023-02-02 (×6): qty 1

## 2023-02-02 MED ORDER — PANTOPRAZOLE SODIUM 40 MG PO TBEC
40.0000 mg | DELAYED_RELEASE_TABLET | Freq: Every day | ORAL | Status: DC
  Administered 2023-02-02 – 2023-02-05 (×4): 40 mg via ORAL
  Filled 2023-02-02 (×6): qty 1

## 2023-02-02 MED ORDER — LOSARTAN POTASSIUM 50 MG PO TABS
100.0000 mg | ORAL_TABLET | Freq: Every day | ORAL | Status: DC
  Administered 2023-02-02 – 2023-02-05 (×3): 100 mg via ORAL
  Filled 2023-02-02 (×3): qty 2

## 2023-02-02 MED ORDER — NITROGLYCERIN 1 MG/10 ML FOR IR/CATH LAB
INTRA_ARTERIAL | Status: AC
  Filled 2023-02-02: qty 10

## 2023-02-02 MED ORDER — TIROFIBAN HCL IN NACL 5-0.9 MG/100ML-% IV SOLN
INTRAVENOUS | Status: AC | PRN
  Administered 2023-02-02: .15 ug/kg/min via INTRAVENOUS

## 2023-02-02 MED ORDER — DAPAGLIFLOZIN PROPANEDIOL 10 MG PO TABS
10.0000 mg | ORAL_TABLET | Freq: Every day | ORAL | Status: DC
  Administered 2023-02-03 – 2023-02-05 (×3): 10 mg via ORAL
  Filled 2023-02-02 (×4): qty 1

## 2023-02-02 MED ORDER — SODIUM CHLORIDE 0.9% FLUSH
3.0000 mL | Freq: Two times a day (BID) | INTRAVENOUS | Status: DC
  Administered 2023-02-02 – 2023-02-05 (×5): 3 mL via INTRAVENOUS

## 2023-02-02 MED ORDER — TIROFIBAN HCL IN NACL 5-0.9 MG/100ML-% IV SOLN: 0.0750 ug/kg/min | INTRAVENOUS | Status: AC

## 2023-02-02 MED ORDER — LABETALOL HCL 5 MG/ML IV SOLN
10.0000 mg | INTRAVENOUS | Status: AC | PRN
  Administered 2023-02-02: 10 mg via INTRAVENOUS
  Filled 2023-02-02: qty 4

## 2023-02-02 MED ORDER — NITROGLYCERIN 0.4 MG SL SUBL: 0.4000 mg | SUBLINGUAL_TABLET | SUBLINGUAL | Status: DC | PRN

## 2023-02-02 MED ORDER — ONDANSETRON HCL 4 MG/2ML IJ SOLN: 4.0000 mg | Freq: Four times a day (QID) | INTRAMUSCULAR | Status: DC | PRN

## 2023-02-02 MED ORDER — HEPARIN SODIUM (PORCINE) 5000 UNIT/ML IJ SOLN: 4000.0000 [IU] | Freq: Once | INTRAMUSCULAR | Status: DC

## 2023-02-02 MED ORDER — TICAGRELOR 90 MG PO TABS: 90.0000 mg | ORAL_TABLET | Freq: Two times a day (BID) | ORAL | Status: DC

## 2023-02-02 MED ORDER — AMLODIPINE BESYLATE 5 MG PO TABS: 5.0000 mg | ORAL_TABLET | Freq: Every day | ORAL | Status: DC

## 2023-02-02 MED ORDER — SODIUM CHLORIDE 0.9 % IV SOLN: INTRAVENOUS | Status: AC

## 2023-02-02 MED ORDER — TICAGRELOR 90 MG PO TABS
ORAL_TABLET | ORAL | Status: DC | PRN
  Administered 2023-02-02: 180 mg via ORAL

## 2023-02-02 MED ORDER — MORPHINE SULFATE (PF) 2 MG/ML IV SOLN
2.0000 mg | INTRAVENOUS | Status: DC | PRN
  Administered 2023-02-02 (×2): 2 mg via INTRAVENOUS
  Filled 2023-02-02 (×2): qty 1

## 2023-02-02 MED ORDER — HYDRALAZINE HCL 20 MG/ML IJ SOLN: 10.0000 mg | INTRAMUSCULAR | Status: AC | PRN

## 2023-02-02 MED ORDER — TIROFIBAN (AGGRASTAT) BOLUS VIA INFUSION
INTRAVENOUS | Status: DC | PRN
  Administered 2023-02-02: 1287.5 ug via INTRAVENOUS

## 2023-02-02 MED ORDER — LIDOCAINE HCL (PF) 1 % IJ SOLN
INTRAMUSCULAR | Status: DC | PRN
  Administered 2023-02-02: 12 mL

## 2023-02-02 MED ORDER — HEPARIN SODIUM (PORCINE) 1000 UNIT/ML IJ SOLN
INTRAMUSCULAR | Status: DC | PRN
  Administered 2023-02-02: 2000 [IU] via INTRAVENOUS

## 2023-02-02 MED ORDER — SODIUM CHLORIDE 0.9 % IV SOLN
INTRAVENOUS | Status: AC | PRN
  Administered 2023-02-02: 10 mL/h via INTRAVENOUS

## 2023-02-02 MED ORDER — ACETAMINOPHEN 325 MG PO TABS: 650.0000 mg | ORAL_TABLET | ORAL | Status: DC | PRN

## 2023-02-02 MED ORDER — HEPARIN SODIUM (PORCINE) 1000 UNIT/ML IJ SOLN
INTRAMUSCULAR | Status: AC
  Filled 2023-02-02: qty 10

## 2023-02-02 MED ORDER — TIROFIBAN HCL IN NACL 5-0.9 MG/100ML-% IV SOLN
INTRAVENOUS | Status: AC
  Filled 2023-02-02: qty 100

## 2023-02-02 MED ORDER — ACETAMINOPHEN 325 MG PO TABS
650.0000 mg | ORAL_TABLET | ORAL | Status: DC | PRN
  Administered 2023-02-02 – 2023-02-04 (×3): 650 mg via ORAL
  Filled 2023-02-02 (×3): qty 2

## 2023-02-02 MED ORDER — AMLODIPINE BESYLATE 5 MG PO TABS
5.0000 mg | ORAL_TABLET | Freq: Every day | ORAL | Status: DC
  Administered 2023-02-02: 5 mg via ORAL
  Filled 2023-02-02: qty 1

## 2023-02-02 MED ORDER — CHLORHEXIDINE GLUCONATE CLOTH 2 % EX PADS
6.0000 | MEDICATED_PAD | Freq: Every day | CUTANEOUS | Status: DC
  Administered 2023-02-02 – 2023-02-04 (×3): 6 via TOPICAL

## 2023-02-02 MED ORDER — METOPROLOL TARTRATE 5 MG/5ML IV SOLN: 5.0000 mg | Freq: Once | INTRAVENOUS | Status: AC

## 2023-02-02 MED ORDER — ORAL CARE MOUTH RINSE: 15.0000 mL | OROMUCOSAL | Status: DC | PRN

## 2023-02-02 MED ORDER — SODIUM CHLORIDE 0.9 % IV SOLN: 250.0000 mL | INTRAVENOUS | Status: DC | PRN

## 2023-02-02 MED ORDER — VERAPAMIL HCL 2.5 MG/ML IV SOLN
INTRAVENOUS | Status: AC
  Filled 2023-02-02: qty 2

## 2023-02-02 MED ORDER — METOPROLOL TARTRATE 5 MG/5ML IV SOLN
INTRAVENOUS | Status: AC
  Administered 2023-02-02: 5 mg via INTRAVENOUS
  Filled 2023-02-02: qty 5

## 2023-02-02 SURGICAL SUPPLY — 20 items
BALLN EMERGE MR 2.0X12 (BALLOONS) ×1
BALLN ~~LOC~~ EMERGE MR 3.25X12 (BALLOONS) ×1
BALLOON EMERGE MR 2.0X12 (BALLOONS) IMPLANT
BALLOON ~~LOC~~ EMERGE MR 3.25X12 (BALLOONS) IMPLANT
CATH EXTRAC PRONTO 5.5F 138CM (CATHETERS) IMPLANT
CATH INFINITI 6F ANG MULTIPACK (CATHETERS) IMPLANT
CATH VISTA GUIDE 6FR XB3 (CATHETERS) IMPLANT
GLIDESHEATH SLEND A-KIT 6F 22G (SHEATH) IMPLANT
KIT ENCORE 26 ADVANTAGE (KITS) IMPLANT
KIT HEART LEFT (KITS) ×1 IMPLANT
PACK CARDIAC CATHETERIZATION (CUSTOM PROCEDURE TRAY) ×1 IMPLANT
SHEATH PINNACLE 6F 10CM (SHEATH) IMPLANT
SHEATH PROBE COVER 6X72 (BAG) IMPLANT
STENT SYNERGY XD 3.0X16 (Permanent Stent) IMPLANT
SYNERGY XD 3.0X16 (Permanent Stent) ×1 IMPLANT
TRANSDUCER W/STOPCOCK (MISCELLANEOUS) ×1 IMPLANT
TUBING CIL FLEX 10 FLL-RA (TUBING) ×1 IMPLANT
WIRE ASAHI PROWATER 180CM (WIRE) IMPLANT
WIRE EMERALD 3MM-J .035X150CM (WIRE) IMPLANT
WIRE HI TORQ VERSACORE-J 145CM (WIRE) IMPLANT

## 2023-02-02 NOTE — Progress Notes (Signed)
  Echocardiogram 2D Echocardiogram has been performed.  Wanda Medina 02/02/2023, 10:51 AM

## 2023-02-02 NOTE — ED Provider Notes (Signed)
[facility] CATH LAB Provider Note   CSN: [id number] Arrival date & time: 02/02/23  0348     History  Chief Complaint  Patient presents with   Chest Pain    Wanda Medina is a 72 y.o. female.  72 year old female who presents via EMS secondary to STEMI.  Patient started on chest pain on Thursday.  Intermittent nature and retrosternal.  No real associated symptoms and relatively brief.  She had multiple episodes again on Friday but then around 9:00 Friday evening is started and became much more persistent and never really went away.  At this time it became associated with nausea, lightheadedness and dyspnea.  This is what prompted her to call EMS.  With EMS and EKG did show what look like STEMI in the inferior leads with reciprocal changes.  Multiple serial EKGs were done that showed evolving changes.  No nitro secondary to the location however full dose aspirin was given prior to arrival.  Cardiology and myself at bedside on arrival.   Chest Pain      Home Medications Prior to Admission medications   Medication Sig Start Date End Date Taking? Authorizing Provider  amLODipine (NORVASC) 5 MG tablet TAKE 1 TABLET (5 MG TOTAL) BY MOUTH DAILY. 07/02/22   Leamon Arnt, MD  calcium-vitamin D (OSCAL WITH D) 500-200 MG-UNIT tablet Take 1 tablet by mouth.    [provider]  cyclobenzaprine (FLEXERIL) 5 MG tablet Take 1 tablet (5 mg total) by mouth 2 (two) times daily as needed for muscle spasms. 09/17/22   Leamon Arnt, MD  dapagliflozin propanediol (FARXIGA) 10 MG TABS tablet Take 1 tablet (10 mg total) by mouth daily before breakfast. 08/15/22   Leamon Arnt, MD  Dulaglutide (TRULICITY) 1.5 0000000 SOPN Inject 1.5 mg into the skin once a week. 01/23/23   Leamon Arnt, MD  ferrous sulfate 324 (65 Fe) MG TBEC Take by mouth.    [provider]  fluticasone (FLONASE) 50 MCG/ACT nasal spray Place 2 sprays into both nostrils  daily. 10/31/22   Loralee Pacas, MD  gabapentin (NEURONTIN) 100 MG capsule Take 1 capsule (100 mg total) by mouth 2 (two) times daily. 08/15/22   Leamon Arnt, MD  glucose blood (ONETOUCH ULTRA) test strip TEST BLOOD SUGARS TWICE DAILY AS NEEDED. 06/25/22   Leamon Arnt, MD  Lancets (BD LANCET ULTRAFINE 99991111) MISC 1 application by Does not apply route in the morning and at bedtime. 10/31/21   Leamon Arnt, MD  loratadine (CLARITIN) 10 MG tablet Take 1 tablet (10 mg total) by mouth daily. 10/31/22   Loralee Pacas, MD  losartan (COZAAR) 100 MG tablet TAKE 1 TABLET BY MOUTH EVERY DAY 02/21/22   Leamon Arnt, MD  montelukast (SINGULAIR) 10 MG tablet TAKE 1 TABLET BY MOUTH EVERYDAY AT BEDTIME 08/06/22   Leamon Arnt, MD  omeprazole (PRILOSEC) 20 MG capsule Take 1 capsule (20 mg total) by mouth daily. 08/15/22   Leamon Arnt, MD  pseudoephedrine (SUDAFED 12 HOUR) 120 MG 12 hr tablet Take 1 tablet (120 mg total) by mouth 2 (two) times daily. 10/31/22   Loralee Pacas, MD  rosuvastatin (CRESTOR) 10 MG tablet TAKE 1 TABLET BY MOUTH EVERY DAY 09/07/22   Leamon Arnt, MD  Saline (SIMPLY SALINE) 0.9 % AERS Place 2 each into the nose daily as needed. 10/31/22   Loralee Pacas, MD  zinc gluconate 50 MG tablet Take 50  mg by mouth daily.    [provider]      Allergies    Ace inhibitors, Altace [ramipril], and Lisinopril    Review of Systems   Review of Systems  Cardiovascular:  Positive for chest pain.    Physical Exam Updated Vital Signs BP (!) 144/92   Pulse 87   Resp 19   LMP  (LMP Unknown)   SpO2 99%  Physical Exam Vitals and nursing note reviewed.  Constitutional:      Appearance: She is well-developed.  HENT:     Head: Normocephalic and atraumatic.  Cardiovascular:     Rate and Rhythm: Normal rate and regular rhythm.     Heart sounds: No murmur heard.    Comments: Strong radial and DP pulses, no JVD Pulmonary:     Effort: No respiratory distress.      Breath sounds: No stridor.  Chest:     Chest wall: No mass.  Abdominal:     General: There is no distension.     Palpations: Abdomen is soft.  Musculoskeletal:        General: Normal range of motion.     Cervical back: Normal range of motion.     Right lower leg: No edema.     Left lower leg: No edema.  Skin:    General: Skin is warm and dry.  Neurological:     Mental Status: She is alert.     ED Results / Procedures / Treatments   Labs (all labs ordered are listed, but only abnormal results are displayed) Labs Reviewed  I-STAT CHEM 8, ED - Abnormal; Notable for the following components:      Result Value   BUN 28 (*)    Creatinine, Ser 1.20 (*)    Glucose, Bld 160 (*)    Calcium, Ion 1.08 (*)    All other components within normal limits  LIPOPROTEIN A (LPA)  CBC  COMPREHENSIVE METABOLIC PANEL  TSH  T4, FREE  HEMOGLOBIN A1C  BRAIN NATRIURETIC PEPTIDE  CBC WITH DIFFERENTIAL/PLATELET    EKG EKG Interpretation  Date/Time:  Saturday February 02 2023 04:00:20 EST Ventricular Rate:  86 PR Interval:  168 QRS Duration: 91 QT Interval:  354 QTC Calculation: 424 R Axis:   23 Text Interpretation: Sinus rhythm Probable left atrial enlargement Inferior infarct, acute (RCA) Anterior infarct, old Lateral leads are also involved Probable RV involvement, suggest recording right precordial leads >>> Acute MI <<< Confirmed by Merrily Pew [phone]) on 02/02/2023 4:15:14 AM  Radiology No results found.  Procedures .Critical Care  Performed by: Merrily Pew, MD Authorized by: Merrily Pew, MD   Critical care provider statement:    Critical care time (minutes):  30   Critical care was necessary to treat or prevent imminent or life-threatening deterioration of the following conditions:  Cardiac failure   Critical care was time spent personally by me on the following activities:  Development of treatment plan with patient or surrogate, discussions with consultants, evaluation of  patient's response to treatment, examination of patient, ordering and review of laboratory studies, ordering and review of radiographic studies, ordering and performing treatments and interventions, pulse oximetry, re-evaluation of patient's condition and review of old charts     Medications Ordered in ED Medications  heparin injection 4,000 Units ( Intravenous Automatically Held 02/02/23 0430)  Heparin (Porcine) in NaCl 1000-0.9 UT/500ML-% SOLN (500 mLs  Given 02/02/23 0426)  lidocaine (PF) (XYLOCAINE) 1 % injection (12 mLs Infiltration Given 02/02/23 0433)  0.9 %  sodium chloride infusion (10 mL/hr Intravenous New Bag/Given 02/02/23 0435)  heparin sodium (porcine) injection (2,000 Units Intravenous Given 02/02/23 0438)  ticagrelor (BRILINTA) tablet (180 mg Oral Given 02/02/23 0443)  tirofiban (AGGRASTAT) bolus via infusion (1,287.5 mcg Intravenous Given 02/02/23 0453)  tirofiban (AGGRASTAT) infusion 50 mcg/mL 100 mL (0.15 mcg/kg/min  51.5 kg (Order-Specific) Intravenous New Bag/Given 02/02/23 0454)  aspirin EC tablet 81 mg (has no administration in time range)  nitroGLYCERIN (NITROSTAT) SL tablet 0.4 mg (has no administration in time range)  acetaminophen (TYLENOL) tablet 650 mg (has no administration in time range)  ondansetron (ZOFRAN) injection 4 mg (has no administration in time range)  enoxaparin (LOVENOX) injection 40 mg (has no administration in time range)  ticagrelor (BRILINTA) tablet 90 mg (has no administration in time range)  metoprolol tartrate (LOPRESSOR) tablet 12.5 mg (has no administration in time range)  rosuvastatin (CRESTOR) tablet 40 mg (has no administration in time range)  losartan (COZAAR) tablet 50 mg (has no administration in time range)  heparin 5000 UNIT/ML injection (4,000 Units  Given 02/02/23 0414)    ED Course/ Medical Decision Making/ A&P                             Medical Decision Making Risk Prescription drug management.   Patient with obvious STEMI on  arrival here.  Aspirin already been given.  Nitroglycerin relatively contraindicated and chest pain-free still held.  Heparin given.  Placed on monitor.  Low suspicion for dissection, aneurysm or other etiologies for her discomfort.  Discussed with cardiology and they will take to the Cath Lab for catheterization of likely inferior/RCA STEMI.   Final Clinical Impression(s) / ED Diagnoses Final diagnoses:  ST elevation myocardial infarction involving right coronary artery [facility])    Rx / DC Orders ED Discharge Orders     None         Tynika Luddy, Corene Cornea, MD 02/02/23 [phone]

## 2023-02-02 NOTE — Progress Notes (Signed)
Cardiac rehab to see patient but she is currently having chest pain. Pt not appropriate at this time for our service. Will follow.   Lesly Rubenstein MS, ACSM-CEP, CCRP  12:30-12:40

## 2023-02-02 NOTE — Progress Notes (Signed)
Cardiology  Called to see patient for more chest pain. ECG shows inferiro Q waves and residual ST elevation but not appreciably worse. I have ordered 5 mg of IV metoprolol and 2 mg of IV MSO4 and a troponin. We will reassess after an hour. BP - 144/90 and HR 82.  Wanda Overlie Adonai Selsor,MD

## 2023-02-02 NOTE — H&P (Signed)
Cardiology Admission History and Physical   Patient ID: Wanda Medina MRN: [id number]; DOB: Feb 01, 1951   Admission date: 02/02/2023  PCP:  Leamon Arnt, MD   [facility] Providers Cardiologist:  None        Chief Complaint:  chest pain  Patient Profile:   Wanda Medina is a 72 y.o. female with PMHx of diabetes, hypertension, CKD (baseline Cr 1.25-1.34) and hyperlipidemia who is being seen 02/02/2023 for the evaluation of chest pain.  History of Present Illness:   [name] presented to the ER complaining of chest pain. Onset was Thursday morning and it was described as retrosternal pressure like sensation radiating to the neck that lasted approximately 5 min and relieved spontaneously. She had 3 additional episodes throughout the day, all lasting < 10 minutes. No particular triggers or relieves. On Friday, she had 5 intermittent episodes of similar characteristics and around 9 pm the pain became persistent and associated with diaphoresis and nausea. She took tylenol in attempt to relieve it but after not seeing improvement, she told her daughter at 2 am to call 911.  ECG in the field (3:22 am) showed inferolateral STEMI and she was brought to [facility] for further evaluation. She was loaded with ASA and heparin and brought to the cardiac catheterization lab.  She is originally from [place]. Has strong Fhx of CAD (both parents died in their 61's with myocardial infarction). She has a brother who in his 78s had an MI. Does not smoke, no alcohol or drugs. He husband is a Teacher, music and her son is a Ambulance person in [place].  NKDA.  No prior bleeding disorders.   Past Medical History:  Diagnosis Date   Allergy    Arthritis    Chronic kidney disease    AKI saw Nephrologist in [place]   Diabetes mellitus without complication ([place])    GERD (gastroesophageal reflux disease)    Hyperlipemia    Hypertension     Past Surgical History:   Procedure Laterality Date   APPENDECTOMY     COLONOSCOPY     MEDIAL PARTIAL KNEE REPLACEMENT     Rt. knee in 05/2016   TRIGGER FINGER RELEASE     both hands     Medications Prior to Admission: Prior to Admission medications   Medication Sig Start Date End Date Taking? Authorizing Provider  amLODipine (NORVASC) 5 MG tablet TAKE 1 TABLET (5 MG TOTAL) BY MOUTH DAILY. 07/02/22   Leamon Arnt, MD  calcium-vitamin D (OSCAL WITH D) 500-200 MG-UNIT tablet Take 1 tablet by mouth.    [provider]  cyclobenzaprine (FLEXERIL) 5 MG tablet Take 1 tablet (5 mg total) by mouth 2 (two) times daily as needed for muscle spasms. 09/17/22   Leamon Arnt, MD  dapagliflozin propanediol (FARXIGA) 10 MG TABS tablet Take 1 tablet (10 mg total) by mouth daily before breakfast. 08/15/22   Leamon Arnt, MD  Dulaglutide (TRULICITY) 1.5 0000000 SOPN Inject 1.5 mg into the skin once a week. 01/23/23   Leamon Arnt, MD  ferrous sulfate 324 (65 Fe) MG TBEC Take by mouth.    [provider]  fluticasone (FLONASE) 50 MCG/ACT nasal spray Place 2 sprays into both nostrils daily. 10/31/22   Loralee Pacas, MD  gabapentin (NEURONTIN) 100 MG capsule Take 1 capsule (100 mg total) by mouth 2 (two) times daily. 08/15/22   Leamon Arnt, MD  glucose blood (ONETOUCH ULTRA) test strip TEST BLOOD SUGARS  TWICE DAILY AS NEEDED. 06/25/22   Leamon Arnt, MD  Lancets (BD LANCET ULTRAFINE 99991111) MISC 1 application by Does not apply route in the morning and at bedtime. 10/31/21   Leamon Arnt, MD  loratadine (CLARITIN) 10 MG tablet Take 1 tablet (10 mg total) by mouth daily. 10/31/22   Loralee Pacas, MD  losartan (COZAAR) 100 MG tablet TAKE 1 TABLET BY MOUTH EVERY DAY 02/21/22   Leamon Arnt, MD  montelukast (SINGULAIR) 10 MG tablet TAKE 1 TABLET BY MOUTH EVERYDAY AT BEDTIME 08/06/22   Leamon Arnt, MD  omeprazole (PRILOSEC) 20 MG capsule Take 1 capsule (20 mg total) by mouth daily. 08/15/22   Leamon Arnt, MD  pseudoephedrine (SUDAFED 12 HOUR) 120 MG 12 hr tablet Take 1 tablet (120 mg total) by mouth 2 (two) times daily. 10/31/22   Loralee Pacas, MD  rosuvastatin (CRESTOR) 10 MG tablet TAKE 1 TABLET BY MOUTH EVERY DAY 09/07/22   Leamon Arnt, MD  Saline (SIMPLY SALINE) 0.9 % AERS Place 2 each into the nose daily as needed. 10/31/22   Loralee Pacas, MD  zinc gluconate 50 MG tablet Take 50 mg by mouth daily.    [provider]     Allergies:    Allergies  Allergen Reactions   Ace Inhibitors     cough   Altace [Ramipril] Cough   Lisinopril Cough    Social History:   Social History   Socioeconomic History   Marital status: Married    Spouse name: Not on file   Number of children: 3   Years of education: Not on file   Highest education level: Not on file  Occupational History   Not on file  Tobacco Use   Smoking status: Never   Smokeless tobacco: Never  Vaping Use   Vaping Use: Never used  Substance and Sexual Activity   Alcohol use: No   Drug use: No   Sexual activity: Not Currently  Other Topics Concern   Not on file  Social History Narrative   Not on file   Social Determinants of Health   Financial Resource Strain: Not on file  Food Insecurity: Not on file  Transportation Needs: Not on file  Physical Activity: Not on file  Stress: Not on file  Social Connections: Not on file  Intimate Partner Violence: Not on file    Family History:   The patient's family history includes Arthritis in her mother; COPD in her father and mother; Heart attack in her brother, father, and mother. There is no history of Colon cancer, Colon polyps, Esophageal cancer, Rectal cancer, or Stomach cancer.    ROS:  Please see the history of present illness.  All other ROS reviewed and negative.     Physical Exam/Data:   Vitals:   02/02/23 0410 02/02/23 0413  BP: (!) 144/92   Pulse: 85 87  Resp: (!) 24 19  SpO2: 98% 100%   No intake or output data in the 24  hours ending 02/02/23 0430    11/19/2022   10:09 AM 10/31/2022   11:27 AM 09/19/2022   11:20 AM  Last 3 Weights  Weight (lbs) 113 lb 9.6 oz 113 lb 3.2 oz 115 lb 3.2 oz  Weight (kg) 51.529 kg 51.347 kg 52.254 kg     There is no height or weight on file to calculate BMI.  General:  Well nourished, well developed, in no acute distress HEENT: normal Neck: mildly  elevated JVP Vascular: No carotid bruits; Distal pulses 2+ bilaterally   Cardiac:  normal S1, S2; RRR; no murmur  Lungs:  bilateral inspiratory crackles at the bases. Abd: soft, nontender, no hepatomegaly  Ext: no edema Musculoskeletal:  No deformities, BUE and BLE strength normal and equal Skin: warm and dry  Neuro:  CNs 2-12 intact, no focal abnormalities noted Psych:  Normal affect    EKG:  The ECG that was done was personally reviewed and demonstrates inferolateral STEMI  Laboratory Data:  High Sensitivity Troponin:  No results for input(s): "TROPONINIHS" in the last 720 hours.    Chemistry Recent Labs  Lab 02/02/23 0355  NA 139  K 3.6  CL 106  GLUCOSE 160*  BUN 28*  CREATININE 1.20*    No results for input(s): "PROT", "ALBUMIN", "AST", "ALT", "ALKPHOS", "BILITOT" in the last 168 hours. Lipids No results for input(s): "CHOL", "TRIG", "HDL", "LABVLDL", "LDLCALC", "CHOLHDL" in the last 168 hours. Hematology Recent Labs  Lab 02/02/23 0355  HGB 15.0  HCT 44.0   Thyroid No results for input(s): "TSH", "FREET4" in the last 168 hours. BNPNo results for input(s): "BNP", "PROBNP" in the last 168 hours.  DDimer No results for input(s): "DDIMER" in the last 168 hours.   Radiology/Studies:  No results found.   Assessment and Plan:   Devonia Aanika Tauscher is a 72 y.o. female with PMHx of diabetes, hypertension, CKD and hyperlipidemia who was admitted with an inferolateral STEMI. Hemodynamically stable on arrival with signs of volume overload on exam. Killip class II. She was taken emergently to the  cardiac catheterization lab.  Inferolateral STEMI Hypertension Hyperlipidemia CKD III Type 2 DM (not on insulin)  Plan: - Continue ASA 81 mg daily - Received cangrelor on the table and loaded with brilinta 180 mg. Continue brilinta 90 mg BID - Lipid panel, repeat HbA1c, Lp (a) - Increase Crestor to 40 mg daily - Low dose BB staring in am - Echocardiogram in am - Cardiac rehab - Resume home Losartan - Diabetes protocol   Risk Assessment/Risk Scores:    TIMI Risk Score for ST  Elevation MI:   The patient's TIMI risk score is 5, which indicates a 12.4% risk of all cause mortality at 30 days.        Severity of Illness: The appropriate patient status for this patient is INPATIENT. Inpatient status is judged to be reasonable and necessary in order to provide the required intensity of service to ensure the patient's safety. The patient's presenting symptoms, physical exam findings, and initial radiographic and laboratory data in the context of their chronic comorbidities is felt to place them at high risk for further clinical deterioration. Furthermore, it is not anticipated that the patient will be medically stable for discharge from the hospital within 2 midnights of admission.   * I certify that at the point of admission it is my clinical judgment that the patient will require inpatient hospital care spanning beyond 2 midnights from the point of admission due to high intensity of service, high risk for further deterioration and high frequency of surveillance required.*   For questions or updates, please contact [place] Please consult [email or web address] for contact info under     Signed, Ewing Schlein, MD  02/02/2023 4:30 AM

## 2023-02-02 NOTE — Progress Notes (Addendum)
   02/02/23 0335  Spiritual Encounters  Type of Visit Initial  Care provided to: Family  Conversation partners present during encounter Nurse  Referral source Trauma page  Reason for visit Trauma  OnCall Visit Yes   Chp responded to code STEMI.  Pt not available as medical staff conducted their examination and took Pt to Cath lab.  Chp met pt's family in the ED waiting area and kead them to the 2H/Cathlab waiting area. Chp returned to 2H/Cathlab waiting area to check on family.  They were recently updated by Dr.  Also offered assistance in helping a relative find them in the waiting area. Discovered that Pt's husband is a Tax adviser and daughter works at [employer].

## 2023-02-02 NOTE — Progress Notes (Signed)
Rounding Note    Patient Name: Wanda Medina Date of Encounter: 02/02/2023  [place] Cardiologist: Quay Burow, MD   Subjective   Denies chest pain or sob.   Inpatient Medications    Scheduled Meds:  amLODipine  5 mg Oral Daily   amLODipine  5 mg Oral Daily   [START ON 02/03/2023] aspirin EC  81 mg Oral Daily   Chlorhexidine Gluconate Cloth  6 each Topical Q0600   [START ON 02/03/2023] dapagliflozin propanediol  10 mg Oral QAC breakfast   heparin  4,000 Units Intravenous Once   losartan  100 mg Oral Daily   [START ON 02/03/2023] losartan  50 mg Oral Daily   metoprolol tartrate  12.5 mg Oral BID   pantoprazole  40 mg Oral Daily   rosuvastatin  10 mg Oral Daily   rosuvastatin  40 mg Oral Daily   sodium chloride flush  3 mL Intravenous Q12H   ticagrelor  90 mg Oral BID   Continuous Infusions:  sodium chloride 75 mL/hr at 02/02/23 1012   sodium chloride     tirofiban 0.075 mcg/kg/min (02/02/23 0800)   PRN Meds: sodium chloride, acetaminophen, hydrALAZINE, labetalol, morphine injection, nitroGLYCERIN, ondansetron (ZOFRAN) IV, ondansetron (ZOFRAN) IV, mouth rinse, sodium chloride flush   Vital Signs    Vitals:   02/02/23 0700 02/02/23 0743 02/02/23 0800 02/02/23 0908  BP:   (!) 172/100 (!) 170/105  Pulse: 88  88 87  Resp: 18  (!) 21 17  Temp:  98.3 F (36.8 C)    TempSrc:  Oral    SpO2: 99%  99% 99%  Weight:        Intake/Output Summary (Last 24 hours) at 02/02/2023 1024 Last data filed at 02/02/2023 0800 Gross per 24 hour  Intake 79.38 ml  Output --  Net 79.38 ml      02/02/2023    5:29 AM 11/19/2022   10:09 AM 10/31/2022   11:27 AM  Last 3 Weights  Weight (lbs) 114 lb 113 lb 9.6 oz 113 lb 3.2 oz  Weight (kg) 51.71 kg 51.529 kg 51.347 kg      Telemetry    nsr - Personally Reviewed  ECG    NSR with acute lateral MI  - Personally Reviewed  Physical Exam   GEN: No acute distress.   Neck: No JVD Cardiac: RRR, no  murmurs, rubs, or gallops.  Respiratory: Clear to auscultation bilaterally. GI: Soft, nontender, non-distended  MS: No edema; No deformity. Neuro:  Nonfocal  Psych: Normal affect   Labs    High Sensitivity Troponin:  No results for input(s): "TROPONINIHS" in the last 720 hours.   Chemistry Recent Labs  Lab 02/02/23 0355 02/02/23 0633  NA 139 135  K 3.6 4.4  CL 106 104  CO2  --  23  GLUCOSE 160* 161*  BUN 28* 21  CREATININE 1.20* 1.26*  CALCIUM  --  9.2  PROT  --  7.2  ALBUMIN  --  3.6  AST  --  373*  ALT  --  49*  ALKPHOS  --  64  BILITOT  --  0.9  GFRNONAA  --  45*  ANIONGAP  --  8    Lipids No results for input(s): "CHOL", "TRIG", "HDL", "LABVLDL", "LDLCALC", "CHOLHDL" in the last 168 hours.  Hematology Recent Labs  Lab 02/02/23 0355 02/02/23 0633  WBC  --  8.2  RBC  --  4.66  HGB 15.0 14.3  HCT 44.0 42.7  MCV  --  91.6  MCH  --  30.7  MCHC  --  33.5  RDW  --  14.3  PLT  --  239   Thyroid  Recent Labs  Lab 02/02/23 0633  TSH 4.991*  FREET4 1.59*    BNP Recent Labs  Lab 02/02/23 0633  BNP 78.8    DDimer No results for input(s): "DDIMER" in the last 168 hours.   Radiology    CARDIAC CATHETERIZATION  Result Date: 02/02/2023 Images from the original result were not included.   Prox Cx to Mid Cx lesion is 99% stenosed.   Mid LAD lesion is 60% stenosed.   A drug-eluting stent was successfully placed using a SYNERGY XD 3.0X16.   Post intervention, there is a 0% residual stenosis.   The left ventricular systolic function is normal.   LV end diastolic pressure is mildly elevated.   The left ventricular ejection fraction is 50-55% by visual estimate. Wanda Medina is a 72 y.o. female  [id number] LOCATION:  FACILITY: [facility] PHYSICIAN: Quay Burow, M.D. [birth year] DATE OF PROCEDURE:  02/02/2023 DATE OF DISCHARGE: CARDIAC CATHETERIZATION History obtained from chart review.  Ms. Mixson is a 72 year old married [place] female with history of  hypertension, hyperlipidemia, non-insulin-requiring diabetes and family history for heart disease.  She has been having off-and-on chest pain for several days which was persistent this evening.  EMS was called.  EKG showed inferolateral ST segment elevation.  She was hemodynamically stable.  She was transported urgently to [facility] for angiography and potential intervention. PROCEDURE DESCRIPTION: The patient was brought to the second floor [facility]iac cath lab in the postabsorptive state.  She was not premedicated .  Her right groinwas prepped and shaved in usual sterile fashion. Xylocaine 1% was used for local anesthesia. A 6 French sheath was inserted into the right common femoral artery using standard Seldinger technique.  Ultrasound was used to identify the right common femoral artery and guide access.  A digital image was captured and placed the patient's chart.  The patient received 6000 units  of heparin intravenously.  6 French right left Judkins diagnostic catheters were used for selective coronary angiography and left ventriculography respectively.  Isovue dye is used for the entirety of the case (150 cc of contrast total to patient).  Retrograde aortic, ventricular and pullback pressures were recorded.  LVEDP was measured at 22 mmHg. The patient received 180 mg of p.o. Brilinta.  The ending ACT was 347.  Isovue dye is used for the entirety of the intervention.  Retrograde ordered pressures monitored to the case.  Using a 6 [place] XB 3.0 cm guide catheter along with 0.14 Prowater guidewire I crossed the proximal to mid dominant AV groove circumflex lesion with little difficulty.  Because of the thrombotic nature of the lesion I passed a 5.5 Pronto aspiration thrombectomy catheter and perform multiple passes.  I then predilated with a 2 mm x 12 mm balloon and stented with a 3 mm x 16 mm long Synergy drug-eluting stent deployed at 16 atm.  I postdilated the stent with a 3.25 mm x 12 mm  long noncompliant balloon at 14 atm (3.33 cm) resulting in reduction of a 99% thrombotic AV groove circumflex lesion and a dominant vessel to 0% residual TIMI-3 flow.  The patient tolerated the procedure well.  I did give her Aggrastat bolus followed by a 6-hour infusion.   Successful PCI drug-eluting stenting of proximal to mid AV groove circumflex and a  dominant vessel with inferolateral ST segment elevation.  The door to balloon time was 25 minutes.  The LVEDP was measured at 22 mmHg.  The patient did have normal LV function.  She had 70% segmental calcified mid LAD stenosis after the second diagonal branch.  She will need 12 months of uninterrupted DAPT along with high-dose statin therapy, low-dose beta-blocker, SGLT2.  I anticipate early discharge within 48 hours (fast-track).  The sheath was sewn securely in place.  The patient left lab in stable condition. Quay Burow. MD, [facility] 02/02/2023 5:40 AM     Cardiac Studies   See above. Echo is pending  Patient Profile     72 y.o. female admitted with acute lateral MI, s/p PCI  Assessment & Plan    Lateral STEMI - she is s/p PCI and doing well with no recurrent chest pain. Continue current meds. 2D echo is pending.  CAD - she will be discharged home with GDMT. No change for now.  For questions or updates, please contact [facility] Please consult [email or web address] for contact info under   Signed, Cristopher Peru, MD  02/02/2023, 10:24 AM

## 2023-02-02 NOTE — ED Triage Notes (Signed)
CP woke her up at 0200 mid chest - with nausea and SOB - intermittent CP since Thursday .  EKG read STEMI

## 2023-02-03 DIAGNOSIS — I2119 ST elevation (STEMI) myocardial infarction involving other coronary artery of inferior wall: Secondary | ICD-10-CM | POA: Diagnosis not present

## 2023-02-03 LAB — BASIC METABOLIC PANEL
Anion gap: 9 (ref 5–15)
BUN: 19 mg/dL (ref 8–23)
CO2: 20 mmol/L — ABNORMAL LOW (ref 22–32)
Calcium: 8.5 mg/dL — ABNORMAL LOW (ref 8.9–10.3)
Chloride: 104 mmol/L (ref 98–111)
Creatinine, Ser: 1.33 mg/dL — ABNORMAL HIGH (ref 0.44–1.00)
GFR, Estimated: 43 mL/min — ABNORMAL LOW (ref >60)
Glucose, Bld: 193 mg/dL — ABNORMAL HIGH (ref 70–99)
Potassium: 4 mmol/L (ref 3.5–5.1)
Sodium: 133 mmol/L — ABNORMAL LOW (ref 135–145)

## 2023-02-03 LAB — CBC WITH DIFFERENTIAL/PLATELET
Abs Immature Granulocytes: 0.02 10*3/uL (ref 0.00–0.07)
Basophils Absolute: 0 10*3/uL (ref 0.0–0.1)
Basophils Relative: 0 %
Eosinophils Absolute: 0.1 10*3/uL (ref 0.0–0.5)
Eosinophils Relative: 2 %
HCT: 38.8 % (ref 36.0–46.0)
Hemoglobin: 12.6 g/dL (ref 12.0–15.0)
Immature Granulocytes: 0 %
Lymphocytes Relative: 18 %
Lymphs Abs: 1.3 10*3/uL (ref 0.7–4.0)
MCH: 30.3 pg (ref 26.0–34.0)
MCHC: 32.5 g/dL (ref 30.0–36.0)
MCV: 93.3 fL (ref 80.0–100.0)
Monocytes Absolute: 0.6 10*3/uL (ref 0.1–1.0)
Monocytes Relative: 8 %
Neutro Abs: 5.2 10*3/uL (ref 1.7–7.7)
Neutrophils Relative %: 72 %
Platelets: 249 10*3/uL (ref 150–400)
RBC: 4.16 MIL/uL (ref 3.87–5.11)
RDW: 14.7 % (ref 11.5–15.5)
WBC: 7.2 10*3/uL (ref 4.0–10.5)
nRBC: 0 % (ref 0.0–0.2)

## 2023-02-03 LAB — POCT ACTIVATED CLOTTING TIME
Activated Clotting Time: 293 seconds
Activated Clotting Time: 347 seconds
Activated Clotting Time: 141 seconds

## 2023-02-03 LAB — TROPONIN I (HIGH SENSITIVITY): Troponin I (High Sensitivity): >24000 ng/L (ref <18)

## 2023-02-03 LAB — GLUCOSE, CAPILLARY
Glucose-Capillary: 150 mg/dL — ABNORMAL HIGH (ref 70–99)
Glucose-Capillary: 145 mg/dL — ABNORMAL HIGH (ref 70–99)
Glucose-Capillary: 165 mg/dL — ABNORMAL HIGH (ref 70–99)
Glucose-Capillary: 207 mg/dL — ABNORMAL HIGH (ref 70–99)

## 2023-02-03 LAB — LIPOPROTEIN A (LPA): Lipoprotein (a): 168.9 nmol/L — ABNORMAL HIGH (ref <75.0)

## 2023-02-03 NOTE — Progress Notes (Signed)
Right femoral sheath pulled at 2038 on 02/02/2023.  Pressure held for 15 minutes.  Site is a level 0 with no bleeding and no hematoma.  Vital signs stable with palpable pulses in RLE. Patient educated on post removal care.

## 2023-02-03 NOTE — Progress Notes (Signed)
Rounding Note    Patient Name: Wanda Medina Date of Encounter: 02/03/2023  [facility] Cardiologist: Quay Burow, MD   Subjective   C/o some dyspepsia treated with protonix.  Inpatient Medications    Scheduled Meds:  amLODipine  5 mg Oral Daily   aspirin EC  81 mg Oral Daily   Chlorhexidine Gluconate Cloth  6 each Topical Q0600   dapagliflozin propanediol  10 mg Oral QAC breakfast   heparin  4,000 Units Intravenous Once   losartan  100 mg Oral Daily   metoprolol tartrate  12.5 mg Oral BID   pantoprazole  40 mg Oral Daily   rosuvastatin  40 mg Oral Daily   sodium chloride flush  3 mL Intravenous Q12H   ticagrelor  90 mg Oral BID   Continuous Infusions:  sodium chloride     PRN Meds: sodium chloride, acetaminophen, morphine injection, nitroGLYCERIN, ondansetron (ZOFRAN) IV, ondansetron (ZOFRAN) IV, mouth rinse, sodium chloride flush   Vital Signs    Vitals:   02/03/23 0700 02/03/23 0800 02/03/23 0900 02/03/23 1000  BP: 104/63 90/62 108/71 116/75  Pulse: 94 93 94   Resp: 19 (!) 27 17 (!) 21  Temp:      TempSrc:      SpO2: 98% 98% 100% 100%  Weight:        Intake/Output Summary (Last 24 hours) at 02/03/2023 1042 Last data filed at 02/03/2023 0800 Gross per 24 hour  Intake 921.99 ml  Output 300 ml  Net 621.99 ml      02/02/2023    5:29 AM 11/19/2022   10:09 AM 10/31/2022   11:27 AM  Last 3 Weights  Weight (lbs) 114 lb 113 lb 9.6 oz 113 lb 3.2 oz  Weight (kg) 51.71 kg 51.529 kg 51.347 kg      Telemetry    nsr - Personally Reviewed  ECG    Nsr with inferolateral MI - Personally Reviewed  Physical Exam   GEN: No acute distress.   Neck: No JVD Cardiac: RRR, no murmurs, rubs, or gallops.  Respiratory: Clear to auscultation bilaterally. GI: Soft, nontender, non-distended  MS: No edema; No deformity. Neuro:  Nonfocal  Psych: Normal affect   Labs    High Sensitivity Troponin:   Recent Labs  Lab 02/02/23 1313  02/03/23 0622  TROPONINIHS >24,000* >24,000*     Chemistry Recent Labs  Lab 02/02/23 0355 02/02/23 0633 02/03/23 0208  NA 139 135 133*  K 3.6 4.4 4.0  CL 106 104 104  CO2  --  23 20*  GLUCOSE 160* 161* 193*  BUN 28* 21 19  CREATININE 1.20* 1.26* 1.33*  CALCIUM  --  9.2 8.5*  PROT  --  7.2  --   ALBUMIN  --  3.6  --   AST  --  373*  --   ALT  --  49*  --   ALKPHOS  --  64  --   BILITOT  --  0.9  --   GFRNONAA  --  45* 43*  ANIONGAP  --  8 9    Lipids No results for input(s): "CHOL", "TRIG", "HDL", "LABVLDL", "LDLCALC", "CHOLHDL" in the last 168 hours.  Hematology Recent Labs  Lab 02/02/23 0355 02/02/23 0633 02/03/23 0208  WBC  --  8.2 7.2  RBC  --  4.66 4.16  HGB 15.0 14.3 12.6  HCT 44.0 42.7 38.8  MCV  --  91.6 93.3  MCH  --  30.7 30.3  MCHC  --  33.5 32.5  RDW  --  14.3 14.7  PLT  --  239 249   Thyroid  Recent Labs  Lab 02/02/23 0633  TSH 4.991*  FREET4 1.59*    BNP Recent Labs  Lab 02/02/23 0633  BNP 78.8    DDimer No results for input(s): "DDIMER" in the last 168 hours.   Radiology    ECHOCARDIOGRAM COMPLETE  Result Date: 02/02/2023    ECHOCARDIOGRAM REPORT   Patient Name:   ARZU ASPLUND Kuhl Date of Exam: 02/02/2023 Medical Rec #:  [id number]                    Height:       61.0 in Accession #:    [id number]                   Weight:       114.0 lb Date of Birth:  03/03/1951                     BSA:          1.487 m Patient Age:    34 years                     BP:           170/105 mmHg Patient Gender: F                            HR:           78 bpm. Exam Location:  Inpatient Procedure: 2D Echo, Cardiac Doppler and Color Doppler Indications:    CAD of native vessel  History:        Patient has no prior history of Echocardiogram examinations.                 Signs/Symptoms:Chest Pain; Risk Factors:Hypertension and                 Dyslipidemia.  Sonographer:    Johny Chess RDCS Referring Phys: [id number] EZEQUIEL D MUNOZ GONZALEZ   Sonographer Comments: Could not lie on left side due to recent cath. IMPRESSIONS  1. There is severe hypokinesis of the entire inferolateral LV wall and the mid-to-apical inferior LV wall. Left ventricular ejection fraction, by estimation, is 45 to 50%. The left ventricle has mildly decreased function. The left ventricle demonstrates  regional wall motion abnormalities (see scoring diagram/findings for description). There is mild left ventricular hypertrophy of the basal-septal segment. Left ventricular diastolic parameters are consistent with Grade I diastolic dysfunction (impaired relaxation).  2. Right ventricular systolic function is normal. The right ventricular size is normal. Tricuspid regurgitation signal is inadequate for assessing PA pressure.  3. The mitral valve is grossly normal. Mild mitral valve regurgitation.  4. The aortic valve is tricuspid. There is mild calcification of the aortic valve. There is mild thickening of the aortic valve. Aortic valve regurgitation is trivial. Aortic valve sclerosis/calcification is present, without any evidence of aortic stenosis. Comparison(s): No prior Echocardiogram. FINDINGS  Left Ventricle: There is severe hypokinesis of the entire inferolateral LV wall and the mid-to-apical inferior LV wall. Left ventricular ejection fraction, by estimation, is 45 to 50%. The left ventricle has mildly decreased function. The left ventricle  demonstrates regional wall motion abnormalities. The left ventricular internal cavity size was normal in size. There is mild left ventricular hypertrophy of the basal-septal segment. Left ventricular diastolic parameters  are consistent with Grade I diastolic dysfunction (impaired relaxation). Right Ventricle: The right ventricular size is normal. No increase in right ventricular wall thickness. Right ventricular systolic function is normal. Tricuspid regurgitation signal is inadequate for assessing PA pressure. Left Atrium: Left atrial size  was normal in size. Right Atrium: Right atrial size was normal in size. Pericardium: There is no evidence of pericardial effusion. Mitral Valve: The mitral valve is grossly normal. There is mild thickening of the mitral valve leaflet(s). There is mild calcification of the mitral valve leaflet(s). Mild to moderate mitral annular calcification. Mild mitral valve regurgitation. Tricuspid Valve: The tricuspid valve is normal in structure. Tricuspid valve regurgitation is not demonstrated. Aortic Valve: The aortic valve is tricuspid. There is mild calcification of the aortic valve. There is mild thickening of the aortic valve. Aortic valve regurgitation is trivial. Aortic valve sclerosis/calcification is present, without any evidence of aortic stenosis. Pulmonic Valve: The pulmonic valve was normal in structure. Pulmonic valve regurgitation is trivial. Aorta: The aortic root and ascending aorta are structurally normal, with no evidence of dilitation. IAS/Shunts: The atrial septum is grossly normal.  LEFT VENTRICLE PLAX 2D LVIDd:         3.70 cm     Diastology LVIDs:         2.90 cm     LV e' medial:    5.55 cm/s LV PW:         0.90 cm     LV E/e' medial:  9.5 LV IVS:        1.10 cm     LV e' lateral:   4.68 cm/s LVOT diam:     1.70 cm     LV E/e' lateral: 11.3 LV SV:         24 LV SV Index:   16 LVOT Area:     2.27 cm  LV Volumes (MOD) LV vol d, MOD A2C: 41.6 ml LV vol d, MOD A4C: 46.9 ml LV vol s, MOD A2C: 21.8 ml LV vol s, MOD A4C: 26.4 ml LV SV MOD A2C:     19.8 ml LV SV MOD A4C:     46.9 ml LV SV MOD BP:      21.6 ml RIGHT VENTRICLE            IVC RV Basal diam:  1.80 cm    IVC diam: 1.30 cm RV S prime:     9.90 cm/s LEFT ATRIUM             Index        RIGHT ATRIUM          Index LA diam:        3.30 cm 2.22 cm/m   RA Area:     6.87 cm LA Vol (A2C):   34.2 ml 22.99 ml/m  RA Volume:   11.10 ml 7.46 ml/m LA Vol (A4C):   30.8 ml 20.71 ml/m LA Biplane Vol: 33.1 ml 22.25 ml/m  AORTIC VALVE LVOT Vmax:   53.50 cm/s  LVOT Vmean:  36.200 cm/s LVOT VTI:    0.107 m  AORTA Ao Root diam: 2.90 cm Ao Asc diam:  3.20 cm MITRAL VALVE MV Area (PHT): 4.15 cm    SHUNTS MV Decel Time: 183 msec    Systemic VTI:  0.11 m MR Peak grad: 105.7 mmHg   Systemic Diam: 1.70 cm MR Mean grad: 65.0 mmHg MR Vmax:      514.00 cm/s MR Vmean:  382.0 cm/s MV E velocity: 52.90 cm/s MV A velocity: 87.50 cm/s MV E/A ratio:  0.60 Gwyndolyn Kaufman MD Electronically signed by Gwyndolyn Kaufman MD Signature Date/Time: 02/02/2023/2:23:45 PM    Final    CARDIAC CATHETERIZATION  Result Date: 02/02/2023 Images from the original result were not included.   Prox Cx to Mid Cx lesion is 99% stenosed.   Mid LAD lesion is 60% stenosed.   A drug-eluting stent was successfully placed using a SYNERGY XD 3.0X16.   Post intervention, there is a 0% residual stenosis.   The left ventricular systolic function is normal.   LV end diastolic pressure is mildly elevated.   The left ventricular ejection fraction is 50-55% by visual estimate. Wanda Medina is a 72 y.o. female  [id number] LOCATION:  FACILITY: [facility] PHYSICIAN: Quay Burow, M.D. 09-08-1951 DATE OF PROCEDURE:  02/02/2023 DATE OF DISCHARGE: CARDIAC CATHETERIZATION History obtained from chart review.  Ms. Tierrablanca is a 72 year old married [place] female with history of hypertension, hyperlipidemia, non-insulin-requiring diabetes and family history for heart disease.  She has been having off-and-on chest pain for several days which was persistent this evening.  EMS was called.  EKG showed inferolateral ST segment elevation.  She was hemodynamically stable.  She was transported urgently to [facility] for angiography and potential intervention. PROCEDURE DESCRIPTION: The patient was brought to the second floor [facility]iac cath lab in the postabsorptive state.  She was not premedicated .  Her right groinwas prepped and shaved in usual sterile fashion. Xylocaine 1% was used for local anesthesia. A 6  French sheath was inserted into the right common femoral artery using standard Seldinger technique.  Ultrasound was used to identify the right common femoral artery and guide access.  A digital image was captured and placed the patient's chart.  The patient received 6000 units  of heparin intravenously.  6 French right left Judkins diagnostic catheters were used for selective coronary angiography and left ventriculography respectively.  Isovue dye is used for the entirety of the case (150 cc of contrast total to patient).  Retrograde aortic, ventricular and pullback pressures were recorded.  LVEDP was measured at 22 mmHg. The patient received 180 mg of p.o. Brilinta.  The ending ACT was 347.  Isovue dye is used for the entirety of the intervention.  Retrograde ordered pressures monitored to the case.  Using a 6 [place] XB 3.0 cm guide catheter along with 0.14 Prowater guidewire I crossed the proximal to mid dominant AV groove circumflex lesion with little difficulty.  Because of the thrombotic nature of the lesion I passed a 5.5 Pronto aspiration thrombectomy catheter and perform multiple passes.  I then predilated with a 2 mm x 12 mm balloon and stented with a 3 mm x 16 mm long Synergy drug-eluting stent deployed at 16 atm.  I postdilated the stent with a 3.25 mm x 12 mm long noncompliant balloon at 14 atm (3.33 cm) resulting in reduction of a 99% thrombotic AV groove circumflex lesion and a dominant vessel to 0% residual TIMI-3 flow.  The patient tolerated the procedure well.  I did give her Aggrastat bolus followed by a 6-hour infusion.   Successful PCI drug-eluting stenting of proximal to mid AV groove circumflex and a dominant vessel with inferolateral ST segment elevation.  The door to balloon time was 25 minutes.  The LVEDP was measured at 22 mmHg.  The patient did have normal LV function.  She had 70% segmental calcified mid LAD stenosis after  the second diagonal branch.  She will need 12 months of  uninterrupted DAPT along with high-dose statin therapy, low-dose beta-blocker, SGLT2.  I anticipate early discharge within 48 hours (fast-track).  The sheath was sewn securely in place.  The patient left lab in stable condition. Quay Burow. MD, [facility] 02/02/2023 5:40 AM     Cardiac Studies   See above  Patient Profile     72 y.o. female admitted with acute inferolateral MI  Assessment & Plan    Inferolateral STEMI due to an occluded LCX, s/p PCI - she has some pain yesterday with no new ECG changes and I gave her IV metoprolol and MSO4 and her symptoms resolved and have not returned. Continue supportive care and uptitrate meds. Ok to start ambulating. HTN - her bp has been low. We have stopped amlodipine and held her losartan. I would anticipate adding entresto once the bp improves and uptitration of her beta blocker.   For questions or updates, please contact [place] Please consult [email or web address] for contact info under     Signed, Cristopher Peru, MD  02/03/2023, 10:42 AM

## 2023-02-03 NOTE — Care Management (Addendum)
  Transition of Care [facility]) Screening Note   Patient Details  Name: Wanda Medina Date of Birth: Jul 05, 1951   Transition of Care [facility]) CM/SW Contact:    Bethena Roys, RN Phone Number: 02/03/2023, 9:47 AM    Transition of Care Department [facility]) has reviewed the patient and no TOC needs have been identified at this time. Patient presented for chest pain post PCI. Benefits check submitted for Brilinta. Case Manager will continue to monitor patient advancement through interdisciplinary progression rounds. If new patient transition needs arise, please place a TOC consult.

## 2023-02-04 ENCOUNTER — Other Ambulatory Visit (HOSPITAL_COMMUNITY): Payer: Self-pay

## 2023-02-04 ENCOUNTER — Telehealth: Payer: Self-pay | Admitting: Family Medicine

## 2023-02-04 ENCOUNTER — Encounter (HOSPITAL_COMMUNITY): Payer: Self-pay | Admitting: Cardiovascular Disease

## 2023-02-04 DIAGNOSIS — I2119 ST elevation (STEMI) myocardial infarction involving other coronary artery of inferior wall: Secondary | ICD-10-CM | POA: Diagnosis not present

## 2023-02-04 LAB — GLUCOSE, CAPILLARY
Glucose-Capillary: 139 mg/dL — ABNORMAL HIGH (ref 70–99)
Glucose-Capillary: 105 mg/dL — ABNORMAL HIGH (ref 70–99)
Glucose-Capillary: 179 mg/dL — ABNORMAL HIGH (ref 70–99)
Glucose-Capillary: 175 mg/dL — ABNORMAL HIGH (ref 70–99)

## 2023-02-04 MED ORDER — TRULICITY 0.75 MG/0.5ML ~~LOC~~ SOAJ: 1.5000 mg | SUBCUTANEOUS | 3 refills | Status: DC

## 2023-02-04 MED ORDER — TRAMADOL HCL 50 MG PO TABS
50.0000 mg | ORAL_TABLET | Freq: Once | ORAL | Status: AC
  Administered 2023-02-04: 50 mg via ORAL
  Filled 2023-02-04: qty 1

## 2023-02-04 MED FILL — Verapamil HCl IV Soln 2.5 MG/ML: INTRAVENOUS | Qty: 2 | Status: AC

## 2023-02-04 MED FILL — Nitroglycerin IV Soln 100 MCG/ML in D5W: INTRA_ARTERIAL | Qty: 10 | Status: AC

## 2023-02-04 NOTE — Progress Notes (Signed)
Rounding Note    Patient Name: Wanda Medina Date of Encounter: 02/04/2023  [place] Cardiologist: Quay Burow, MD   Subjective   No recurrent chest pain.  Shortness of breath has improved.  Inpatient Medications    Scheduled Meds:  aspirin EC  81 mg Oral Daily   Chlorhexidine Gluconate Cloth  6 each Topical Q0600   dapagliflozin propanediol  10 mg Oral QAC breakfast   heparin  4,000 Units Intravenous Once   losartan  100 mg Oral Daily   metoprolol tartrate  12.5 mg Oral BID   pantoprazole  40 mg Oral Daily   rosuvastatin  40 mg Oral Daily   sodium chloride flush  3 mL Intravenous Q12H   ticagrelor  90 mg Oral BID   Continuous Infusions:  sodium chloride     PRN Meds: sodium chloride, acetaminophen, morphine injection, nitroGLYCERIN, ondansetron (ZOFRAN) IV, ondansetron (ZOFRAN) IV, mouth rinse, sodium chloride flush   Vital Signs    Vitals:   02/04/23 0400 02/04/23 0500 02/04/23 0600 02/04/23 0700  BP: 102/70 104/71 (!) 118/59 107/66  Pulse: 79 87 84 86  Resp: 18 (!) 29 (!) 24 18  Temp:   98 F (36.7 C)   TempSrc:   Oral   SpO2: 98% 99% 99% 98%  Weight:        Intake/Output Summary (Last 24 hours) at 02/04/2023 0821 Last data filed at 02/03/2023 2100 Gross per 24 hour  Intake 420 ml  Output --  Net 420 ml      02/02/2023    5:29 AM 11/19/2022   10:09 AM 10/31/2022   11:27 AM  Last 3 Weights  Weight (lbs) 114 lb 113 lb 9.6 oz 113 lb 3.2 oz  Weight (kg) 51.71 kg 51.529 kg 51.347 kg      Telemetry    Sinus rhythm at 93 - Personally Reviewed  ECG    02/03/2023 ECG - Personally Reviewed normal sinus rhythm at 87 bpm.  Q waves 2 3 and F, V3 through V6 with residual 1 mm ST elevation.  Physical Exam     GEN: No acute distress.   Neck: No JVD Cardiac: RRR, no murmurs, rubs, or gallops.  Respiratory: Clear to auscultation bilaterally. GI: Soft, nontender, non-distended  R groin cath site stable MS: No edema; No  deformity. Neuro:  Nonfocal  Psych: Normal affect   Labs    High Sensitivity Troponin:   Recent Labs  Lab 02/02/23 1313 02/03/23 0622  TROPONINIHS >24,000* >24,000*     Chemistry Recent Labs  Lab 02/02/23 0355 02/02/23 0633 02/03/23 0208  NA 139 135 133*  K 3.6 4.4 4.0  CL 106 104 104  CO2  --  23 20*  GLUCOSE 160* 161* 193*  BUN 28* 21 19  CREATININE 1.20* 1.26* 1.33*  CALCIUM  --  9.2 8.5*  PROT  --  7.2  --   ALBUMIN  --  3.6  --   AST  --  373*  --   ALT  --  49*  --   ALKPHOS  --  64  --   BILITOT  --  0.9  --   GFRNONAA  --  45* 43*  ANIONGAP  --  8 9    Lipids No results for input(s): "CHOL", "TRIG", "HDL", "LABVLDL", "LDLCALC", "CHOLHDL" in the last 168 hours.  Hematology Recent Labs  Lab 02/02/23 0355 02/02/23 0633 02/03/23 0208  WBC  --  8.2 7.2  RBC  --  4.66 4.16  HGB 15.0 14.3 12.6  HCT 44.0 42.7 38.8  MCV  --  91.6 93.3  MCH  --  30.7 30.3  MCHC  --  33.5 32.5  RDW  --  14.3 14.7  PLT  --  239 249   Thyroid  Recent Labs  Lab 02/02/23 0633  TSH 4.991*  FREET4 1.59*    BNP Recent Labs  Lab 02/02/23 0633  BNP 78.8    DDimer No results for input(s): "DDIMER" in the last 168 hours.   Radiology    ECHOCARDIOGRAM COMPLETE  Result Date: 02/02/2023    ECHOCARDIOGRAM REPORT   Patient Name:   Wanda Medina Date of Exam: 02/02/2023 Medical Rec #:  [id number]                    Height:       61.0 in Accession #:    [id number]                   Weight:       114.0 lb Date of Birth:  11-01-1951                     BSA:          1.487 m Patient Age:    72 years                     BP:           170/105 mmHg Patient Gender: F                            HR:           78 bpm. Exam Location:  Inpatient Procedure: 2D Echo, Cardiac Doppler and Color Doppler Indications:    CAD of native vessel  History:        Patient has no prior history of Echocardiogram examinations.                 Signs/Symptoms:Chest Pain; Risk Factors:Hypertension and                  Dyslipidemia.  Sonographer:    Johny Chess RDCS Referring Phys: [id number] EZEQUIEL D MUNOZ GONZALEZ  Sonographer Comments: Could not lie on left side due to recent cath. IMPRESSIONS  1. There is severe hypokinesis of the entire inferolateral LV wall and the mid-to-apical inferior LV wall. Left ventricular ejection fraction, by estimation, is 45 to 50%. The left ventricle has mildly decreased function. The left ventricle demonstrates  regional wall motion abnormalities (see scoring diagram/findings for description). There is mild left ventricular hypertrophy of the basal-septal segment. Left ventricular diastolic parameters are consistent with Grade I diastolic dysfunction (impaired relaxation).  2. Right ventricular systolic function is normal. The right ventricular size is normal. Tricuspid regurgitation signal is inadequate for assessing PA pressure.  3. The mitral valve is grossly normal. Mild mitral valve regurgitation.  4. The aortic valve is tricuspid. There is mild calcification of the aortic valve. There is mild thickening of the aortic valve. Aortic valve regurgitation is trivial. Aortic valve sclerosis/calcification is present, without any evidence of aortic stenosis. Comparison(s): No prior Echocardiogram. FINDINGS  Left Ventricle: There is severe hypokinesis of the entire inferolateral LV wall and the mid-to-apical inferior LV wall. Left ventricular ejection fraction, by estimation, is 45 to 50%. The left ventricle has mildly decreased function. The left  ventricle  demonstrates regional wall motion abnormalities. The left ventricular internal cavity size was normal in size. There is mild left ventricular hypertrophy of the basal-septal segment. Left ventricular diastolic parameters are consistent with Grade I diastolic dysfunction (impaired relaxation). Right Ventricle: The right ventricular size is normal. No increase in right ventricular wall thickness. Right ventricular systolic  function is normal. Tricuspid regurgitation signal is inadequate for assessing PA pressure. Left Atrium: Left atrial size was normal in size. Right Atrium: Right atrial size was normal in size. Pericardium: There is no evidence of pericardial effusion. Mitral Valve: The mitral valve is grossly normal. There is mild thickening of the mitral valve leaflet(s). There is mild calcification of the mitral valve leaflet(s). Mild to moderate mitral annular calcification. Mild mitral valve regurgitation. Tricuspid Valve: The tricuspid valve is normal in structure. Tricuspid valve regurgitation is not demonstrated. Aortic Valve: The aortic valve is tricuspid. There is mild calcification of the aortic valve. There is mild thickening of the aortic valve. Aortic valve regurgitation is trivial. Aortic valve sclerosis/calcification is present, without any evidence of aortic stenosis. Pulmonic Valve: The pulmonic valve was normal in structure. Pulmonic valve regurgitation is trivial. Aorta: The aortic root and ascending aorta are structurally normal, with no evidence of dilitation. IAS/Shunts: The atrial septum is grossly normal.  LEFT VENTRICLE PLAX 2D LVIDd:         3.70 cm     Diastology LVIDs:         2.90 cm     LV e' medial:    5.55 cm/s LV PW:         0.90 cm     LV E/e' medial:  9.5 LV IVS:        1.10 cm     LV e' lateral:   4.68 cm/s LVOT diam:     1.70 cm     LV E/e' lateral: 11.3 LV SV:         24 LV SV Index:   16 LVOT Area:     2.27 cm  LV Volumes (MOD) LV vol d, MOD A2C: 41.6 ml LV vol d, MOD A4C: 46.9 ml LV vol s, MOD A2C: 21.8 ml LV vol s, MOD A4C: 26.4 ml LV SV MOD A2C:     19.8 ml LV SV MOD A4C:     46.9 ml LV SV MOD BP:      21.6 ml RIGHT VENTRICLE            IVC RV Basal diam:  1.80 cm    IVC diam: 1.30 cm RV S prime:     9.90 cm/s LEFT ATRIUM             Index        RIGHT ATRIUM          Index LA diam:        3.30 cm 2.22 cm/m   RA Area:     6.87 cm LA Vol (A2C):   34.2 ml 22.99 ml/m  RA Volume:   11.10 ml  7.46 ml/m LA Vol (A4C):   30.8 ml 20.71 ml/m LA Biplane Vol: 33.1 ml 22.25 ml/m  AORTIC VALVE LVOT Vmax:   53.50 cm/s LVOT Vmean:  36.200 cm/s LVOT VTI:    0.107 m  AORTA Ao Root diam: 2.90 cm Ao Asc diam:  3.20 cm MITRAL VALVE MV Area (PHT): 4.15 cm    SHUNTS MV Decel Time: 183 msec    Systemic VTI:  0.11 m  MR Peak grad: 105.7 mmHg   Systemic Diam: 1.70 cm MR Mean grad: 65.0 mmHg MR Vmax:      514.00 cm/s MR Vmean:     382.0 cm/s MV E velocity: 52.90 cm/s MV A velocity: 87.50 cm/s MV E/A ratio:  0.60 Gwyndolyn Kaufman MD Electronically signed by Gwyndolyn Kaufman MD Signature Date/Time: 02/02/2023/2:23:45 PM    Final     Cardiac Studies    CATH/PCI: 02/02/2023 IMPRESSION: Successful PCI drug-eluting stenting of proximal to mid AV groove circumflex and a dominant vessel with inferolateral ST segment elevation.  The door to balloon time was 25 minutes.  The LVEDP was measured at 22 mmHg.  The patient did have normal LV function.  She had 70% segmental calcified mid LAD stenosis after the second diagonal branch.  She will need 12 months of uninterrupted DAPT along with high-dose statin therapy, low-dose beta-blocker, SGLT2.  I anticipate early discharge within 48 hours (fast-track).  The sheath was sewn securely in place.  The patient left lab in stable condition.        Echo: 02/02/2023  1. There is severe hypokinesis of the entire inferolateral LV wall and  the mid-to-apical inferior LV wall. Left ventricular ejection fraction, by  estimation, is 45 to 50%. The left ventricle has mildly decreased  function. The left ventricle demonstrates   regional wall motion abnormalities (see scoring diagram/findings for  description). There is mild left ventricular hypertrophy of the  basal-septal segment. Left ventricular diastolic parameters are consistent  with Grade I diastolic dysfunction (impaired  relaxation).   2. Right ventricular systolic function is normal. The right ventricular  size is  normal. Tricuspid regurgitation signal is inadequate for assessing  PA pressure.   3. The mitral valve is grossly normal. Mild mitral valve regurgitation.   4. The aortic valve is tricuspid. There is mild calcification of the  aortic valve. There is mild thickening of the aortic valve. Aortic valve  regurgitation is trivial. Aortic valve sclerosis/calcification is present,  without any evidence of aortic  stenosis.   Patient Profile     72 y.o. female with history of hypertension, admitted with acute inferolateral myocardial infarction secondary to total proximal left circumflex occlusion.  Assessment & Plan    Day 2 status post inferolateral STEMI secondary to total occlusion of the left circumflex vessel successfully treated with DES stenting.  Troponins greater than 24,000.  Echo shows EF 45% with severe hypokinesis of the entire inferolateral wall and mid to apical inferior LV wall.  Prone is greater than 24,000.  With short door to balloon time hopefully most of wall motion abnormality is stunning rather than true infarction potential for recovery.  Sinew DAPT for minimum of 12 months. Comment CAD: Patient has 60% LAD stenosis.  Will plan medical therapy. Low blood pressure: Patient has received losartan 100 mg already this morning and is on metoprolol tartrate 12.5 mg twice a day.  Resting pulse is in the 90s.  With soft blood pressure will not be able to titrate beta-blocker therapy today but plan to transition to metoprolol succinate 25 mg tomorrow and increase as outpatient.  If LV function remains depressed, consider transition to from losartan to [facility]. Hyperlipidemia: Patient is now on simvastatin 40 mg daily.  LP (a) is elevated at 168.9.  Aim for aggressive lipid lowering therapy to less than 50.  If unable to achieve with rosuvastatin and possible Zetia consider Repatha which can reduce LP(a) by 26 to 30%.  Will check fasting  lipid panel in a.m.   Transfer out of 2 heart today.   Transfer to 6 E.  Ambulate.  Monitor blood pressure and heart rate.  Plan to transition to metoprolol succinate tomorrow with DC tomorrow if remains stable.  For questions or updates, please contact [place] Please consult [email or web address] for contact info under        Signed, Shelva Majestic, MD  02/04/2023, 8:21 AM

## 2023-02-04 NOTE — Telephone Encounter (Signed)
Caller states: -Wanted to let PCP know that patient is currently in hospital following STEMI  - Trulicity 1.5 mg has been out of stock and hard to get refilled  - Pharmacy recommended pcp send in script for trulicity [id number] x 2 syringes to make up the 1.5 mg dose since 0.75 dosage looks orderable  Caller requests: - PCP send in the trulicity [id number] mg, 2 syringes a week rather than 1.

## 2023-02-04 NOTE — TOC Benefit Eligibility Note (Addendum)
Patient Teacher, English as a foreign language completed.    The patient is currently admitted and upon discharge could be taking Brilinta 90 mg.  The current 30 day co-pay is $30.00.   The patient is currently admitted and upon discharge could be taking Entresto 24-26 mg.  The current 30 day co-pay is $45.00.   The patient is currently admitted and upon discharge could be taking Jardiance 10 mg.  Prior Authorization Required  The patient is insured through [place], [place] Patient [place] Patient Advocate Team Direct Number: [phone]  Fax: [phone]

## 2023-02-04 NOTE — Progress Notes (Signed)
Cardiac rehab came by to walk with pt, but pt was eating lunch at this time. Pt was educated on the importance of antiplatelet use, risk factors, heart healthy nutrition, exercise guidelines, NTG, restrictions and CRP II. Pt referred to [facility] CRP.   02/04/23  11:49 AM  Service time is from 1115 to 1148. Dorene Sorrow, BSRT

## 2023-02-05 ENCOUNTER — Other Ambulatory Visit (HOSPITAL_COMMUNITY): Payer: Self-pay

## 2023-02-05 ENCOUNTER — Other Ambulatory Visit: Payer: Self-pay | Admitting: Family Medicine

## 2023-02-05 DIAGNOSIS — E785 Hyperlipidemia, unspecified: Secondary | ICD-10-CM

## 2023-02-05 DIAGNOSIS — E1159 Type 2 diabetes mellitus with other circulatory complications: Secondary | ICD-10-CM

## 2023-02-05 DIAGNOSIS — E7841 Elevated Lipoprotein(a): Secondary | ICD-10-CM

## 2023-02-05 DIAGNOSIS — I152 Hypertension secondary to endocrine disorders: Secondary | ICD-10-CM

## 2023-02-05 LAB — LIPID PANEL
Cholesterol: 111 mg/dL (ref 0–200)
HDL: 52 mg/dL (ref >40)
LDL Cholesterol: 43 mg/dL (ref 0–99)
Total CHOL/HDL Ratio: 2.1 RATIO
Triglycerides: 78 mg/dL (ref <150)
VLDL: 16 mg/dL (ref 0–40)

## 2023-02-05 LAB — GLUCOSE, CAPILLARY
Glucose-Capillary: 127 mg/dL — ABNORMAL HIGH (ref 70–99)
Glucose-Capillary: 171 mg/dL — ABNORMAL HIGH (ref 70–99)

## 2023-02-05 MED ORDER — NITROGLYCERIN 0.4 MG SL SUBL
0.4000 mg | SUBLINGUAL_TABLET | SUBLINGUAL | 1 refills | Status: DC | PRN
  Filled 2023-02-05: qty 25, 7d supply, fill #0

## 2023-02-05 MED ORDER — TICAGRELOR 90 MG PO TABS
90.0000 mg | ORAL_TABLET | Freq: Two times a day (BID) | ORAL | 12 refills | Status: DC
  Filled 2023-02-05: qty 60, 30d supply, fill #0

## 2023-02-05 MED ORDER — ROSUVASTATIN CALCIUM 40 MG PO TABS
40.0000 mg | ORAL_TABLET | Freq: Every day | ORAL | 3 refills | Status: DC
  Filled 2023-02-05: qty 30, 30d supply, fill #0

## 2023-02-05 MED ORDER — ASPIRIN 81 MG PO TBEC
81.0000 mg | DELAYED_RELEASE_TABLET | Freq: Every day | ORAL | 3 refills | Status: DC
  Filled 2023-02-05: qty 90, 90d supply, fill #0

## 2023-02-05 MED ORDER — METOPROLOL SUCCINATE ER 25 MG PO TB24
25.0000 mg | ORAL_TABLET | Freq: Every day | ORAL | 11 refills | Status: DC
  Filled 2023-02-05: qty 30, 30d supply, fill #0

## 2023-02-05 MED ORDER — METOPROLOL SUCCINATE ER 25 MG PO TB24
25.0000 mg | ORAL_TABLET | Freq: Every day | ORAL | Status: DC
  Administered 2023-02-05: 25 mg via ORAL
  Filled 2023-02-05: qty 1

## 2023-02-05 NOTE — Discharge Summary (Addendum)
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   Mid LAD lesion is 60% stenosed.   A drug-eluting stent was successfully placed using a SYNERGY XD 3.0X16.   Post intervention, there is a 0% residual stenosis.   The left ventricular systolic function is normal.   LV end diastolic pressure is mildly elevated.   The left ventricular ejection fraction is 50-55% by visual estimate.  IMPRESSION: Successful PCI drug-eluting stenting of proximal to mid AV groove circumflex and a dominant vessel with inferolateral ST segment elevation.  The door to balloon time was 25 minutes.  The LVEDP was measured at 22 mmHg.  The patient did have normal LV function.  She had 70% segmental calcified mid LAD stenosis after the second diagonal branch.  She will need 12 months of uninterrupted DAPT along with high-dose statin therapy, low-dose beta-blocker, SGLT2.  I anticipate early discharge within 48 hours (fast-track).  The sheath was sewn securely in place.  The patient left lab in stable condition.   Quay Burow. MD, [facility] 02/02/2023 5:40 AM  Diagnostic Dominance: Left  Intervention   Echo: 02/02/2023  IMPRESSIONS     1. There is severe hypokinesis of the entire inferolateral LV wall and  the mid-to-apical inferior LV wall. Left ventricular ejection  fraction, by  estimation, is 45 to 50%. The left ventricle has mildly decreased  function. The left ventricle demonstrates   regional wall motion abnormalities (see scoring diagram/findings for  description). There is mild left ventricular hypertrophy of the  basal-septal segment. Left ventricular diastolic parameters are consistent  with Grade I diastolic dysfunction (impaired  relaxation).   2. Right ventricular systolic function is normal. The right ventricular  size is normal. Tricuspid regurgitation signal is inadequate for assessing  PA pressure.   3. The mitral valve is grossly normal. Mild mitral valve regurgitation.   4. The aortic valve is tricuspid. There is mild calcification of the  aortic valve. There is mild thickening of the aortic valve. Aortic valve  regurgitation is trivial. Aortic valve sclerosis/calcification is present,  without any evidence of aortic  stenosis.   Comparison(s): No prior Echocardiogram.   FINDINGS   Left Ventricle: There is severe hypokinesis of the entire inferolateral  LV wall and the mid-to-apical inferior LV wall. Left ventricular ejection  fraction, by estimation, is 45 to 50%. The left ventricle has mildly  decreased function. The left ventricle   demonstrates regional wall motion abnormalities. The left ventricular  internal cavity size was normal in size. There is mild left ventricular  hypertrophy of the basal-septal segment. Left ventricular diastolic  parameters are consistent with Grade I  diastolic dysfunction (impaired relaxation).   Right Ventricle: The right ventricular size is normal. No increase in  right ventricular wall thickness. Right ventricular systolic function is  normal. Tricuspid regurgitation signal is inadequate  for assessing PA  pressure.   Left Atrium: Left atrial size was normal in size.   Right Atrium: Right atrial size was normal in size.   Pericardium: There is no evidence of pericardial effusion.    Mitral Valve: The mitral valve is grossly normal. There is mild thickening  of the mitral valve leaflet(s). There is mild calcification of the mitral  valve leaflet(s). Mild to moderate mitral annular calcification. Mild  mitral valve regurgitation.   Tricuspid Valve: The tricuspid valve is normal in structure. Tricuspid  valve regurgitation is not demonstrated.   Aortic Valve: The aortic valve is tricuspid. There is mild calcification  of the aortic valve. There is mild thickening of the aortic valve. Aortic  valve regurgitation is trivial. Aortic valve sclerosis/calcification is  present, without any evidence of  aortic stenosis.   Pulmonic Valve: The pulmonic valve was normal in structure. Pulmonic valve  regurgitation is trivial.   Aorta: The aortic root and ascending aorta are structurally normal, with  no evidence of dilitation.   IAS/Shunts: The atrial septum is grossly normal.  _____________   History of Present Illness     Baljit Levora Dredge Seery is a 72 y.o. female with PMHx of diabetes, hypertension, CKD (baseline Cr 1.25-1.34) and hyperlipidemia who presented on 02/02/2023 for the evaluation of chest pain. Ms. Musto presented to the ER complaining of chest pain. Onset was thursday morning and it was described as retrosternal pressure like sensation radiating to the neck that lasted approximately 5 min and relieved spontaneously. She had 3 additional episodes throughout the day, all lasting < 10 minutes. No particular triggers or relieves. On Friday, she had 5 intermittent episodes of similar characteristics and around 9 pm the pain became persistent and associated with diaphoresis and nausea. She took tylenol in attempt to relieve it but after not seeing improvement, she told her daughter at 2 am to call 911.   ECG in the field (3:22 am) showed inferolateral STEMI and she was brought to [facility] for [facility] evaluation. She was loaded with ASA and heparin and brought to the  cardiac catheterization lab.   She is originally from [place]. Has strong Fhx of CAD (both parents died in their 53's with myocardial infarction). She has a brother who in his 22s had an MI. Does not smoke, no alcohol or drugs.   Hospital Course     STEMI -- Underwent cardiac catheterization noted above with successful PCI/DES to the proximal/mid AV groove circumflex.  Also had 70% segmental calcified mid LAD stenosis after second diagonal branch to be treated medically.  Placed on DAPT with aspirin/Brilinta with recommendations for at least 1 year.  High-sensitivity troponin peaked at greater than 24,000.  No recurrent chest pain.  Seen by cardiac rehab. -- Continue aspirin, Brilinta, metoprolol 12.5 mg twice daily, Crestor 40 mg daily  HLD -- LDL 43, HDL 52, LP(a) 168.9 -- Crestor 40 mg daily  HFmrEF ICM -- Echocardiogram showed LVEF of 45 to 50%, hypokinesis of the entire inferior lateral LV and mid to apical inferior LV walls. -- GDMT: Continue metoprolol '25mg'$  daily, losartan 100 mg daily, Farxiga 10 mg daily  DM -- Hgb A1c 6.9 -- Continue Farxiga, trulicity   Patient was seen by Dr. Claiborne Billings and deemed stable for discharge home. Follow up in the office arranged. Medications sent to the [facility] pharmacy. Educated by pharmD prior to discharge.   Did the patient have an acute coronary syndrome (MI, NSTEMI, STEMI, etc) this admission?:  Yes  AHA/ACC Clinical Performance & Quality Measures: Aspirin prescribed? - Yes ADP Receptor Inhibitor (Plavix/Clopidogrel, Brilinta/Ticagrelor or Effient/Prasugrel) prescribed (includes medically managed patients)? - Yes Beta Blocker prescribed? - Yes High Intensity Statin (Lipitor 40-'80mg'$  or Crestor 20-'40mg'$ ) prescribed? - Yes EF assessed during THIS hospitalization? - Yes For EF <40%, was ACEI/ARB prescribed? - Yes For EF <40%, Aldosterone Antagonist (Spironolactone or Eplerenone) prescribed? - Not Applicable (EF >/=  [id number]) Cardiac Rehab Phase II ordered (including medically managed patients)? - Yes       The patient will be scheduled for a TOC follow up appointment in 10-14 days.  A message has been sent to the [facility] and Scheduling Pool at the office where the patient should be seen for follow up.  _____________  Discharge Vitals Blood pressure 103/60, pulse 81, temperature 98 F (36.7 C), temperature source Oral, resp. rate 18, weight 51.7 kg, SpO2 99 %.  Filed Weights   02/02/23 0529  Weight: 51.7 kg    Labs & Radiologic Studies    CBC Recent Labs    02/03/23 0208  WBC 7.2  NEUTROABS 5.2  HGB 12.6  HCT 38.8  MCV 93.3  PLT 0000000   Basic Metabolic Panel Recent Labs    02/03/23 0208  NA 133*  K 4.0  CL 104  CO2 20*  GLUCOSE 193*  BUN 19  CREATININE 1.33*  CALCIUM 8.5*   Liver Function Tests No results for input(s): "AST", "ALT", "ALKPHOS", "BILITOT", "PROT", "ALBUMIN" in the last 72 hours. No results for input(s): "LIPASE", "AMYLASE" in the last 72 hours. High Sensitivity Troponin:   Recent Labs  Lab 02/02/23 1313 02/03/23 0622  TROPONINIHS >24,000* >24,000*    BNP Invalid input(s): "POCBNP" D-Dimer No results for input(s): "DDIMER" in the last 72 hours. Hemoglobin A1C No results for input(s): "HGBA1C" in the last 72 hours. Fasting Lipid Panel Recent Labs    02/05/23 0224  CHOL 111  HDL 52  LDLCALC 43  TRIG 78  CHOLHDL 2.1   Thyroid Function Tests No results for input(s): "TSH", "T4TOTAL", "T3FREE", "THYROIDAB" in the last 72 hours.  Invalid input(s): "FREET3" _____________  ECHOCARDIOGRAM COMPLETE  Result Date: 02/02/2023    ECHOCARDIOGRAM REPORT   Patient Name:   AMINAH EISAMAN Petterson Date of Exam: 02/02/2023 Medical Rec #:  [id number]                    Height:       61.0 in Accession #:    [id number]                   Weight:       114.0 lb Date of Birth:  March 11, 1951                     BSA:          1.487 m Patient Age:    74 years                      BP:           170/105 mmHg Patient Gender: F                            HR:           78 bpm. Exam Location:  Inpatient Procedure: 2D Echo, Cardiac Doppler and Color Doppler Indications:    CAD of native vessel  History:  Patient has no prior history of Echocardiogram examinations.                 Signs/Symptoms:Chest Pain; Risk Factors:Hypertension and                 Dyslipidemia.  Sonographer:    Johny Chess RDCS Referring Phys: [id number] EZEQUIEL D MUNOZ GONZALEZ  Sonographer Comments: Could not lie on left side due to recent cath. IMPRESSIONS  1. There is severe hypokinesis of the entire inferolateral LV wall and the mid-to-apical inferior LV wall. Left ventricular ejection fraction, by estimation, is 45 to 50%. The left ventricle has mildly decreased function. The left ventricle demonstrates  regional wall motion abnormalities (see scoring diagram/findings for description). There is mild left ventricular hypertrophy of the basal-septal segment. Left ventricular diastolic parameters are consistent with Grade I diastolic dysfunction (impaired relaxation).  2. Right ventricular systolic function is normal. The right ventricular size is normal. Tricuspid regurgitation signal is inadequate for assessing PA pressure.  3. The mitral valve is grossly normal. Mild mitral valve regurgitation.  4. The aortic valve is tricuspid. There is mild calcification of the aortic valve. There is mild thickening of the aortic valve. Aortic valve regurgitation is trivial. Aortic valve sclerosis/calcification is present, without any evidence of aortic stenosis. Comparison(s): No prior Echocardiogram. FINDINGS  Left Ventricle: There is severe hypokinesis of the entire inferolateral LV wall and the mid-to-apical inferior LV wall. Left ventricular ejection fraction, by estimation, is 45 to 50%. The left ventricle has mildly decreased function. The left ventricle  demonstrates regional wall motion abnormalities. The left  ventricular internal cavity size was normal in size. There is mild left ventricular hypertrophy of the basal-septal segment. Left ventricular diastolic parameters are consistent with Grade I diastolic dysfunction (impaired relaxation). Right Ventricle: The right ventricular size is normal. No increase in right ventricular wall thickness. Right ventricular systolic function is normal. Tricuspid regurgitation signal is inadequate for assessing PA pressure. Left Atrium: Left atrial size was normal in size. Right Atrium: Right atrial size was normal in size. Pericardium: There is no evidence of pericardial effusion. Mitral Valve: The mitral valve is grossly normal. There is mild thickening of the mitral valve leaflet(s). There is mild calcification of the mitral valve leaflet(s). Mild to moderate mitral annular calcification. Mild mitral valve regurgitation. Tricuspid Valve: The tricuspid valve is normal in structure. Tricuspid valve regurgitation is not demonstrated. Aortic Valve: The aortic valve is tricuspid. There is mild calcification of the aortic valve. There is mild thickening of the aortic valve. Aortic valve regurgitation is trivial. Aortic valve sclerosis/calcification is present, without any evidence of aortic stenosis. Pulmonic Valve: The pulmonic valve was normal in structure. Pulmonic valve regurgitation is trivial. Aorta: The aortic root and ascending aorta are structurally normal, with no evidence of dilitation. IAS/Shunts: The atrial septum is grossly normal.  LEFT VENTRICLE PLAX 2D LVIDd:         3.70 cm     Diastology LVIDs:         2.90 cm     LV e' medial:    5.55 cm/s LV PW:         0.90 cm     LV E/e' medial:  9.5 LV IVS:        1.10 cm     LV e' lateral:   4.68 cm/s LVOT diam:     1.70 cm     LV E/e' lateral: 11.3 LV SV:  24 LV SV Index:   16 LVOT Area:     2.27 cm  LV Volumes (MOD) LV vol d, MOD A2C: 41.6 ml LV vol d, MOD A4C: 46.9 ml LV vol s, MOD A2C: 21.8 ml LV vol s, MOD A4C: 26.4  ml LV SV MOD A2C:     19.8 ml LV SV MOD A4C:     46.9 ml LV SV MOD BP:      21.6 ml RIGHT VENTRICLE            IVC RV Basal diam:  1.80 cm    IVC diam: 1.30 cm RV S prime:     9.90 cm/s LEFT ATRIUM             Index        RIGHT ATRIUM          Index LA diam:        3.30 cm 2.22 cm/m   RA Area:     6.87 cm LA Vol (A2C):   34.2 ml 22.99 ml/m  RA Volume:   11.10 ml 7.46 ml/m LA Vol (A4C):   30.8 ml 20.71 ml/m LA Biplane Vol: 33.1 ml 22.25 ml/m  AORTIC VALVE LVOT Vmax:   53.50 cm/s LVOT Vmean:  36.200 cm/s LVOT VTI:    0.107 m  AORTA Ao Root diam: 2.90 cm Ao Asc diam:  3.20 cm MITRAL VALVE MV Area (PHT): 4.15 cm    SHUNTS MV Decel Time: 183 msec    Systemic VTI:  0.11 m MR Peak grad: 105.7 mmHg   Systemic Diam: 1.70 cm MR Mean grad: 65.0 mmHg MR Vmax:      514.00 cm/s MR Vmean:     382.0 cm/s MV E velocity: 52.90 cm/s MV A velocity: 87.50 cm/s MV E/A ratio:  0.60 Gwyndolyn Kaufman MD Electronically signed by Gwyndolyn Kaufman MD Signature Date/Time: 02/02/2023/2:23:45 PM    Final    CARDIAC CATHETERIZATION  Result Date: 02/02/2023 Images from the original result were not included.   Prox Cx to Mid Cx lesion is 99% stenosed.   Mid LAD lesion is 60% stenosed.   A drug-eluting stent was successfully placed using a SYNERGY XD 3.0X16.   Post intervention, there is a 0% residual stenosis.   The left ventricular systolic function is normal.   LV end diastolic pressure is mildly elevated.   The left ventricular ejection fraction is 50-55% by visual estimate. Gisel Aurea Bunce is a 72 y.o. female  [id number] LOCATION:  FACILITY: [facility] PHYSICIAN: Quay Burow, M.D. [birth year] DATE OF PROCEDURE:  02/02/2023 DATE OF DISCHARGE: CARDIAC CATHETERIZATION History obtained from chart review.  Ms. Woehrle is a 72 year old married [place] female with history of hypertension, hyperlipidemia, non-insulin-requiring diabetes and family history for heart disease.  She has been having off-and-on chest pain for several days which  was persistent this evening.  EMS was called.  EKG showed inferolateral ST segment elevation.  She was hemodynamically stable.  She was transported urgently to [facility] for angiography and potential intervention. PROCEDURE DESCRIPTION: The patient was brought to the second floor [facility] cath lab in the postabsorptive state.  She was not premedicated .  Her right groinwas prepped and shaved in usual sterile fashion. Xylocaine 1% was used for local anesthesia. A 6 French sheath was inserted into the right common femoral artery using standard Seldinger technique.  Ultrasound was used to identify the right common femoral artery and guide access.  A digital image was captured  and placed the patient's chart.  The patient received 6000 units  of heparin intravenously.  6 French right left Judkins diagnostic catheters were used for selective coronary angiography and left ventriculography respectively.  Isovue dye is used for the entirety of the case (150 cc of contrast total to patient).  Retrograde aortic, ventricular and pullback pressures were recorded.  LVEDP was measured at 22 mmHg. The patient received 180 mg of p.o. Brilinta.  The ending ACT was 347.  Isovue dye is used for the entirety of the intervention.  Retrograde ordered pressures monitored to the case.  Using a 6 [place] XB 3.0 cm guide catheter along with 0.14 [place] guidewire I crossed the proximal to mid dominant AV groove circumflex lesion with little difficulty.  Because of the thrombotic nature of the lesion I passed a 5.5 Pronto aspiration thrombectomy catheter and perform multiple passes.  I then predilated with a 2 mm x 12 mm balloon and stented with a 3 mm x 16 mm long Synergy drug-eluting stent deployed at 16 atm.  I postdilated the stent with a 3.25 mm x 12 mm long noncompliant balloon at 14 atm (3.33 cm) resulting in reduction of a 99% thrombotic AV groove circumflex lesion and a dominant vessel to 0% residual TIMI-3 flow.   The patient tolerated the procedure well.  I did give her Aggrastat bolus followed by a 6-hour infusion.   Successful PCI drug-eluting stenting of proximal to mid AV groove circumflex and a dominant vessel with inferolateral ST segment elevation.  The door to balloon time was 25 minutes.  The LVEDP was measured at 22 mmHg.  The patient did have normal LV function.  She had 70% segmental calcified mid LAD stenosis after the second diagonal branch.  She will need 12 months of uninterrupted DAPT along with high-dose statin therapy, low-dose beta-blocker, SGLT2.  I anticipate early discharge within 48 hours (fast-track).  The sheath was sewn securely in place.  The patient left lab in stable condition. Quay Burow. MD, [facility] 02/02/2023 5:40 AM    Disposition   Pt is being discharged home today in good condition.  Follow-up Plans & Appointments     Follow-up Information     Lenna Sciara, NP Follow up on 02/15/2023.   Specialties: Cardiology, Family Medicine Why: at 2:20pm for your follow up with Dr. Rachel Bo' NP Maris Berger information: [address] [place] [address] [place] [address] [phone]                Discharge Instructions     Amb Referral to Cardiac Rehabilitation   Complete by: As directed    Diagnosis:  Coronary Stents STEMI     After initial evaluation and assessments completed: Virtual Based Care may be provided alone or in conjunction with Phase 2 Cardiac Rehab based on patient barriers.: Yes   Intensive Cardiac Rehabilitation (ICR) [place] location only OR Traditional Cardiac Rehabilitation (TCR) *If criteria for ICR are not met will enroll in TCR [facility] only): Yes   Call MD for:  difficulty breathing, headache or visual disturbances   Complete by: As directed    Call MD for:  persistant dizziness or light-headedness   Complete by: As directed    Call MD for:  redness, tenderness, or signs of infection (pain, swelling, redness, odor or green/yellow discharge around  incision site)   Complete by: As directed    Diet - low sodium heart healthy   Complete by: As directed    Discharge instructions  Complete by: As directed    Groin Site Care Refer to this sheet in the next few weeks. These instructions provide you with information on caring for yourself after your procedure. Your caregiver may also give you more specific instructions. Your treatment has been planned according to current medical practices, but problems sometimes occur. Call your caregiver if you have any problems or questions after your procedure. HOME CARE INSTRUCTIONS You may shower 24 hours after the procedure. Remove the bandage (dressing) and gently wash the site with plain soap and water. Gently pat the site dry.  Do not apply powder or lotion to the site.  Do not sit in a bathtub, swimming pool, or whirlpool for 5 to 7 days.  No bending, squatting, or lifting anything over 10 pounds (4.5 kg) as directed by your caregiver.  Inspect the site at least twice daily.  Do not drive home if you are discharged the same day of the procedure. Have someone else drive you.  You may drive 24 hours after the procedure unless otherwise instructed by your caregiver.  What to expect: Any bruising will usually fade within 1 to 2 weeks.  Blood that collects in the tissue (hematoma) may be painful to the touch. It should usually decrease in size and tenderness within 1 to 2 weeks.  SEEK IMMEDIATE MEDICAL CARE IF: You have unusual pain at the groin site or down the affected leg.  You have redness, warmth, swelling, or pain at the groin site.  You have drainage (other than a small amount of blood on the dressing).  You have chills.  You have a fever or persistent symptoms for more than 72 hours.  You have a fever and your symptoms suddenly get worse.  Your leg becomes pale, cool, tingly, or numb.  You have heavy bleeding from the site. Hold pressure on the site. Marland Kitchen  PLEASE DO NOT MISS ANY DOSES OF YOUR  BRILINTA!!!!! Also keep a log of you blood pressures and bring back to your follow up appt. Please call the office with any questions.   Patients taking blood thinners should generally stay away from medicines like ibuprofen, Advil, Motrin, naproxen, and Aleve due to risk of stomach bleeding. You may take Tylenol as directed or talk to your primary doctor about alternatives.   PLEASE ENSURE THAT YOU DO NOT RUN OUT OF YOUR BRILINTA. This medication is very important to remain on for at least one year. IF you have issues obtaining this medication due to cost please CALL the office 3-5 business days prior to running out in order to prevent missing doses of this medication.   Increase activity slowly   Complete by: As directed         Discharge Medications   Allergies as of 02/05/2023       Reactions   Ace Inhibitors    cough   Altace [ramipril] Cough   Lisinopril Cough        Medication List     STOP taking these medications    amLODipine 5 MG tablet Commonly known as: NORVASC       TAKE these medications    aspirin EC 81 MG tablet Take 1 tablet (81 mg total) by mouth daily. Swallow whole.   BD Lancet Ultrafine [id number] Misc 1 application by Does not apply route in the morning and at bedtime.   calcium-vitamin D 500-200 MG-UNIT tablet Commonly known as: OSCAL WITH D Take 1 tablet by mouth daily.   cyclobenzaprine 5  MG tablet Commonly known as: FLEXERIL Take 1 tablet (5 mg total) by mouth 2 (two) times daily as needed for muscle spasms.   dapagliflozin propanediol 10 MG Tabs tablet Commonly known as: Farxiga Take 1 tablet (10 mg total) by mouth daily before breakfast.   ferrous sulfate 324 (65 Fe) MG Tbec Take 324 mg by mouth daily.   fluticasone 50 MCG/ACT nasal spray Commonly known as: FLONASE Place 2 sprays into both nostrils daily. What changed:  when to take this reasons to take this   gabapentin 100 MG capsule Commonly known as: NEURONTIN Take 1  capsule (100 mg total) by mouth 2 (two) times daily.   losartan 100 MG tablet Commonly known as: COZAAR TAKE 1 TABLET BY MOUTH EVERY DAY What changed: when to take this   metoprolol succinate 25 MG 24 hr tablet Commonly known as: TOPROL-XL Take 1 tablet (25 mg total) by mouth daily.   montelukast 10 MG tablet Commonly known as: SINGULAIR TAKE 1 TABLET BY MOUTH EVERYDAY AT BEDTIME What changed: See the new instructions.   nitroGLYCERIN 0.4 MG SL tablet Commonly known as: NITROSTAT Place 1 tablet (0.4 mg total) under the tongue every 5 (five) minutes x 3 doses as needed for chest pain.   omeprazole 20 MG capsule Commonly known as: PRILOSEC Take 1 capsule (20 mg total) by mouth daily.   OneTouch Ultra test strip Generic drug: glucose blood TEST BLOOD SUGARS TWICE DAILY AS NEEDED.   pseudoephedrine 120 MG 12 hr tablet Commonly known as: Sudafed 12 Hour Take 1 tablet (120 mg total) by mouth 2 (two) times daily. What changed:  when to take this reasons to take this   rosuvastatin 40 MG tablet Commonly known as: CRESTOR Take 1 tablet (40 mg total) by mouth daily. What changed:  medication strength how much to take   Simply Saline 0.9 % Aers Generic drug: Saline Place 2 each into the nose daily as needed. What changed: reasons to take this   ticagrelor 90 MG Tabs tablet Commonly known as: BRILINTA Take 1 tablet (90 mg total) by mouth 2 (two) times daily.   Trulicity [id number] 0000000 Sopn Generic drug: Dulaglutide Inject 1.5 mg into the skin once a week. Notes to patient: This is a prior to admission medication, continue as taken at home   zinc gluconate 50 MG tablet Take 50 mg by mouth daily.           Outstanding Labs/Studies   LPF/LFTs in 8 weeks   Duration of Discharge Encounter   Greater than 30 minutes including physician time.  Signed, Reino Bellis, NP 02/05/2023, 10:46 AM   Patient seen and examined. Agree with assessment and plan.  Patient  has been without recurrent chest pain.  She did have small episode of vomiting this morning which he felt was due to reflux.  Plan to transition to metoprolol succinate 25 mg.  Hopefully she will have significant recovery of LV function.  At present we will continue losartan but if LV remains slightly depressed transition to [facility] as outpatient.  Patient now on rosuvastatin for hyperlipidemia.  With elevated LP(a) may also be a benefit from concomitant Repatha.  Patient stable for discharge today.  Plan follow-up with Dr. Gwenlyn Found.   Troy Sine, MD, [facility] 02/05/2023 10:46 AM

## 2023-02-05 NOTE — Progress Notes (Addendum)
Discharge instructions reviewed with Patient and daughter utilizing teach back method no questions at this time. Patient discharged to home. Discharge medicatios from [facility] given to patient.

## 2023-02-05 NOTE — Progress Notes (Signed)
CARDIAC REHAB PHASE I   PRE:  Rate/Rhythm: 86 SR  BP:  Sitting: 99/59   SaO2: 99  MODE:  Ambulation: 470 ft   POST:  Rate/Rhythm: 90 SR  BP:  Sitting: 109/70   SaO2: 100  Pt ambulated in hallway independently, tolerated well with no s/sx. VSS. All questions regarding yesterdays education answered, pt is very eager for CRP2.  [id number]  Colbert Ewing, [place] 02/05/2023 9:50 AM

## 2023-02-06 ENCOUNTER — Telehealth: Payer: Self-pay | Admitting: *Deleted

## 2023-02-06 ENCOUNTER — Telehealth: Payer: Self-pay

## 2023-02-06 NOTE — Transitions of Care (Post Inpatient/ED Visit) (Signed)
   02/06/2023  Name: Wanda Medina MRN: [id number] DOB: 07/31/1951  Today's TOC FU Call Status: Today's TOC FU Call Status:: Successful TOC FU Call Competed TOC FU Call Complete Date: 02/06/23  Transition Care Management Follow-up Telephone Call Date of Discharge: 02/05/23 Discharge Facility: [facility]) Type of Discharge: Inpatient Admission Primary Inpatient Discharge Diagnosis:: Acute ST elevation myocardial infarction (STEMI) of inferior wall ([place]) How have you been since you were released from the hospital?: Better Any questions or concerns?: No  Items Reviewed: Did you receive and understand the discharge instructions provided?: Yes Medications obtained and verified?: Yes (Medications Reviewed) Any new allergies since your discharge?: No Dietary orders reviewed?: Yes Do you have support at home?: Yes  Home Care and Equipment/Supplies: [place] Ordered?: No Any new equipment or medical supplies ordered?: No  Functional Questionnaire: Do you need assistance with bathing/showering or dressing?: No Do you need assistance with meal preparation?: No Do you need assistance with eating?: No Do you have difficulty maintaining continence: No Do you need assistance with getting out of bed/getting out of a chair/moving?: No Do you have difficulty managing or taking your medications?: No  Folllow up appointments reviewed: PCP Follow-up appointment confirmed?: Yes Date of PCP follow-up appointment?: 02/19/23 Follow-up Provider: [facility] Follow-up appointment confirmed?: Yes Date of Specialist follow-up appointment?: 02/15/23 Follow-Up Specialty Provider:: Diona Browner PA-C Do you need transportation to your follow-up appointment?: No Do you understand care options if your condition(s) worsen?: Yes-patient verbalized understanding    Duran LPN [place] Direct Dial [phone]

## 2023-02-06 NOTE — Telephone Encounter (Signed)
-----   Message from Cheryln Manly, NP sent at 02/05/2023 10:40 AM EST ----- Regarding: Follow TOC call Needs TOC call please

## 2023-02-06 NOTE — Telephone Encounter (Signed)
Patient contacted regarding discharge from [facility] on 02/05/23.  Patient understands to follow up with provider Monge  NP on 02/15/23 at 2:20 pm at [facility]. Patient understands discharge instructions? yes  Patient understands medications and regiment? yes  Patient understands to bring all medications to this visit? yes

## 2023-02-15 ENCOUNTER — Encounter: Payer: Self-pay | Admitting: Nurse Practitioner

## 2023-02-15 ENCOUNTER — Ambulatory Visit: Payer: BC Managed Care – PPO | Attending: Nurse Practitioner | Admitting: Nurse Practitioner

## 2023-02-15 VITALS — BP 122/78 | HR 93 | Ht 61.0 in | Wt 119.4 lb

## 2023-02-15 DIAGNOSIS — I10 Essential (primary) hypertension: Secondary | ICD-10-CM | POA: Diagnosis not present

## 2023-02-15 DIAGNOSIS — N1832 Chronic kidney disease, stage 3b: Secondary | ICD-10-CM

## 2023-02-15 DIAGNOSIS — I255 Ischemic cardiomyopathy: Secondary | ICD-10-CM | POA: Diagnosis not present

## 2023-02-15 DIAGNOSIS — E1122 Type 2 diabetes mellitus with diabetic chronic kidney disease: Secondary | ICD-10-CM

## 2023-02-15 DIAGNOSIS — E785 Hyperlipidemia, unspecified: Secondary | ICD-10-CM | POA: Diagnosis not present

## 2023-02-15 DIAGNOSIS — I2510 Atherosclerotic heart disease of native coronary artery without angina pectoris: Secondary | ICD-10-CM | POA: Diagnosis not present

## 2023-02-15 MED ORDER — ASPIRIN 81 MG PO TBEC: 81.0000 mg | DELAYED_RELEASE_TABLET | Freq: Every day | ORAL | 3 refills | Status: DC

## 2023-02-15 MED ORDER — NITROGLYCERIN 0.4 MG SL SUBL: 0.4000 mg | SUBLINGUAL_TABLET | SUBLINGUAL | 1 refills | Status: DC | PRN

## 2023-02-15 MED ORDER — SACUBITRIL-VALSARTAN 24-26 MG PO TABS: 1.0000 | ORAL_TABLET | Freq: Two times a day (BID) | ORAL | 3 refills | Status: DC

## 2023-02-15 MED ORDER — DAPAGLIFLOZIN PROPANEDIOL 10 MG PO TABS: 10.0000 mg | ORAL_TABLET | Freq: Every day | ORAL | 3 refills | Status: DC

## 2023-02-15 MED ORDER — TICAGRELOR 90 MG PO TABS: 90.0000 mg | ORAL_TABLET | Freq: Two times a day (BID) | ORAL | 3 refills | Status: DC

## 2023-02-15 MED ORDER — ROSUVASTATIN CALCIUM 40 MG PO TABS: 40.0000 mg | ORAL_TABLET | Freq: Every day | ORAL | 3 refills | Status: DC

## 2023-02-15 MED ORDER — METOPROLOL SUCCINATE ER 25 MG PO TB24: 25.0000 mg | ORAL_TABLET | Freq: Every day | ORAL | 3 refills | Status: DC

## 2023-02-15 NOTE — Patient Instructions (Addendum)
Medication Instructions:  Stop Losartan as directed. Start Entresto 24/26 mg one tablet twice daily.  *If you need a refill on your cardiac medications before your next appointment, please call your pharmacy*   Lab Work: NONE ordered at this time of appointment   If you have labs (blood work) drawn today and your tests are completely normal, you will receive your results only by: [place] (if you have MyChart) OR A paper copy in the mail If you have any lab test that is abnormal or we need to change your treatment, we will call you to review the results.   Testing/Procedures: NONE ordered at this time of appointment     Follow-Up: At [facility], you and your health needs are our priority.  As part of our continuing mission to provide you with exceptional heart care, we have created designated Provider Care Teams.  These Care Teams include your primary Cardiologist (physician) and Advanced Practice Providers (APPs -  Physician Assistants and Nurse Practitioners) who all work together to provide you with the care you need, when you need it.  We recommend signing up for the patient portal called "MyChart".  Sign up information is provided on this After Visit Summary.  MyChart is used to connect with patients for Virtual Visits (Telemedicine).  Patients are able to view lab/test results, encounter notes, upcoming appointments, etc.  Non-urgent messages can be sent to your provider as well.   To learn more about what you can do with MyChart, go to [email or web address].    Your next appointment:   3 month(s)  Provider:   Diona Browner, NP        Other Instructions Complete lab work with your Primary Care Physician (Fasting Lipid panel & CMET)

## 2023-02-15 NOTE — Progress Notes (Signed)
Office Visit    Patient Name: Wanda Medina Date of Encounter: 02/15/2023  Primary Care Provider:  Leamon Arnt, MD Primary Cardiologist:  Quay Burow, MD  Chief Complaint    72 year old female with a history of CAD s/p STEMI, DES-LCx in 01/2023, ICM, hypertension, hyperlipidemia, type 2 diabetes, and GERD who presents for hospital follow-up related to CAD s/p STEMI.  Past Medical History    Past Medical History:  Diagnosis Date   Allergy    Arthritis    Chronic kidney disease    AKI saw Nephrologist in [place]   Diabetes mellitus without complication ([place])    GERD (gastroesophageal reflux disease)    Hyperlipemia    Hypertension    Past Surgical History:  Procedure Laterality Date   APPENDECTOMY     COLONOSCOPY     CORONARY/GRAFT ACUTE MI REVASCULARIZATION N/A 02/02/2023   Procedure: Coronary/Graft Acute MI Revascularization;  Surgeon: Lorretta Harp, MD;  Location: [facility];  Service: Cardiovascular;  Laterality: N/A;   LEFT HEART CATH AND CORONARY ANGIOGRAPHY N/A 02/02/2023   Procedure: LEFT HEART CATH AND CORONARY ANGIOGRAPHY;  Surgeon: Lorretta Harp, MD;  Location: [facility];  Service: Cardiovascular;  Laterality: N/A;   MEDIAL PARTIAL KNEE REPLACEMENT     Rt. knee in 05/2016   TRIGGER FINGER RELEASE     both hands    Allergies  Allergies  Allergen Reactions   Ace Inhibitors     cough   Altace [Ramipril] Cough   Lisinopril Cough     Labs/Other Studies Reviewed    The following studies were reviewed today: Cath: 02/02/2023     Prox Cx to Mid Cx lesion is 99% stenosed.   Mid LAD lesion is 60% stenosed.   A drug-eluting stent was successfully placed using a SYNERGY XD 3.0X16.   Post intervention, there is a 0% residual stenosis.   The left ventricular systolic function is normal.   LV end diastolic pressure is mildly elevated.   The left ventricular ejection fraction is 50-55% by visual estimate.    IMPRESSION: Successful PCI drug-eluting stenting of proximal to mid AV groove circumflex and a dominant vessel with inferolateral ST segment elevation.  The door to balloon time was 25 minutes.  The LVEDP was measured at 22 mmHg.  The patient did have normal LV function.  She had 70% segmental calcified mid LAD stenosis after the second diagonal branch.  She will need 12 months of uninterrupted DAPT along with high-dose statin therapy, low-dose beta-blocker, SGLT2.  I anticipate early discharge within 48 hours (fast-track).  The sheath was sewn securely in place.  The patient left lab in stable condition.  Echo: 02/02/2023   IMPRESSIONS    1. There is severe hypokinesis of the entire inferolateral LV wall and  the mid-to-apical inferior LV wall. Left ventricular ejection fraction, by  estimation, is 45 to 50%. The left ventricle has mildly decreased  function. The left ventricle demonstrates   regional wall motion abnormalities (see scoring diagram/findings for  description). There is mild left ventricular hypertrophy of the  basal-septal segment. Left ventricular diastolic parameters are consistent  with Grade I diastolic dysfunction (impaired  relaxation).   2. Right ventricular systolic function is normal. The right ventricular  size is normal. Tricuspid regurgitation signal is inadequate for assessing  PA pressure.   3. The mitral valve is grossly normal. Mild mitral valve regurgitation.   4. The aortic valve is tricuspid. There is mild calcification  of the  aortic valve. There is mild thickening of the aortic valve. Aortic valve  regurgitation is trivial. Aortic valve sclerosis/calcification is present,  without any evidence of aortic  S/p tests,.   Comparison(s): No prior Echocardiogram.   Recent Labs: 02/02/2023: ALT 49; B Natriuretic Peptide 78.8; TSH 4.991 02/03/2023: BUN 19; Creatinine, Ser 1.33; Hemoglobin 12.6; Platelets 249; Potassium 4.0; Sodium 133  Recent Lipid Panel     Component Value Date/Time   CHOL 111 02/05/2023 0224   TRIG 78 02/05/2023 0224   HDL 52 02/05/2023 0224   CHOLHDL 2.1 02/05/2023 0224   VLDL 16 02/05/2023 0224   LDLCALC 43 02/05/2023 0224    History of Present Illness    72 year old female with the above past medical history including CAD s/p STEMI, DES-LCx in 01/2023, ICM, hypertension, hyperlipidemia, type 2 diabetes, and GERD.  She is a strong family history of CAD.  She presented to the ED on 02/02/2023 with chest pain.  EKG in the field showed inferolateral STEMI.  She underwent emergent cardiac catheterization which revealed 99% proximal to mid left circumflex stenosis, s/p DES, 60% mid LAD stenosis, EF 50 to 55% by visual estimate.  Echocardiogram showed EF 45 to 50%, mildly decreased LV function, mild LVH with basal septal segment, G1 DD, normal RV function, mild mitral valve regurgitation, aortic sclerosis without evidence of stenosis.  She was discharged home in stable condition on 02/05/2023 on aspirin, Brilinta, metoprolol, losartan, Farxiga, and Crestor.  She presents today for follow-up accompanied by her daughter. Since her hospitalization she has well from a cardiac standpoint.  She denies any symptoms concerning for angina.  Denies dyspnea, edema, PND, orthopnea, weight gain.  BP has been stable.  She is working on gradually increasing her activity.  Overall, she reports feeling well.  Home Medications    Current Outpatient Medications  Medication Sig Dispense Refill   calcium-vitamin D (OSCAL WITH D) 500-200 MG-UNIT tablet Take 1 tablet by mouth daily.     cyclobenzaprine (FLEXERIL) 5 MG tablet Take 1 tablet (5 mg total) by mouth 2 (two) times daily as needed for muscle spasms. 180 tablet 3   ferrous sulfate 324 (65 Fe) MG TBEC Take 324 mg by mouth daily.     fluticasone (FLONASE) 50 MCG/ACT nasal spray Place 2 sprays into both nostrils daily. (Patient taking differently: Place 2 sprays into both nostrils daily as needed for  allergies or rhinitis.) 16 g 6   gabapentin (NEURONTIN) 100 MG capsule Take 1 capsule (100 mg total) by mouth 2 (two) times daily. 180 capsule 3   glucose blood (ONETOUCH ULTRA) test strip TEST BLOOD SUGARS TWICE DAILY AS NEEDED. 100 strip 3   Lancets (BD LANCET ULTRAFINE 99991111) MISC 1 application by Does not apply route in the morning and at bedtime. 360 each 3   montelukast (SINGULAIR) 10 MG tablet TAKE 1 TABLET BY MOUTH EVERYDAY AT BEDTIME (Patient taking differently: Take 10 mg by mouth at bedtime.) 90 tablet 0   omeprazole (PRILOSEC) 20 MG capsule Take 1 capsule (20 mg total) by mouth daily. 90 capsule 3   pseudoephedrine (SUDAFED 12 HOUR) 120 MG 12 hr tablet Take 1 tablet (120 mg total) by mouth 2 (two) times daily. (Patient taking differently: Take 120 mg by mouth 2 (two) times daily as needed for congestion.) 20 tablet 0   sacubitril-valsartan (ENTRESTO) 24-26 MG Take 1 tablet by mouth 2 (two) times daily. 60 tablet 3   Saline (SIMPLY SALINE) 0.9 % AERS Place 2  each into the nose daily as needed. (Patient taking differently: Place 2 each into the nose daily as needed (Rhinitis).) [id number] mL 1   TRULICITY A999333 0000000 SOPN Inject 1.5 mg into the skin once a week. 4 mL 3   zinc gluconate 50 MG tablet Take 50 mg by mouth daily.     aspirin EC 81 MG tablet Take 1 tablet (81 mg total) by mouth daily. Swallow whole. 90 tablet 3   dapagliflozin propanediol (FARXIGA) 10 MG TABS tablet Take 1 tablet (10 mg total) by mouth daily before breakfast. 90 tablet 3   metoprolol succinate (TOPROL-XL) 25 MG 24 hr tablet Take 1 tablet (25 mg total) by mouth daily. 90 tablet 3   nitroGLYCERIN (NITROSTAT) 0.4 MG SL tablet Place 1 tablet (0.4 mg total) under the tongue every 5 (five) minutes x 3 doses as needed for chest pain. 25 tablet 1   rosuvastatin (CRESTOR) 40 MG tablet Take 1 tablet (40 mg total) by mouth daily. 90 tablet 3   ticagrelor (BRILINTA) 90 MG TABS tablet Take 1 tablet (90 mg total) by mouth 2 (two)  times daily. 180 tablet 3   No current facility-administered medications for this visit.     Review of Systems    She denies chest pain, palpitations, dyspnea, pnd, orthopnea, n, v, dizziness, syncope, edema, weight gain, or early satiety. All other systems reviewed and are otherwise negative except as noted above.    Cardiac Rehabilitation Eligibility Assessment  The patient is ready to start cardiac rehabilitation from a cardiac standpoint.    Physical Exam    VS:  BP 122/78   Pulse 93   Ht '5\' 1"'$  (1.549 m)   Wt 119 lb 6.4 oz (54.2 kg)   LMP  (LMP Unknown)   SpO2 97%   BMI 22.56 kg/m   GEN: Well nourished, well developed, in no acute distress. HEENT: normal. Neck: Supple, no JVD, carotid bruits, or masses. Cardiac: RRR, no murmurs, rubs, or gallops. No clubbing, cyanosis, edema.  Radials/DP/PT 2+ and equal bilaterally.  Right femoral cath site with minimal bruising, no bleeding, bruit, or hematoma. Respiratory:  Respirations regular and unlabored, clear to auscultation bilaterally. GI: Soft, nontender, nondistended, BS + x 4. MS: no deformity or atrophy. Skin: warm and dry, no rash. Neuro:  Strength and sensation are intact. Psych: Normal affect.  Accessory Clinical Findings    ECG personally reviewed by me today -NSR, 93 bpm, LAD, inferolateral infarct- no acute changes.   Lab Results  Component Value Date   WBC 7.2 02/03/2023   HGB 12.6 02/03/2023   HCT 38.8 02/03/2023   MCV 93.3 02/03/2023   PLT 249 02/03/2023   Lab Results  Component Value Date   CREATININE 1.33 (H) 02/03/2023   BUN 19 02/03/2023   NA 133 (L) 02/03/2023   K 4.0 02/03/2023   CL 104 02/03/2023   CO2 20 (L) 02/03/2023   Lab Results  Component Value Date   ALT 49 (H) 02/02/2023   AST 373 (H) 02/02/2023   ALKPHOS 64 02/02/2023   BILITOT 0.9 02/02/2023   Lab Results  Component Value Date   CHOL 111 02/05/2023   HDL 52 02/05/2023   LDLCALC 43 02/05/2023   TRIG 78 02/05/2023   CHOLHDL  2.1 02/05/2023    Lab Results  Component Value Date   HGBA1C 6.9 (H) 02/02/2023    Assessment & Plan    1. CAD: S/p STEMI, DES-LCx in 01/2023. Stable with no anginal symptoms. No  indication for ischemic evaluation.  Continue aspirin, Brilinta, metoprolol, Entresto as below, [place], and Crestor.  2. ICM: Echo showed EF 45 to 50%, mildly decreased LV function, mild LVH with basal septal segment, G1 DD, normal RV function, mild mitral valve regurgitation, aortic sclerosis without evidence of stenosis.  Euvolemic and well compensated on exam.  BP has been stable.  Will switch losartan to Entresto 24-26 mg twice daily.  Continue to monitor BP as below.  Plan for repeat echo in 3 months, otherwise, continue current medications as above.  3. Hypertension: BP well controlled. Continue current antihypertensive regimen with above changes. Continue to monitor BP with transition to [facility] and report SBP consistently < 100, symptoms of dizziness, presyncope or syncope.    4. Hyperlipidemia: LDL was 43 in 01/2023.  She will have fasting lipid panel, CMET checked in 6 to 8 weeks with her PCP.  Continue Crestor.  5. Type 2 diabetes: A1c was 6.9 in 01/2023.  Monitored and managed per PCP.  6. Disposition: Follow-up in 3 months.     Lenna Sciara, NP 02/15/2023, 3:53 PM

## 2023-02-19 ENCOUNTER — Encounter: Payer: Self-pay | Admitting: Family Medicine

## 2023-02-19 ENCOUNTER — Ambulatory Visit (INDEPENDENT_AMBULATORY_CARE_PROVIDER_SITE_OTHER): Payer: BC Managed Care – PPO | Admitting: Family Medicine

## 2023-02-19 ENCOUNTER — Other Ambulatory Visit: Payer: Self-pay | Admitting: Family Medicine

## 2023-02-19 VITALS — BP 110/64 | HR 68 | Temp 97.7°F | Ht 61.0 in | Wt 115.8 lb

## 2023-02-19 DIAGNOSIS — N1832 Chronic kidney disease, stage 3b: Secondary | ICD-10-CM

## 2023-02-19 DIAGNOSIS — E1159 Type 2 diabetes mellitus with other circulatory complications: Secondary | ICD-10-CM | POA: Diagnosis not present

## 2023-02-19 DIAGNOSIS — I213 ST elevation (STEMI) myocardial infarction of unspecified site: Secondary | ICD-10-CM

## 2023-02-19 DIAGNOSIS — E1122 Type 2 diabetes mellitus with diabetic chronic kidney disease: Secondary | ICD-10-CM | POA: Diagnosis not present

## 2023-02-19 DIAGNOSIS — I152 Hypertension secondary to endocrine disorders: Secondary | ICD-10-CM

## 2023-02-19 DIAGNOSIS — I5021 Acute systolic (congestive) heart failure: Secondary | ICD-10-CM

## 2023-02-19 MED ORDER — TRULICITY 0.75 MG/0.5ML ~~LOC~~ SOAJ: 0.7500 mg | SUBCUTANEOUS | 5 refills | Status: DC

## 2023-02-19 MED ORDER — TRULICITY 3 MG/0.5ML ~~LOC~~ SOAJ: 3.0000 mg | SUBCUTANEOUS | 5 refills | Status: DC

## 2023-02-19 NOTE — Patient Instructions (Signed)
Please return in 3 months for recheck DM   I have printed Trulicity at A999333 mg weekly and 3.0 mg weekly.  Monitor your sugars and we will refill the dose that is needed.   If you have any questions or concerns, please don't hesitate to send me a message via MyChart or call the office at [phone]. Thank you for visiting with [place] today! It's our pleasure caring for you.

## 2023-02-19 NOTE — Progress Notes (Signed)
Subjective  CC:  Chief Complaint  Patient presents with   Hospitalization Follow-up    Acute ST elevation myocardial infarction  (02/02/2023 - 02/05/2023 (3 days) [place]      HPI: Wanda Medina is a 72 y.o. female who presents to the office today for follow up of diabetes and problems listed above in the chief complaint.  Hospitalized as noted above for acute myocardial infarction.  Patient had acute onset of symptoms the day prior to admission.  I reviewed hospital course and cardiology follow-up from last week.  Fortunately she has done well.  Minor systolic heart failure present.  She is euvolemic.  No longer having anginal symptoms.  Tolerating her medications well.  No hypotensive symptoms.  Walking 10,000 steps per day cleared by cardiology.  Will need follow-up echocardiogram in 3 months. Diabetes follow up: Her diabetic control is reported as Unchanged.  However, her appetite is decreased due to her decrease in activity level.  Sugars remain stable with fastings in the 90s.  She denies exertional CP or SOB or symptomatic hypoglycemia. She denies foot sores or paresthesias.  Trulicity is on backorder.  She is currently taking 1.5 mg weekly but she may not be able to get this dose in the near future. Wt Readings from Last 3 Encounters:  02/19/23 115 lb 12.8 oz (52.5 kg)  02/15/23 119 lb 6.4 oz (54.2 kg)  02/02/23 114 lb (51.7 kg)    BP Readings from Last 3 Encounters:  02/19/23 110/64  02/15/23 122/78  02/05/23 103/60    Assessment  1. ST elevation myocardial infarction (STEMI), unspecified artery ([place])   2. Acute systolic heart failure ([place])   3. Hypertension associated with diabetes ([place])   4. Type 2 diabetes mellitus with stage 3b chronic kidney disease, without long-term current use of insulin ([place])      Plan  Diabetes is currently well controlled.  We asked management strategies: She will continue to monitor fastings.  If her  fastings stay in the 90s, we will start 0.75 mg weekly of Trulicity.  If her fastings are trending upward she can go up to 3 mg weekly.  She will let me know if she has any problems getting her next dose.  Continue diabetic diet and routine walking exercises. Heart attack with mild heart failure: Currently stable on cardiac medications as listed in her chart. Monitoring renal function.  Follow up: 3 months for recheck. No orders of the defined types were placed in this encounter.  Meds ordered this encounter  Medications   TRULICITY [id number] 0000000 SOPN    Sig: Inject 0.75 mg into the skin once a week.    Dispense:  2 mL    Refill:  5   Dulaglutide (TRULICITY) 3 0000000 SOPN    Sig: Inject 3 mg as directed once a week.    Dispense:  2 mL    Refill:  5      Immunization History  Administered Date(s) Administered   Fluad Quad(high Dose 65+) 10/11/2021   Influenza, High Dose Seasonal PF 12/18/2018   Influenza-Unspecified 11/20/2019, 09/18/2020   PFIZER(Purple Top)SARS-COV-2 Vaccination 01/23/2020, 02/17/2020, 09/18/2020, 07/16/2021   Pfizer Covid-19 Vaccine Bivalent Booster 53yr & up 11/04/2021   Pneumococcal Conjugate-13 09/09/2016   Pneumococcal Polysaccharide-23 10/27/2018   Zoster Recombinat (Shingrix) 08/24/2019, 11/20/2019    Diabetes Related Lab Review: Lab Results  Component Value Date   HGBA1C 6.9 (H) 02/02/2023   HGBA1C 7.1 (A) 11/19/2022   HGBA1C 8.5 (  H) 08/22/2022    Lab Results  Component Value Date   MICROALBUR 4.0 (H) 08/22/2022   Lab Results  Component Value Date   CREATININE 1.33 (H) 02/03/2023   BUN 19 02/03/2023   NA 133 (L) 02/03/2023   K 4.0 02/03/2023   CL 104 02/03/2023   CO2 20 (L) 02/03/2023   Lab Results  Component Value Date   CHOL 111 02/05/2023   CHOL 161 08/22/2022   CHOL 146 08/01/2021   Lab Results  Component Value Date   HDL 52 02/05/2023   HDL 77.90 08/22/2022   HDL 63.00 08/01/2021   Lab Results  Component Value Date    LDLCALC 43 02/05/2023   LDLCALC 70 08/22/2022   LDLCALC 66 08/01/2021   Lab Results  Component Value Date   TRIG 78 02/05/2023   TRIG 69.0 08/22/2022   TRIG 85.0 08/01/2021   Lab Results  Component Value Date   CHOLHDL 2.1 02/05/2023   CHOLHDL 2 08/22/2022   CHOLHDL 2 08/01/2021   No results found for: "LDLDIRECT" The ASCVD Risk score (Arnett DK, et al., 2019) failed to calculate for the following reasons:   The patient has a prior MI or stroke diagnosis I have reviewed the [place], [facility] Soc history. Patient Active Problem List   Diagnosis Date Noted   Diabetic peripheral neuropathy ([place]) 10/31/2021    Priority: High   Diabetic retinopathy of both eyes without macular edema associated with type 2 diabetes mellitus ([place]) 08/08/2021    Priority: High   Hyperlipidemia with target LDL less than 70 08/01/2021    Priority: High   Type 2 diabetes mellitus with diabetic chronic kidney disease ([place]) 05/18/2019    Priority: High    Baseline creatinine: 1.3-1.4    Hypertension associated with diabetes ([place]) 10/27/2018    Priority: High   Osteoarthritis of knee, unspecified 11/12/2018    Priority: Medium    Osteopenia 11/12/2018    Priority: Medium     Dexa: 04/2020. tscore of -1.3. FRAX: 1.2%/9.3%. repeat 3 years. 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     Cough Symptoms present since last Tuesday, had a low grade  fever (only that night), Took tylenol, Mucinex and Benadryl, Sudafed with some relief.     Fatigue Weakness.    - total 6 days - never been sick like this - had fever 1 night last week - denies myalgias, but lots of fatigue, and 1 night fever - still able to walk a mile this am without dyspnea - taking tylenol and benadryl mucinex and sudafed, they help some - lots of throat and nose discharge,some a little bloody, but no pus and no sore throat - most bothersome symptoms are cough productive  - denies any pus production        Social History: Patient  reports that she has never smoked. She has never used smokeless tobacco. She reports that she  does not drink alcohol and does not use drugs.  Review of Systems: Ophthalmic: negative for eye pain, loss of vision or double vision Cardiovascular: negative for chest pain Respiratory: negative for SOB or persistent cough Gastrointestinal: negative for abdominal pain Genitourinary: negative for dysuria or gross hematuria MSK: negative for foot lesions Neurologic: negative for weakness or gait disturbance  Objective  Vitals: BP 110/64   Pulse 68   Temp 97.7 F (36.5 C)   Ht '5\' 1"'$  (1.549 m)   Wt 115 lb 12.8 oz (52.5 kg)   LMP  (LMP Unknown)   SpO2 98%   BMI 21.88 kg/m  General: well appearing, no acute distress  Psych:  Alert and oriented, normal mood and affect HEENT:  Normocephalic, atraumatic, moist mucous membranes, supple neck  Cardiovascular:  Nl S1 and S2, RRR without murmur, gallop or rub. no edema Respiratory:  Good breath sounds bilaterally, CTAB with normal effort, no rales    Diabetic education: ongoing education regarding chronic disease management for diabetes was given today. We continue to reinforce the ABC's of diabetic management: A1c (<7 or 8 dependent upon patient), tight blood pressure control, and cholesterol management with goal LDL < 100 minimally. We discuss diet strategies, exercise recommendations,  medication options and possible side effects. At each visit, we review recommended immunizations and preventive care recommendations for diabetics and stress that good diabetic control can prevent other problems. See below for this patient's data.   Commons side effects, risks, benefits, and alternatives for medications and treatment plan prescribed today were discussed, and the patient expressed understanding of the given instructions. Patient is instructed to call or message via MyChart if he/she has any questions or concerns regarding our treatment plan. No barriers to understanding were identified. We discussed Red Flag symptoms and signs in detail. Patient expressed understanding regarding what to do in case of urgent or emergency type symptoms.  Medication list was reconciled, printed and provided to the patient in AVS. Patient instructions and summary information was reviewed with the patient as documented in the AVS. This note was prepared with assistance of Dragon voice recognition software. Occasional wrong-word or sound-a-like substitutions may have occurred due to the inherent limitations of voice recognition software

## 2023-02-27 ENCOUNTER — Encounter (HOSPITAL_COMMUNITY): Payer: Self-pay

## 2023-02-27 ENCOUNTER — Telehealth (HOSPITAL_COMMUNITY): Payer: Self-pay

## 2023-02-27 NOTE — Telephone Encounter (Signed)
Attempted to call patient in regards to Cardiac Rehab - LM on VM Mailed letter 

## 2023-03-06 ENCOUNTER — Telehealth (HOSPITAL_COMMUNITY): Payer: Self-pay

## 2023-03-06 NOTE — Telephone Encounter (Signed)
Pt returned CR phone call and stated she is interested in CR. Pt will come in for orientation on 03/07/23 @ 10:30AM and will attend the 10:15AM class.

## 2023-03-06 NOTE — Telephone Encounter (Signed)
Called pt to confirm appt for 3/28 at 1030. Gave details on where to find, appt information, eating breakfast/diabetic guidelines, and clothing requirements. H/hx completed. Pt voiced understanding, all questions answered.    Colbert Ewing, MS 03/06/2023 5:05 PM

## 2023-03-07 ENCOUNTER — Encounter (HOSPITAL_COMMUNITY)
Admission: RE | Admit: 2023-03-07 | Discharge: 2023-03-07 | Disposition: A | Discharge status: Home / Self Care | Payer: BC Managed Care – PPO | Source: Ambulatory Visit | Attending: Cardiovascular Disease | Admitting: Cardiovascular Disease

## 2023-03-07 VITALS — BP 108/74 | HR 88 | Ht 61.0 in | Wt 115.7 lb

## 2023-03-07 DIAGNOSIS — Z955 Presence of coronary angioplasty implant and graft: Secondary | ICD-10-CM | POA: Diagnosis present

## 2023-03-07 DIAGNOSIS — I252 Old myocardial infarction: Secondary | ICD-10-CM | POA: Diagnosis not present

## 2023-03-07 DIAGNOSIS — I2121 ST elevation (STEMI) myocardial infarction involving left circumflex coronary artery: Secondary | ICD-10-CM

## 2023-03-07 DIAGNOSIS — Z48812 Encounter for surgical aftercare following surgery on the circulatory system: Secondary | ICD-10-CM | POA: Insufficient documentation

## 2023-03-07 NOTE — Progress Notes (Signed)
Cardiac Rehab Medication Review   Does the patient  feel that his/her medications are working for him/her?  yes  Has the patient been experiencing any side effects to the medications prescribed?  no  Does the patient measure his/her own blood pressure or blood glucose at home?  yes   Does the patient have any problems obtaining medications due to transportation or finances?   no  Understanding of regimen: excellent Understanding of indications: excellent Potential of compliance: excellent    Comments: No issues with medication compliance.    Wanda Medina 03/07/2023 1:32 PM

## 2023-03-07 NOTE — Progress Notes (Signed)
Cardiac Individual Treatment Plan  Patient Details  Name: Wanda Medina MRN: [id number] Date of Birth: Dec 25, 1950 Referring Provider:   [place] from 03/07/2023 in [facility] for Heart, Vascular, & [place]  Referring Provider Dr. 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 Swallow whole., Disp: 90 tablet, Rfl: 3   calcium-vitamin D (OSCAL WITH D) 500-200 MG-UNIT tablet, Take 1 tablet by mouth daily., Disp: , Rfl:    cyclobenzaprine (FLEXERIL) 5 MG tablet, Take 1 tablet (5 mg total) by mouth 2 (two) times daily as needed for muscle spasms., Disp: 180 tablet, Rfl: 3   dapagliflozin propanediol (FARXIGA) 10 MG TABS tablet, Take 1 tablet (10 mg total) by mouth daily before breakfast., Disp: 90 tablet, Rfl: 3   Dulaglutide (TRULICITY) 3 0000000 SOPN, Inject 3 mg as directed once a week., Disp: 2 mL, Rfl: 5   ferrous sulfate 324 (65 Fe) MG TBEC, Take 324 mg by mouth daily., Disp: , Rfl:    fluticasone (FLONASE) 50 MCG/ACT nasal spray, Place 2 sprays into both nostrils daily. (Patient taking differently: Place 2 sprays into both nostrils daily as needed for allergies or rhinitis.), Disp: 16 g, Rfl: 6   gabapentin (NEURONTIN) 100 MG capsule, Take 1 capsule (100 mg total) by mouth 2 (two) times daily., Disp: 180 capsule, Rfl: 3   Lancets (BD LANCET ULTRAFINE 99991111) MISC, 1 application by Does not apply route in the morning and at bedtime., Disp: 360  each, Rfl: 3   metoprolol succinate (TOPROL-XL) 25 MG 24 hr tablet, Take 1 tablet (25 mg total) by mouth daily., Disp: 90 tablet, Rfl: 3   montelukast (SINGULAIR) 10 MG tablet, TAKE 1 TABLET BY MOUTH EVERYDAY AT BEDTIME (Patient taking differently: Take 10 mg by mouth at bedtime.), Disp: 90 tablet, Rfl: 0   nitroGLYCERIN (NITROSTAT) 0.4 MG SL tablet, Place 1 tablet (0.4 mg total) under the tongue every 5 (five) minutes x 3 doses as needed for chest pain., Disp: 25 tablet, Rfl: 1   omeprazole (PRILOSEC) 20 MG capsule, Take 1 capsule (20 mg total) by mouth daily., Disp: 90 capsule, Rfl: 3   ONETOUCH ULTRA test strip, TEST BLOOD SUGARS TWICE DAILY AS NEEDED., Disp: 100 strip, Rfl: 3   rosuvastatin (CRESTOR) 40 MG tablet, Take 1 tablet (40 mg total) by mouth daily., Disp: 90 tablet, Rfl: 3   sacubitril-valsartan (ENTRESTO) 24-26 MG, Take 1 tablet by mouth 2 (two) times daily., Disp: 60 tablet, Rfl: 3   ticagrelor (BRILINTA) 90 MG TABS tablet, Take 1 tablet (90 mg total) by mouth 2 (two) times daily., Disp: 180 tablet, Rfl: 3   TRULICITY [id number] 0000000 SOPN, Inject 0.75 mg into the skin once a week., Disp: 2 mL, Rfl: 5   zinc gluconate 50 MG tablet, Take 50 mg by mouth daily., Disp: , Rfl:    pseudoephedrine (SUDAFED 12 HOUR) 120 MG 12 hr tablet, Take 1 tablet (120 mg total) by mouth 2 (two) times daily. (Patient taking differently:  Take 120 mg by mouth 2 (two) times daily as needed for congestion.), Disp: 20 tablet, Rfl: 0   Saline (SIMPLY SALINE) 0.9 % AERS, Place 2 each into the nose daily as needed. (Patient not taking: Reported on 03/07/2023), Disp: 500 mL, Rfl: 1  Past Medical History: Past Medical History:  Diagnosis Date   Allergy    Arthritis    Chronic kidney disease    AKI saw Nephrologist in [place]   Diabetes mellitus without complication ([place])    GERD (gastroesophageal reflux disease)    Hyperlipemia    Hypertension     Tobacco Use: Social History   Tobacco Use  Smoking Status  Never  Smokeless Tobacco Never    Labs: Review Flowsheet  More data exists      Latest Ref Rng & Units 08/15/2022 08/22/2022 11/19/2022 02/02/2023 02/05/2023  Labs for ITP Cardiac and Pulmonary Rehab  Cholestrol 0 - 200 mg/dL - 161  - - 111   LDL (calc) 0 - 99 mg/dL - 70  - - 43   HDL-C >40 mg/dL - 77.90  - - 52   Trlycerides <150 mg/dL - 69.0  - - 78   Hemoglobin A1c 4.8 - 5.6 % 7.9  8.5  7.1  6.9  -  TCO2 22 - 32 mmol/L - - - 22  -    Capillary Blood Glucose: Lab Results  Component Value Date   GLUCAP 171 (H) 02/05/2023   GLUCAP 127 (H) 02/05/2023   GLUCAP 175 (H) 02/04/2023   GLUCAP 179 (H) 02/04/2023   GLUCAP 105 (H) 02/04/2023     Exercise Target Goals: Exercise Program Goal: Individual exercise prescription set using results from initial 6 min walk test and THRR while considering  patient's activity barriers and safety.   Exercise Prescription Goal: Initial exercise prescription builds to 30-45 minutes a day of aerobic activity, 2-3 days per week.  Home exercise guidelines will be given to patient during program as part of exercise prescription that the participant will acknowledge.  Activity Barriers & Risk Stratification:  Activity Barriers & Cardiac Risk Stratification - 03/07/23 1234       Activity Barriers & Cardiac Risk Stratification   Activity Barriers Right Knee Replacement;History of Falls    Cardiac Risk Stratification High             6 Minute Walk:  6 Minute Walk     Row Name 03/07/23 1232         6 Minute Walk   Phase Initial     Distance 1360 feet     Walk Time 6 minutes     # of Rest Breaks 0     MPH 2.58     METS 3.03     RPE 9     Perceived Dyspnea  0     VO2 Peak 10.62     Symptoms No     Resting HR 88 bpm     Resting BP 108/74     Resting Oxygen Saturation  100 %     Exercise Oxygen Saturation  during 6 min walk 100 %     Max Ex. HR 88 bpm     Max Ex. BP 122/72     2 Minute Post BP 108/74              Oxygen  Initial Assessment:   Oxygen Re-Evaluation:   Oxygen Discharge (Final Oxygen Re-Evaluation):   Initial Exercise Prescription:  Initial Exercise Prescription - 03/07/23 1200  Date of Initial Exercise RX and Referring Provider   Date 03/07/23    Referring Provider Dr. Quay Burow MD    Expected Discharge Date 05/17/23      Treadmill   MPH 2.3    Grade 0    Minutes 15    METs 2.76      NuStep   Level 2    SPM 80    Minutes 15    METs 2.2      Prescription Details   Frequency (times per week) 3    Duration Progress to 30 minutes of continuous aerobic without signs/symptoms of physical distress      Intensity   THRR 40-80% of Max Heartrate 59-118    Ratings of Perceived Exertion 11-13    Perceived Dyspnea 0-4      Progression   Progression Continue progressive overload as per policy without signs/symptoms or physical distress.      Resistance Training   Training Prescription Yes    Weight 2    Reps 10-15             Perform Capillary Blood Glucose checks as needed.  Exercise Prescription Changes:   Exercise Comments:   Exercise Goals and Review:   Exercise Goals     Row Name 03/07/23 1237             Exercise Goals   Increase Physical Activity Yes       Intervention Provide advice, education, support and counseling about physical activity/exercise needs.;Develop an individualized exercise prescription for aerobic and resistive training based on initial evaluation findings, risk stratification, comorbidities and participant's personal goals.       Expected Outcomes Short Term: Attend rehab on a regular basis to increase amount of physical activity.;Long Term: Exercising regularly at least 3-5 days a week.;Long Term: Add in home exercise to make exercise part of routine and to increase amount of physical activity.       Increase Strength and Stamina Yes       Intervention Provide advice, education, support and counseling about physical  activity/exercise needs.;Develop an individualized exercise prescription for aerobic and resistive training based on initial evaluation findings, risk stratification, comorbidities and participant's personal goals.       Expected Outcomes Short Term: Increase workloads from initial exercise prescription for resistance, speed, and METs.;Short Term: Perform resistance training exercises routinely during rehab and add in resistance training at home;Long Term: Improve cardiorespiratory fitness, muscular endurance and strength as measured by increased METs and functional capacity (6MWT)       Able to understand and use rate of perceived exertion (RPE) scale Yes       Intervention Provide education and explanation on how to use RPE scale       Expected Outcomes Short Term: Able to use RPE daily in rehab to express subjective intensity level;Long Term:  Able to use RPE to guide intensity level when exercising independently       Knowledge and understanding of Target Heart Rate Range (THRR) Yes       Intervention Provide education and explanation of THRR including how the numbers were predicted and where they are located for reference       Expected Outcomes Short Term: Able to state/look up THRR;Long Term: Able to use THRR to govern intensity when exercising independently;Short Term: Able to use daily as guideline for intensity in rehab       Understanding of Exercise Prescription Yes       Intervention  Provide education, explanation, and written materials on patient's individual exercise prescription       Expected Outcomes Short Term: Able to explain program exercise prescription;Long Term: Able to explain home exercise prescription to exercise independently                Exercise Goals Re-Evaluation :   Discharge Exercise Prescription (Final Exercise Prescription Changes):   Nutrition:  Target Goals: Understanding of nutrition guidelines, daily intake of sodium 1500mg , cholesterol 200mg ,  calories 30% from fat and 7% or less from saturated fats, daily to have 5 or more servings of fruits and vegetables.  Biometrics:  Pre Biometrics - 03/07/23 1237       Pre Biometrics   Waist Circumference 34.5 inches    Hip Circumference 39 inches    Waist to Hip Ratio 0.88 %    Triceps Skinfold 13 mm    % Body Fat 32.6 %    Grip Strength 16 kg    Flexibility 14.75 in    Single Leg Stand 19 seconds              Nutrition Therapy Plan and Nutrition Goals:   Nutrition Assessments:  MEDIFICTS Score Key: ?70 Need to make dietary changes  40-70 Heart Healthy Diet ? 40 Therapeutic Level Cholesterol Diet    Picture Your Plate Scores: [id number] Unhealthy dietary pattern with much room for improvement. 41-50 Dietary pattern unlikely to meet recommendations for good health and room for improvement. 51-60 More healthful dietary pattern, with some room for improvement.  >60 Healthy dietary pattern, although there may be some specific behaviors that could be improved.    Nutrition Goals Re-Evaluation:   Nutrition Goals Re-Evaluation:   Nutrition Goals Discharge (Final Nutrition Goals Re-Evaluation):   Psychosocial: Target Goals: Acknowledge presence or absence of significant depression and/or stress, maximize coping skills, provide positive support system. Participant is able to verbalize types and ability to use techniques and skills needed for reducing stress and depression.  Initial Review & Psychosocial Screening:  Initial Psych Review & Screening - 03/07/23 1156       Initial Review   Current issues with None Identified      Family Dynamics   Good Support System? Yes    Comments Pt has family at home for support      Barriers   Psychosocial barriers to participate in program There are no identifiable barriers or psychosocial needs.      Screening Interventions   Interventions Encouraged to exercise;Provide feedback about the scores to participant              Quality of Life Scores:  Quality of Life - 03/07/23 1241       Quality of Life   Select Quality of Life      Quality of Life Scores   Health/Function Pre 29.5 %    Socioeconomic Pre 30 %    Psych/Spiritual Pre 30 %    Family Pre 30 %    GLOBAL Pre 29.79 %            Scores of 19 and below usually indicate a poorer quality of life in these areas.  A difference of  2-3 points is a clinically meaningful difference.  A difference of 2-3 points in the total score of the Quality of Life Index has been associated with significant improvement in overall quality of life, self-image, physical symptoms, and general health in studies assessing change in quality of life.  PHQ-9: Review Flowsheet  More data exists      03/07/2023 02/19/2023 11/19/2022 09/19/2022 08/15/2022  Depression screen PHQ 2/9  Decreased Interest 0 0 0 0 0  Down, Depressed, Hopeless 0 0 0 0 0  PHQ - 2 Score 0 0 0 0 0  Altered sleeping 0 - - - -  Tired, decreased energy 0 - - - -  Change in appetite 0 - - - -  Feeling bad or failure about yourself  0 - - - -  Trouble concentrating 0 - - - -  Moving slowly or fidgety/restless 0 - - - -  Suicidal thoughts 0 - - - -  PHQ-9 Score 0 - - - -  Difficult doing work/chores Not difficult at all - - - -   Interpretation of Total Score  Total Score Depression Severity:  1-4 = Minimal depression, 5-9 = Mild depression, 10-14 = Moderate depression, 15-19 = Moderately severe depression, 20-27 = Severe depression   Psychosocial Evaluation and Intervention:   Psychosocial Re-Evaluation:   Psychosocial Discharge (Final Psychosocial Re-Evaluation):   Vocational Rehabilitation: Provide vocational rehab assistance to qualifying candidates.   Vocational Rehab Evaluation & Intervention:  Vocational Rehab - 03/07/23 1253       Initial Vocational Rehab Evaluation & Intervention   Assessment shows need for Vocational Rehabilitation No   Pt is retired and does not have a  need for vocational rehab            Education: Education Goals: Education classes will be provided on a weekly basis, covering required topics. Participant will state understanding/return demonstration of topics presented.     Core Videos: Exercise    Move It!  Clinical staff conducted group or individual video education with verbal and written material and guidebook.  Patient learns the recommended Pritikin exercise program. Exercise with the goal of living a long, healthy life. Some of the health benefits of exercise include controlled diabetes, healthier blood pressure levels, improved cholesterol levels, improved heart and lung capacity, improved sleep, and better body composition. Everyone should speak with their doctor before starting or changing an exercise routine.  Biomechanical Limitations Clinical staff conducted group or individual video education with verbal and written material and guidebook.  Patient learns how biomechanical limitations can impact exercise and how we can mitigate and possibly overcome limitations to have an impactful and balanced exercise routine.  Body Composition Clinical staff conducted group or individual video education with verbal and written material and guidebook.  Patient learns that body composition (ratio of muscle mass to fat mass) is a key component to assessing overall fitness, rather than body weight alone. Increased fat mass, especially visceral belly fat, can put [place] at increased risk for metabolic syndrome, type 2 diabetes, heart disease, and even death. It is recommended to combine diet and exercise (cardiovascular and resistance training) to improve your body composition. Seek guidance from your physician and exercise physiologist before implementing an exercise routine.  Exercise Action Plan Clinical staff conducted group or individual video education with verbal and written material and guidebook.  Patient learns the recommended  strategies to achieve and enjoy long-term exercise adherence, including variety, self-motivation, self-efficacy, and positive decision making. Benefits of exercise include fitness, good health, weight management, more energy, better sleep, less stress, and overall well-being.  Medical   Heart Disease Risk Reduction Clinical staff conducted group or individual video education with verbal and written material and guidebook.  Patient learns our heart is our most vital organ as it circulates oxygen, nutrients,  white blood cells, and hormones throughout the entire body, and carries waste away. Data supports a plant-based eating plan like the Pritikin Program for its effectiveness in slowing progression of and reversing heart disease. The video provides a number of recommendations to address heart disease.   Metabolic Syndrome and Belly Fat  Clinical staff conducted group or individual video education with verbal and written material and guidebook.  Patient learns what metabolic syndrome is, how it leads to heart disease, and how one can reverse it and keep it from coming back. You have metabolic syndrome if you have 3 of the following 5 criteria: abdominal obesity, high blood pressure, high triglycerides, low HDL cholesterol, and high blood sugar.  Hypertension and Heart Disease Clinical staff conducted group or individual video education with verbal and written material and guidebook.  Patient learns that high blood pressure, or hypertension, is very common in the [place]. Hypertension is largely due to excessive salt intake, but other important risk factors include being overweight, physical inactivity, drinking too much alcohol, smoking, and not eating enough potassium from fruits and vegetables. High blood pressure is a leading risk factor for heart attack, stroke, congestive heart failure, dementia, kidney failure, and premature death. Long-term effects of excessive salt intake include stiffening  of the arteries and thickening of heart muscle and organ damage. Recommendations include ways to reduce hypertension and the risk of heart disease.  Diseases of Our Time - Focusing on Diabetes Clinical staff conducted group or individual video education with verbal and written material and guidebook.  Patient learns why the best way to stop diseases of our time is prevention, through food and other lifestyle changes. Medicine (such as prescription pills and surgeries) is often only a Band-Aid on the problem, not a long-term solution. Most common diseases of our time include obesity, type 2 diabetes, hypertension, heart disease, and cancer. The Pritikin Program is recommended and has been proven to help reduce, reverse, and/or prevent the damaging effects of metabolic syndrome.  Nutrition   Overview of the Pritikin Eating Plan  Clinical staff conducted group or individual video education with verbal and written material and guidebook.  Patient learns about the [place] for disease risk reduction. The [place] emphasizes a wide variety of unrefined, minimally-processed carbohydrates, like fruits, vegetables, whole grains, and legumes. Go, Caution, and Stop food choices are explained. Plant-based and lean animal proteins are emphasized. Rationale provided for low sodium intake for blood pressure control, low added sugars for blood sugar stabilization, and low added fats and oils for coronary artery disease risk reduction and weight management.  Calorie Density  Clinical staff conducted group or individual video education with verbal and written material and guidebook.  Patient learns about calorie density and how it impacts the Pritikin Eating Plan. Knowing the characteristics of the food you choose will help you decide whether those foods will lead to weight gain or weight loss, and whether you want to consume more or less of them. Weight loss is usually a side effect of the  Pritikin Eating Plan because of its focus on low calorie-dense foods.  Label Reading  Clinical staff conducted group or individual video education with verbal and written material and guidebook.  Patient learns about the Pritikin recommended label reading guidelines and corresponding recommendations regarding calorie density, added sugars, sodium content, and whole grains.  Dining Out - Part 1  Clinical staff conducted group or individual video education with verbal and written material and guidebook.  Patient  learns that restaurant meals can be sabotaging because they can be so high in calories, fat, sodium, and/or sugar. Patient learns recommended strategies on how to positively address this and avoid unhealthy pitfalls.  Facts on Fats  Clinical staff conducted group or individual video education with verbal and written material and guidebook.  Patient learns that lifestyle modifications can be just as effective, if not more so, as many medications for lowering your risk of heart disease. A Pritikin lifestyle can help to reduce your risk of inflammation and atherosclerosis (cholesterol build-up, or plaque, in the artery walls). Lifestyle interventions such as dietary choices and physical activity address the cause of atherosclerosis. A review of the types of fats and their impact on blood cholesterol levels, along with dietary recommendations to reduce fat intake is also included.  Nutrition Action Plan  Clinical staff conducted group or individual video education with verbal and written material and guidebook.  Patient learns how to incorporate Pritikin recommendations into their lifestyle. Recommendations include planning and keeping personal health goals in mind as an important part of their success.  Healthy Mind-Set    Healthy Minds, Bodies, Hearts  Clinical staff conducted group or individual video education with verbal and written material and guidebook.  Patient learns how to identify  when they are stressed. Video will discuss the impact of that stress, as well as the many benefits of stress management. Patient will also be introduced to stress management techniques. The way we think, act, and feel has an impact on our hearts.  How Our Thoughts Can Heal Our Hearts  Clinical staff conducted group or individual video education with verbal and written material and guidebook.  Patient learns that negative thoughts can cause depression and anxiety. This can result in negative lifestyle behavior and serious health problems. Cognitive behavioral therapy is an effective method to help control our thoughts in order to change and improve our emotional outlook.  Additional Videos:  Exercise    Improving Performance  Clinical staff conducted group or individual video education with verbal and written material and guidebook.  Patient learns to use a non-linear approach by alternating intensity levels and lengths of time spent exercising to help burn more calories and lose more body fat. Cardiovascular exercise helps improve heart health, metabolism, hormonal balance, blood sugar control, and recovery from fatigue. Resistance training improves strength, endurance, balance, coordination, reaction time, metabolism, and muscle mass. Flexibility exercise improves circulation, posture, and balance. Seek guidance from your physician and exercise physiologist before implementing an exercise routine and learn your capabilities and proper form for all exercise.  Introduction to Yoga  Clinical staff conducted group or individual video education with verbal and written material and guidebook.  Patient learns about yoga, a discipline of the coming together of mind, breath, and body. The benefits of yoga include improved flexibility, improved range of motion, better posture and core strength, increased lung function, weight loss, and positive self-image. Yoga's heart health benefits include lowered blood  pressure, healthier heart rate, decreased cholesterol and triglyceride levels, improved immune function, and reduced stress. Seek guidance from your physician and exercise physiologist before implementing an exercise routine and learn your capabilities and proper form for all exercise.  Medical   Aging: Enhancing Your Quality of Life  Clinical staff conducted group or individual video education with verbal and written material and guidebook.  Patient learns key strategies and recommendations to stay in good physical health and enhance quality of life, such as prevention strategies, having an advocate, securing  a Health Care Proxy and Power of Attorney, and keeping a list of medications and system for tracking them. It also discusses how to avoid risk for bone loss.  Biology of Weight Control  Clinical staff conducted group or individual video education with verbal and written material and guidebook.  Patient learns that weight gain occurs because we consume more calories than we burn (eating more, moving less). Even if your body weight is normal, you may have higher ratios of fat compared to muscle mass. Too much body fat puts you at increased risk for cardiovascular disease, heart attack, stroke, type 2 diabetes, and obesity-related cancers. In addition to exercise, following the [place] can help reduce your risk.  Decoding Lab Results  Clinical staff conducted group or individual video education with verbal and written material and guidebook.  Patient learns that lab test reflects one measurement whose values change over time and are influenced by many factors, including medication, stress, sleep, exercise, food, hydration, pre-existing medical conditions, and more. It is recommended to use the knowledge from this video to become more involved with your lab results and evaluate your numbers to speak with your doctor.   Diseases of Our Time - Overview  Clinical staff conducted group or  individual video education with verbal and written material and guidebook.  Patient learns that according to the CDC, 50% to 70% of chronic diseases (such as obesity, type 2 diabetes, elevated lipids, hypertension, and heart disease) are avoidable through lifestyle improvements including healthier food choices, listening to satiety cues, and increased physical activity.  Sleep Disorders Clinical staff conducted group or individual video education with verbal and written material and guidebook.  Patient learns how good quality and duration of sleep are important to overall health and well-being. Patient also learns about sleep disorders and how they impact health along with recommendations to address them, including discussing with a physician.  Nutrition  Dining Out - Part 2 Clinical staff conducted group or individual video education with verbal and written material and guidebook.  Patient learns how to plan ahead and communicate in order to maximize their dining experience in a healthy and nutritious manner. Included are recommended food choices based on the type of restaurant the patient is visiting.   Fueling a Best boy conducted group or individual video education with verbal and written material and guidebook.  There is a strong connection between our food choices and our health. Diseases like obesity and type 2 diabetes are very prevalent and are in large-part due to lifestyle choices. The Pritikin Eating Plan provides plenty of food and hunger-curbing satisfaction. It is easy to follow, affordable, and helps reduce health risks.  Menu Workshop  Clinical staff conducted group or individual video education with verbal and written material and guidebook.  Patient learns that restaurant meals can sabotage health goals because they are often packed with calories, fat, sodium, and sugar. Recommendations include strategies to plan ahead and to communicate with the manager,  chef, or server to help order a healthier meal.  Planning Your Eating Strategy  Clinical staff conducted group or individual video education with verbal and written material and guidebook.  Patient learns about the [place] and its benefit of reducing the risk of disease. The [place] does not focus on calories. Instead, it emphasizes high-quality, nutrient-rich foods. By knowing the characteristics of the foods, we choose, we can determine their calorie density and make informed decisions.  Targeting Your Nutrition Priorities  Clinical staff conducted group or individual video education with verbal and written material and guidebook.  Patient learns that lifestyle habits have a tremendous impact on disease risk and progression. This video provides eating and physical activity recommendations based on your personal health goals, such as reducing LDL cholesterol, losing weight, preventing or controlling type 2 diabetes, and reducing high blood pressure.  Vitamins and Minerals  Clinical staff conducted group or individual video education with verbal and written material and guidebook.  Patient learns different ways to obtain key vitamins and minerals, including through a recommended healthy diet. It is important to discuss all supplements you take with your doctor.   Healthy Mind-Set    Smoking Cessation  Clinical staff conducted group or individual video education with verbal and written material and guidebook.  Patient learns that cigarette smoking and tobacco addiction pose a serious health risk which affects millions of people. Stopping smoking will significantly reduce the risk of heart disease, lung disease, and many forms of cancer. Recommended strategies for quitting are covered, including working with your doctor to develop a successful plan.  Culinary   Becoming a Financial trader conducted group or individual video education with verbal and written  material and guidebook.  Patient learns that cooking at home can be healthy, cost-effective, quick, and puts them in control. Keys to cooking healthy recipes will include looking at your recipe, assessing your equipment needs, planning ahead, making it simple, choosing cost-effective seasonal ingredients, and limiting the use of added fats, salts, and sugars.  Cooking - Breakfast and Snacks  Clinical staff conducted group or individual video education with verbal and written material and guidebook.  Patient learns how important breakfast is to satiety and nutrition through the entire day. Recommendations include key foods to eat during breakfast to help stabilize blood sugar levels and to prevent overeating at meals later in the day. Planning ahead is also a key component.  Cooking - Human resources officer conducted group or individual video education with verbal and written material and guidebook.  Patient learns eating strategies to improve overall health, including an approach to cook more at home. Recommendations include thinking of animal protein as a side on your plate rather than center stage and focusing instead on lower calorie dense options like vegetables, fruits, whole grains, and plant-based proteins, such as beans. Making sauces in large quantities to freeze for later and leaving the skin on your vegetables are also recommended to maximize your experience.  Cooking - Healthy Salads and Dressing Clinical staff conducted group or individual video education with verbal and written material and guidebook.  Patient learns that vegetables, fruits, whole grains, and legumes are the foundations of the [place]. Recommendations include how to incorporate each of these in flavorful and healthy salads, and how to create homemade salad dressings. Proper handling of ingredients is also covered. Cooking - Soups and [employer] - Soups and Desserts Clinical staff conducted  group or individual video education with verbal and written material and guidebook.  Patient learns that Pritikin soups and desserts make for easy, nutritious, and delicious snacks and meal components that are low in sodium, fat, sugar, and calorie density, while high in vitamins, minerals, and filling fiber. Recommendations include simple and healthy ideas for soups and desserts.   Overview     The Pritikin Solution Program Overview Clinical staff conducted group or individual video education with verbal and written material and guidebook.  Patient learns that  the results of the Pritikin Program have been documented in more than 100 articles published in peer-reviewed journals, and the benefits include reducing risk factors for (and, in some cases, even reversing) high cholesterol, high blood pressure, type 2 diabetes, obesity, and more! An overview of the three key pillars of the Pritikin Program will be covered: eating well, doing regular exercise, and having a healthy mind-set.  WORKSHOPS  Exercise: Exercise Basics: Building Your Action Plan Clinical staff led group instruction and group discussion with PowerPoint presentation and patient guidebook. To enhance the learning environment the use of posters, models and videos may be added. At the conclusion of this workshop, patients will comprehend the difference between physical activity and exercise, as well as the benefits of incorporating both, into their routine. Patients will understand the FITT (Frequency, Intensity, Time, and Type) principle and how to use it to build an exercise action plan. In addition, safety concerns and other considerations for exercise and cardiac rehab will be addressed by the presenter. The purpose of this lesson is to promote a comprehensive and effective weekly exercise routine in order to improve patients' overall level of fitness.   Managing Heart Disease: Your Path to a Healthier Heart Clinical staff led  group instruction and group discussion with PowerPoint presentation and patient guidebook. To enhance the learning environment the use of posters, models and videos may be added.At the conclusion of this workshop, patients will understand the anatomy and physiology of the heart. Additionally, they will understand how Pritikin's three pillars impact the risk factors, the progression, and the management of heart disease.  The purpose of this lesson is to provide a high-level overview of the heart, heart disease, and how the Pritikin lifestyle positively impacts risk factors.  Exercise Biomechanics Clinical staff led group instruction and group discussion with PowerPoint presentation and patient guidebook. To enhance the learning environment the use of posters, models and videos may be added. Patients will learn how the structural parts of their bodies function and how these functions impact their daily activities, movement, and exercise. Patients will learn how to promote a neutral spine, learn how to manage pain, and identify ways to improve their physical movement in order to promote healthy living. The purpose of this lesson is to expose patients to common physical limitations that impact physical activity. Participants will learn practical ways to adapt and manage aches and pains, and to minimize their effect on regular exercise. Patients will learn how to maintain good posture while sitting, walking, and lifting.  Balance Training and Fall Prevention  Clinical staff led group instruction and group discussion with PowerPoint presentation and patient guidebook. To enhance the learning environment the use of posters, models and videos may be added. At the conclusion of this workshop, patients will understand the importance of their sensorimotor skills (vision, proprioception, and the vestibular system) in maintaining their ability to balance as they age. Patients will apply a variety of balancing  exercises that are appropriate for their current level of function. Patients will understand the common causes for poor balance, possible solutions to these problems, and ways to modify their physical environment in order to minimize their fall risk. The purpose of this lesson is to teach patients about the importance of maintaining balance as they age and ways to minimize their risk of falling.  WORKSHOPS   Nutrition:  Fueling a Scientist, research (physical sciences) led group instruction and group discussion with PowerPoint presentation and patient guidebook. To enhance the learning environment the use  of posters, models and videos may be added. Patients will review the foundational principles of the [place] and understand what constitutes a serving size in each of the food groups. Patients will also learn Pritikin-friendly foods that are better choices when away from home and review make-ahead meal and snack options. Calorie density will be reviewed and applied to three nutrition priorities: weight maintenance, weight loss, and weight gain. The purpose of this lesson is to reinforce (in a group setting) the key concepts around what patients are recommended to eat and how to apply these guidelines when away from home by planning and selecting Pritikin-friendly options. Patients will understand how calorie density may be adjusted for different weight management goals.  Mindful Eating  Clinical staff led group instruction and group discussion with PowerPoint presentation and patient guidebook. To enhance the learning environment the use of posters, models and videos may be added. Patients will briefly review the concepts of the [place] and the importance of low-calorie dense foods. The concept of mindful eating will be introduced as well as the importance of paying attention to internal hunger signals. Triggers for non-hunger eating and techniques for dealing with triggers will be explored.  The purpose of this lesson is to provide patients with the opportunity to review the basic principles of the [place], discuss the value of eating mindfully and how to measure internal cues of hunger and fullness using the Hunger Scale. Patients will also discuss reasons for non-hunger eating and learn strategies to use for controlling emotional eating.  Targeting Your Nutrition Priorities Clinical staff led group instruction and group discussion with PowerPoint presentation and patient guidebook. To enhance the learning environment the use of posters, models and videos may be added. Patients will learn how to determine their genetic susceptibility to disease by reviewing their family history. Patients will gain insight into the importance of diet as part of an overall healthy lifestyle in mitigating the impact of genetics and other environmental insults. The purpose of this lesson is to provide patients with the opportunity to assess their personal nutrition priorities by looking at their family history, their own health history and current risk factors. Patients will also be able to discuss ways of prioritizing and modifying the [place] for their highest risk areas  Menu  Clinical staff led group instruction and group discussion with PowerPoint presentation and patient guidebook. To enhance the learning environment the use of posters, models and videos may be added. Using menus brought in from [employer], or printed from [employer], patients will apply the [place] dining out guidelines that were presented in the [employer] video. Patients will also be able to practice these guidelines in a variety of provided scenarios. The purpose of this lesson is to provide patients with the opportunity to practice hands-on learning of the [place] with actual menus and practice scenarios.  Label Reading Clinical staff led group instruction  and group discussion with PowerPoint presentation and patient guidebook. To enhance the learning environment the use of posters, models and videos may be added. Patients will review and discuss the Pritikin label reading guidelines presented in Pritikin's Label Reading Educational series video. Using fool labels brought in from local grocery stores and markets, patients will apply the label reading guidelines and determine if the packaged food meet the Pritikin guidelines. The purpose of this lesson is to provide patients with the opportunity to review, discuss, and practice hands-on learning of  the Pritikin Label Reading guidelines with actual packaged food labels. [place] Workshops are designed to teach patients ways to prepare quick, simple, and affordable recipes at home. The importance of nutrition's role in chronic disease risk reduction is reflected in its emphasis in the overall Pritikin program. By learning how to prepare essential core Pritikin Eating Plan recipes, patients will increase control over what they eat; be able to customize the flavor of foods without the use of added salt, sugar, or fat; and improve the quality of the food they consume. By learning a set of core recipes which are easily assembled, quickly prepared, and affordable, patients are more likely to prepare more healthy foods at home. These workshops focus on convenient breakfasts, simple entres, side dishes, and desserts which can be prepared with minimal effort and are consistent with nutrition recommendations for cardiovascular risk reduction. Cooking [employer] are taught by a Engineer, materials (RD) who has been trained by the [employer]. The chef or RD has a clear understanding of the importance of minimizing - if not completely eliminating - added fat, sugar, and sodium in recipes. Throughout the series of [place] Workshop sessions, patients will learn  about healthy ingredients and efficient methods of cooking to build confidence in their capability to prepare    Cooking School weekly topics:  Adding Flavor- Sodium-Free  Fast and Healthy Breakfasts  Powerhouse Plant-Based Proteins  Satisfying Salads and Dressings  Simple Sides and Sauces  International Cuisine-Spotlight on the [place] Zones  Delicious Desserts  Savory Soups  [employer] - Meals in a Agricultural consultant Appetizers and Snacks  Comforting Weekend Breakfasts  One-Pot Wonders   Fast Evening Meals  Contractor Your Pritikin Plate  WORKSHOPS   Healthy Mindset (Psychosocial):  Focused Goals, Sustainable Changes Clinical staff led group instruction and group discussion with PowerPoint presentation and patient guidebook. To enhance the learning environment the use of posters, models and videos may be added. Patients will be able to apply effective goal setting strategies to establish at least one personal goal, and then take consistent, meaningful action toward that goal. They will learn to identify common barriers to achieving personal goals and develop strategies to overcome them. Patients will also gain an understanding of how our mind-set can impact our ability to achieve goals and the importance of cultivating a positive and growth-oriented mind-set. The purpose of this lesson is to provide patients with a deeper understanding of how to set and achieve personal goals, as well as the tools and strategies needed to overcome common obstacles which may arise along the way.  From Head to Heart: The Power of a Healthy Outlook  Clinical staff led group instruction and group discussion with PowerPoint presentation and patient guidebook. To enhance the learning environment the use of posters, models and videos may be added. Patients will be able to recognize and describe the impact of emotions and mood on physical health. They will discover the importance of  self-care and explore self-care practices which may work for them. Patients will also learn how to utilize the 4 C's to cultivate a healthier outlook and better manage stress and challenges. The purpose of this lesson is to demonstrate to patients how a healthy outlook is an essential part of maintaining good health, especially as they continue their cardiac rehab journey.  Healthy Sleep for a Healthy Heart Clinical staff led group instruction and group discussion with PowerPoint presentation and patient  guidebook. To enhance the learning environment the use of posters, models and videos may be added. At the conclusion of this workshop, patients will be able to demonstrate knowledge of the importance of sleep to overall health, well-being, and quality of life. They will understand the symptoms of, and treatments for, common sleep disorders. Patients will also be able to identify daytime and nighttime behaviors which impact sleep, and they will be able to apply these tools to help manage sleep-related challenges. The purpose of this lesson is to provide patients with a general overview of sleep and outline the importance of quality sleep. Patients will learn about a few of the most common sleep disorders. Patients will also be introduced to the concept of "sleep hygiene," and discover ways to self-manage certain sleeping problems through simple daily behavior changes. Finally, the workshop will motivate patients by clarifying the links between quality sleep and their goals of heart-healthy living.   Recognizing and Reducing Stress Clinical staff led group instruction and group discussion with PowerPoint presentation and patient guidebook. To enhance the learning environment the use of posters, models and videos may be added. At the conclusion of this workshop, patients will be able to understand the types of stress reactions, differentiate between acute and chronic stress, and recognize the impact that chronic  stress has on their health. They will also be able to apply different coping mechanisms, such as reframing negative self-talk. Patients will have the opportunity to practice a variety of stress management techniques, such as deep abdominal breathing, progressive muscle relaxation, and/or guided imagery.  The purpose of this lesson is to educate patients on the role of stress in their lives and to provide healthy techniques for coping with it.  Learning Barriers/Preferences:  Learning Barriers/Preferences - 03/07/23 1241       Learning Barriers/Preferences   Learning Barriers Sight    Learning Preferences Pictoral;Written Material;Computer/Internet             Education Topics:  Knowledge Questionnaire Score:  Knowledge Questionnaire Score - 03/07/23 1243       Knowledge Questionnaire Score   Pre Score 18/24             Core Components/Risk Factors/Patient Goals at Admission:  Personal Goals and Risk Factors at Admission - 03/07/23 1244       Core Components/Risk Factors/Patient Goals on Admission    Weight Management Yes    Diabetes Yes    Intervention Provide education about signs/symptoms and action to take for hypo/hyperglycemia.;Provide education about proper nutrition, including hydration, and aerobic/resistive exercise prescription along with prescribed medications to achieve blood glucose in normal ranges: Fasting glucose 65-99 mg/dL    Expected Outcomes Short Term: Participant verbalizes understanding of the signs/symptoms and immediate care of hyper/hypoglycemia, proper foot care and importance of medication, aerobic/resistive exercise and nutrition plan for blood glucose control.;Long Term: Attainment of HbA1C < 7%.    Hypertension Yes    Intervention Provide education on lifestyle modifcations including regular physical activity/exercise, weight management, moderate sodium restriction and increased consumption of fresh fruit, vegetables, and low fat dairy, alcohol  moderation, and smoking cessation.;Monitor prescription use compliance.    Expected Outcomes Short Term: Continued assessment and intervention until BP is < 140/58mm HG in hypertensive participants. < 130/64mm HG in hypertensive participants with diabetes, heart failure or chronic kidney disease.;Long Term: Maintenance of blood pressure at goal levels.    Lipids Yes    Intervention Provide education and support for participant on nutrition & aerobic/resistive exercise along  with prescribed medications to achieve LDL 70mg , HDL >40mg .    Expected Outcomes Short Term: Participant states understanding of desired cholesterol values and is compliant with medications prescribed. Participant is following exercise prescription and nutrition guidelines.;Long Term: Cholesterol controlled with medications as prescribed, with individualized exercise RX and with personalized nutrition plan. Value goals: LDL < 70mg , HDL > 40 mg.    Personal Goal Other Yes    Personal Goal Short: get up from flooron her own, Long term: safely be able to garden,walking on treadmill again    Intervention Will continue to monitor pt and progress workloads as tolerated without sign or symptom    Expected Outcomes Pt will achieve her goals and gain strength             Core Components/Risk Factors/Patient Goals Review:    Core Components/Risk Factors/Patient Goals at Discharge (Final Review):    ITP Comments:  ITP Comments     Row Name 03/07/23 1304           ITP Comments Dr. Fransico Him medical director. Introduction to Pritikin education program/intensive cardiac rehab. Initial Orientation packet reviewed with patient                Comments: Participant attended orientation for the cardiac rehabilitation program on  03/07/2023  to perform initial intake and exercise walk test. Patient introduced to the [place] education and orientation packet was reviewed. Completed 6-minute walk test, measurements,  initial ITP, and exercise prescription. Vital signs stable. Telemetry-normal sinus rhythm, asymptomatic.   Service time was from 1010 to 1220.

## 2023-03-13 ENCOUNTER — Encounter (HOSPITAL_COMMUNITY)
Admission: RE | Admit: 2023-03-13 | Discharge: 2023-03-13 | Disposition: A | Discharge status: Home / Self Care | Payer: BC Managed Care – PPO | Source: Ambulatory Visit | Attending: Cardiovascular Disease | Admitting: Cardiovascular Disease

## 2023-03-13 DIAGNOSIS — I252 Old myocardial infarction: Secondary | ICD-10-CM | POA: Insufficient documentation

## 2023-03-13 DIAGNOSIS — I2121 ST elevation (STEMI) myocardial infarction involving left circumflex coronary artery: Secondary | ICD-10-CM | POA: Diagnosis present

## 2023-03-13 DIAGNOSIS — Z955 Presence of coronary angioplasty implant and graft: Secondary | ICD-10-CM | POA: Diagnosis present

## 2023-03-13 DIAGNOSIS — Z48812 Encounter for surgical aftercare following surgery on the circulatory system: Secondary | ICD-10-CM | POA: Insufficient documentation

## 2023-03-13 LAB — GLUCOSE, CAPILLARY
Glucose-Capillary: 206 mg/dL — ABNORMAL HIGH (ref 70–99)
Glucose-Capillary: 152 mg/dL — ABNORMAL HIGH (ref 70–99)

## 2023-03-13 NOTE — Progress Notes (Signed)
Daily Session Note  Patient Details  Name: Wanda Medina MRN: [id number] Date of Birth: 1951-08-28 Referring Provider:   Flowsheet Row INTENSIVE CARDIAC REHAB ORIENT from 03/07/2023 in [facility]  Referring Provider Dr. Quay Burow MD       Encounter Date: 03/13/2023  Check In:  Session Check In - 03/13/23 1032       Check-In   Supervising physician immediately available to respond to emergencies [facility] MD immediately available    Physician(s) Ambrose Pancoast, NP    Location [facility]    Staff Present Esmeralda Links BS, ACSM-CEP, Exercise Physiologist;Olinty Celesta Aver, MS, ACSM-CEP, Exercise Physiologist;David Makemson, MS, ACSM-CEP, CCRP, Exercise Physiologist;Demorio Seeley, RN, BSN    Virtual Visit No    Medication changes reported     No    Fall or balance concerns reported    No    Tobacco Cessation No Change    Warm-up and Cool-down Performed as group-led instruction   Cardiac Rehab Orientation   Resistance Training Performed No    VAD Patient? No    PAD/SET Patient? No      Pain Assessment   Currently in Pain? No/denies    Pain Score 0-No pain    Multiple Pain Sites No             Capillary Blood Glucose: Results for orders placed or performed during the hospital encounter of 03/13/23 (from the past 24 hour(s))  Glucose, capillary     Status: Abnormal   Collection Time: 03/13/23 10:20 AM  Result Value Ref Range   Glucose-Capillary 206 (H) 70 - 99 mg/dL  Glucose, capillary     Status: Abnormal   Collection Time: 03/13/23 11:21 AM  Result Value Ref Range   Glucose-Capillary 152 (H) 70 - 99 mg/dL     Exercise Prescription Changes - 03/13/23 1014       Response to Exercise   Blood Pressure (Admit) 122/80    Blood Pressure (Exercise) 130/64    Blood Pressure (Exit) 108/78    Heart Rate (Admit) 89 bpm    Heart Rate (Exercise) 116 bpm    Heart Rate (Exit) 92 bpm     Rating of Perceived Exertion (Exercise) 11    Symptoms None    Comments Off to a good start with exercise.    Duration Continue with 30 min of aerobic exercise without signs/symptoms of physical distress.    Intensity THRR unchanged      Progression   Progression Continue to progress workloads to maintain intensity without signs/symptoms of physical distress.    Average METs 2.9      Resistance Training   Training Prescription No   Relaxation day, no weights.     Interval Training   Interval Training No      Treadmill   MPH 2.3    Grade 0    Minutes 15    METs 2.76      NuStep   Level 2    SPM 80    Minutes 15    METs 3             Social History   Tobacco Use  Smoking Status Never  Smokeless Tobacco Never    Goals Met:  Exercise tolerated well No report of concerns or symptoms today  Goals Unmet:  Not Applicable  Comments: Pt started cardiac rehab today.  Pt tolerated light exercise without difficulty. VSS, telemetry-Sinus Rhythm, asymptomatic.  Medication list reconciled. Pt denies barriers to medicaiton compliance.  PSYCHOSOCIAL ASSESSMENT:  PHQ-0. Pt exhibits positive coping skills, hopeful outlook with supportive family. No psychosocial needs identified at this time, no psychosocial interventions necessary.    Pt enjoys yard work, sewing and cooking.   Pt oriented to exercise equipment and routine.    Understanding verbalized. Harrell Gave RN BSN    Dr. Fransico Him is Medical Director for Cardiac Rehab at [facility].

## 2023-03-15 ENCOUNTER — Encounter (HOSPITAL_COMMUNITY)
Admission: RE | Admit: 2023-03-15 | Discharge: 2023-03-15 | Disposition: A | Discharge status: Home / Self Care | Payer: BC Managed Care – PPO | Source: Ambulatory Visit | Attending: Cardiovascular Disease | Admitting: Cardiovascular Disease

## 2023-03-15 DIAGNOSIS — I2121 ST elevation (STEMI) myocardial infarction involving left circumflex coronary artery: Secondary | ICD-10-CM

## 2023-03-15 DIAGNOSIS — Z955 Presence of coronary angioplasty implant and graft: Secondary | ICD-10-CM | POA: Diagnosis not present

## 2023-03-15 LAB — GLUCOSE, CAPILLARY
Glucose-Capillary: 104 mg/dL — ABNORMAL HIGH (ref 70–99)
Glucose-Capillary: 111 mg/dL — ABNORMAL HIGH (ref 70–99)

## 2023-03-18 ENCOUNTER — Encounter (HOSPITAL_COMMUNITY)
Admission: RE | Admit: 2023-03-18 | Discharge: 2023-03-18 | Disposition: A | Discharge status: Home / Self Care | Payer: BC Managed Care – PPO | Source: Ambulatory Visit | Attending: Cardiovascular Disease | Admitting: Cardiovascular Disease

## 2023-03-18 DIAGNOSIS — Z955 Presence of coronary angioplasty implant and graft: Secondary | ICD-10-CM | POA: Diagnosis not present

## 2023-03-18 DIAGNOSIS — I2121 ST elevation (STEMI) myocardial infarction involving left circumflex coronary artery: Secondary | ICD-10-CM

## 2023-03-18 LAB — GLUCOSE, CAPILLARY
Glucose-Capillary: 121 mg/dL — ABNORMAL HIGH (ref 70–99)
Glucose-Capillary: 197 mg/dL — ABNORMAL HIGH (ref 70–99)

## 2023-03-20 ENCOUNTER — Encounter (HOSPITAL_COMMUNITY)
Admission: RE | Admit: 2023-03-20 | Discharge: 2023-03-20 | Disposition: A | Discharge status: Home / Self Care | Payer: BC Managed Care – PPO | Source: Ambulatory Visit | Attending: Cardiovascular Disease | Admitting: Cardiovascular Disease

## 2023-03-20 DIAGNOSIS — Z955 Presence of coronary angioplasty implant and graft: Secondary | ICD-10-CM

## 2023-03-20 DIAGNOSIS — I2121 ST elevation (STEMI) myocardial infarction involving left circumflex coronary artery: Secondary | ICD-10-CM

## 2023-03-22 ENCOUNTER — Encounter (HOSPITAL_COMMUNITY)
Admission: RE | Admit: 2023-03-22 | Discharge: 2023-03-22 | Disposition: A | Discharge status: Home / Self Care | Payer: BC Managed Care – PPO | Source: Ambulatory Visit | Attending: Cardiovascular Disease | Admitting: Cardiovascular Disease

## 2023-03-22 DIAGNOSIS — Z955 Presence of coronary angioplasty implant and graft: Secondary | ICD-10-CM | POA: Diagnosis not present

## 2023-03-22 DIAGNOSIS — I2121 ST elevation (STEMI) myocardial infarction involving left circumflex coronary artery: Secondary | ICD-10-CM

## 2023-03-25 ENCOUNTER — Encounter (HOSPITAL_COMMUNITY)
Admission: RE | Admit: 2023-03-25 | Discharge: 2023-03-25 | Disposition: A | Discharge status: Home / Self Care | Payer: BC Managed Care – PPO | Source: Ambulatory Visit | Attending: Cardiovascular Disease | Admitting: Cardiovascular Disease

## 2023-03-25 DIAGNOSIS — Z955 Presence of coronary angioplasty implant and graft: Secondary | ICD-10-CM

## 2023-03-25 DIAGNOSIS — I2121 ST elevation (STEMI) myocardial infarction involving left circumflex coronary artery: Secondary | ICD-10-CM

## 2023-03-25 NOTE — Progress Notes (Signed)
Cardiac Individual Treatment Plan  Patient Details  Name: Margarethe Virgen MRN: [id number] Date of Birth: 11-24-1951 Referring Provider:   Flowsheet Row INTENSIVE CARDIAC REHAB ORIENT from 03/07/2023 in [facility]  Referring Provider Dr. 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 Swallow whole., Disp: 90 tablet, Rfl: 3   calcium-vitamin D (OSCAL WITH D) 500-200 MG-UNIT tablet, Take 1 tablet by mouth daily., Disp: , Rfl:    cyclobenzaprine (FLEXERIL) 5 MG tablet, Take 1 tablet (5 mg total) by mouth 2 (two) times daily as needed for muscle spasms., Disp: 180 tablet, Rfl: 3   dapagliflozin propanediol (FARXIGA) 10 MG TABS tablet, Take 1 tablet (10 mg total) by mouth daily before breakfast., Disp: 90 tablet, Rfl: 3   Dulaglutide (TRULICITY) 3 MG/0.5ML SOPN, Inject 3 mg as directed once a week., Disp: 2 mL, Rfl: 5   ferrous sulfate 324 (65 Fe) MG TBEC, Take 324 mg by mouth daily., Disp: , Rfl:    fluticasone (FLONASE) 50 MCG/ACT nasal spray, Place 2 sprays into both nostrils daily. (Patient taking differently: Place 2 sprays into both nostrils daily as needed for allergies or rhinitis.), Disp: 16 g, Rfl: 6   gabapentin (NEURONTIN) 100 MG capsule, Take 1 capsule (100 mg total) by mouth 2 (two) times daily., Disp: 180 capsule, Rfl: 3   Lancets (BD LANCET ULTRAFINE 30G) MISC, 1 application by Does not apply route in the morning and at bedtime., Disp: 360  each, Rfl: 3   metoprolol succinate (TOPROL-XL) 25 MG 24 hr tablet, Take 1 tablet (25 mg total) by mouth daily., Disp: 90 tablet, Rfl: 3   montelukast (SINGULAIR) 10 MG tablet, TAKE 1 TABLET BY MOUTH EVERYDAY AT BEDTIME (Patient taking differently: Take 10 mg by mouth at bedtime.), Disp: 90 tablet, Rfl: 0   nitroGLYCERIN (NITROSTAT) 0.4 MG SL tablet, Place 1 tablet (0.4 mg total) under the tongue every 5 (five) minutes x 3 doses as needed for chest pain., Disp: 25 tablet, Rfl: 1   omeprazole (PRILOSEC) 20 MG capsule, Take 1 capsule (20 mg total) by mouth daily., Disp: 90 capsule, Rfl: 3   ONETOUCH ULTRA test strip, TEST BLOOD SUGARS TWICE DAILY AS NEEDED., Disp: 100 strip, Rfl: 3   pseudoephedrine (SUDAFED 12 HOUR) 120 MG 12 hr tablet, Take 1 tablet (120 mg total) by mouth 2 (two) times daily. (Patient taking differently: Take 120 mg by mouth 2 (two) times daily as needed for congestion.), Disp: 20 tablet, Rfl: 0   rosuvastatin (CRESTOR) 40 MG tablet, Take 1 tablet (40 mg total) by mouth daily., Disp: 90 tablet, Rfl: 3   sacubitril-valsartan (ENTRESTO) 24-26 MG, Take 1 tablet by mouth 2 (two) times daily., Disp: 60 tablet, Rfl: 3   Saline (SIMPLY SALINE) 0.9 % AERS, Place 2 each into the nose daily as needed. (Patient not taking: Reported on 03/07/2023), Disp: 500 mL, Rfl: 1   ticagrelor (BRILINTA) 90 MG TABS tablet, Take 1 tablet (90 mg total) by mouth  2 (two) times daily., Disp: 180 tablet, Rfl: 3   TRULICITY 0.75 MG/0.5ML SOPN, Inject 0.75 mg into the skin once a week., Disp: 2 mL, Rfl: 5   zinc gluconate 50 MG tablet, Take 50 mg by mouth daily., Disp: , Rfl:   Past Medical History: Past Medical History:  Diagnosis Date   Allergy    Arthritis    Chronic kidney disease    AKI saw Nephrologist in [place]   Diabetes mellitus without complication (HCC)    GERD (gastroesophageal reflux disease)    Hyperlipemia    Hypertension     Tobacco Use: Social History   Tobacco Use  Smoking Status  Never  Smokeless Tobacco Never    Labs: Review Flowsheet  More data exists      Latest Ref Rng & Units 08/15/2022 08/22/2022 11/19/2022 02/02/2023 02/05/2023  Labs for ITP Cardiac and Pulmonary Rehab  Cholestrol 0 - 200 mg/dL - 161  - - 096   LDL (calc) 0 - 99 mg/dL - 70  - - 43   HDL-C >04 mg/dL - 54.09  - - 52   Trlycerides <150 mg/dL - 81.1  - - 78   Hemoglobin A1c 4.8 - 5.6 % 7.9  8.5  7.1  6.9  -  TCO2 22 - 32 mmol/L - - - 22  -    Capillary Blood Glucose: Lab Results  Component Value Date   GLUCAP 121 (H) 03/18/2023   GLUCAP 197 (H) 03/18/2023   GLUCAP 111 (H) 03/15/2023   GLUCAP 104 (H) 03/15/2023   GLUCAP 152 (H) 03/13/2023     Exercise Target Goals: Exercise Program Goal: Individual exercise prescription set using results from initial 6 min walk test and THRR while considering  patient's activity barriers and safety.   Exercise Prescription Goal: Initial exercise prescription builds to 30-45 minutes a day of aerobic activity, 2-3 days per week.  Home exercise guidelines will be given to patient during program as part of exercise prescription that the participant will acknowledge.  Activity Barriers & Risk Stratification:  Activity Barriers & Cardiac Risk Stratification - 03/07/23 1234       Activity Barriers & Cardiac Risk Stratification   Activity Barriers Right Knee Replacement;History of Falls    Cardiac Risk Stratification High             6 Minute Walk:  6 Minute Walk     Row Name 03/07/23 1232         6 Minute Walk   Phase Initial     Distance 1360 feet     Walk Time 6 minutes     # of Rest Breaks 0     MPH 2.58     METS 3.03     RPE 9     Perceived Dyspnea  0     VO2 Peak 10.62     Symptoms No     Resting HR 88 bpm     Resting BP 108/74     Resting Oxygen Saturation  100 %     Exercise Oxygen Saturation  during 6 min walk 100 %     Max Ex. HR 88 bpm     Max Ex. BP 122/72     2 Minute Post BP 108/74              Oxygen  Initial Assessment:   Oxygen Re-Evaluation:   Oxygen Discharge (Final Oxygen Re-Evaluation):   Initial Exercise Prescription:  Initial Exercise Prescription - 03/07/23 1200  Date of Initial Exercise RX and Referring Provider   Date 03/07/23    Referring Provider Dr. Nanetta Batty MD    Expected Discharge Date 05/17/23      Treadmill   MPH 2.3    Grade 0    Minutes 15    METs 2.76      NuStep   Level 2    SPM 80    Minutes 15    METs 2.2      Prescription Details   Frequency (times per week) 3    Duration Progress to 30 minutes of continuous aerobic without signs/symptoms of physical distress      Intensity   THRR 40-80% of Max Heartrate 59-118    Ratings of Perceived Exertion 11-13    Perceived Dyspnea 0-4      Progression   Progression Continue progressive overload as per policy without signs/symptoms or physical distress.      Resistance Training   Training Prescription Yes    Weight 2    Reps 10-15             Perform Capillary Blood Glucose checks as needed.  Exercise Prescription Changes:   Exercise Prescription Changes     Row Name 03/13/23 1014 03/25/23 1031           Response to Exercise   Blood Pressure (Admit) 122/80 104/60      Blood Pressure (Exercise) 130/64 122/68      Blood Pressure (Exit) 108/78 104/60      Heart Rate (Admit) 89 bpm 98 bpm      Heart Rate (Exercise) 116 bpm 118 bpm      Heart Rate (Exit) 92 bpm 98 bpm      Rating of Perceived Exertion (Exercise) 11 11      Symptoms None None      Comments Off to a good start with exercise. --      Duration Continue with 30 min of aerobic exercise without signs/symptoms of physical distress. Continue with 30 min of aerobic exercise without signs/symptoms of physical distress.      Intensity THRR unchanged THRR unchanged        Progression   Progression Continue to progress workloads to maintain intensity without signs/symptoms of physical distress. Continue to progress  workloads to maintain intensity without signs/symptoms of physical distress.      Average METs 2.9 3.8        Resistance Training   Training Prescription No  Relaxation day, no weights. Yes      Weight -- 2 lbs      Reps -- 10-15      Time -- 10 Minutes        Interval Training   Interval Training No No        Treadmill   MPH 2.3 2.5      Grade 0 2.5      Minutes 15 30      METs 2.76 3.78        NuStep   Level 2 --      SPM 80 --      Minutes 15 --      METs 3 --               Exercise Comments:   Exercise Comments     Row Name 03/13/23 1122 03/25/23 1046         Exercise Comments Patient tolerated first session of exercise well without symptoms. Assisted patient with warm-up/cool-down stretches.  Reviewed goals with patient.               Exercise Goals and Review:   Exercise Goals     Row Name 03/07/23 1237             Exercise Goals   Increase Physical Activity Yes       Intervention Provide advice, education, support and counseling about physical activity/exercise needs.;Develop an individualized exercise prescription for aerobic and resistive training based on initial evaluation findings, risk stratification, comorbidities and participant's personal goals.       Expected Outcomes Short Term: Attend rehab on a regular basis to increase amount of physical activity.;Long Term: Exercising regularly at least 3-5 days a week.;Long Term: Add in home exercise to make exercise part of routine and to increase amount of physical activity.       Increase Strength and Stamina Yes       Intervention Provide advice, education, support and counseling about physical activity/exercise needs.;Develop an individualized exercise prescription for aerobic and resistive training based on initial evaluation findings, risk stratification, comorbidities and participant's personal goals.       Expected Outcomes Short Term: Increase workloads from initial exercise prescription  for resistance, speed, and METs.;Short Term: Perform resistance training exercises routinely during rehab and add in resistance training at home;Long Term: Improve cardiorespiratory fitness, muscular endurance and strength as measured by increased METs and functional capacity ( )       Able to understand and use rate of perceived exertion (RPE) scale Yes       Intervention Provide education and explanation on how to use RPE scale       Expected Outcomes Short Term: Able to use RPE daily in rehab to express subjective intensity level;Long Term:  Able to use RPE to guide intensity level when exercising independently       Knowledge and understanding of Target Heart Rate Range (THRR) Yes       Intervention Provide education and explanation of THRR including how the numbers were predicted and where they are located for reference       Expected Outcomes Short Term: Able to state/look up THRR;Long Term: Able to use THRR to govern intensity when exercising independently;Short Term: Able to use daily as guideline for intensity in rehab       Understanding of Exercise Prescription Yes       Intervention Provide education, explanation, and written materials on patient's individual exercise prescription       Expected Outcomes Short Term: Able to explain program exercise prescription;Long Term: Able to explain home exercise prescription to exercise independently                Exercise Goals Re-Evaluation :  Exercise Goals Re-Evaluation     Row Name 03/13/23 1122 03/25/23 1046           Exercise Goal Re-Evaluation   Exercise Goals Review Increase Physical Activity;Increase Strength and Stamina;Able to understand and use rate of perceived exertion (RPE) scale Increase Physical Activity;Increase Strength and Stamina;Able to understand and use rate of perceived exertion (RPE) scale      Comments Patient able to understand and use RPE scale appropriately. Patient is walking on her treadmill at home in  addition to exercise at cardiac rehab. Her goal is to get back to walking a 15 minute milewith incline. Patient would like to get back to walking 2.5 hours as she previously did. Patient is currently walking about 6 miles, 15,000 steps daily.  Expected Outcomes Progress workloads as tolerated to help increase cardiorespiratory fitness. Increase workloads on treadmill to get back to previous pace.               Discharge Exercise Prescription (Final Exercise Prescription Changes):  Exercise Prescription Changes - 03/25/23 1031       Response to Exercise   Blood Pressure (Admit) 104/60    Blood Pressure (Exercise) 122/68    Blood Pressure (Exit) 104/60    Heart Rate (Admit) 98 bpm    Heart Rate (Exercise) 118 bpm    Heart Rate (Exit) 98 bpm    Rating of Perceived Exertion (Exercise) 11    Symptoms None    Duration Continue with 30 min of aerobic exercise without signs/symptoms of physical distress.    Intensity THRR unchanged      Progression   Progression Continue to progress workloads to maintain intensity without signs/symptoms of physical distress.    Average METs 3.8      Resistance Training   Training Prescription Yes    Weight 2 lbs    Reps 10-15    Time 10 Minutes      Interval Training   Interval Training No      Treadmill   MPH 2.5    Grade 2.5    Minutes 30    METs 3.78             Nutrition:  Target Goals: Understanding of nutrition guidelines, daily intake of sodium 1500mg , cholesterol 200mg , calories 30% from fat and 7% or less from saturated fats, daily to have 5 or more servings of fruits and vegetables.  Biometrics:  Pre Biometrics - 03/07/23 1237       Pre Biometrics   Waist Circumference 34.5 inches    Hip Circumference 39 inches    Waist to Hip Ratio 0.88 %    Triceps Skinfold 13 mm    % Body Fat 32.6 %    Grip Strength 16 kg    Flexibility 14.75 in    Single Leg Stand 19 seconds              Nutrition Therapy Plan and  Nutrition Goals:  Nutrition Therapy & Goals - 03/13/23 1110       Nutrition Therapy   Diet Heart healthy/Carbohydrate Consistent    Drug/Food Interactions Statins/Certain Fruits      Personal Nutrition Goals   Nutrition Goal Patient to identify strategies for reducing cardiovascular risk by attending the [employer] education and nutrition series weekly.    Personal Goal #2 Patient to improve diet quality by using the plate method as a guide for meal planning to include lean protein/plant protein, fruits, vegetables, whole grains, nonfat dairy as part of a well-balanced diet.    Comments Patient reports following a vegetarian diet lifelong; she does eat dairy products. She reports that she has reduced carbohydrate portions over the last ~6 months and this has helped her A1c. She will benefit from participation in intensive cardiac rehab for nutrition, exercise, and lifestyle modification.      Intervention Plan   Intervention Prescribe, educate and counsel regarding individualized specific dietary modifications aiming towards targeted core components such as weight, hypertension, lipid management, diabetes, heart failure and other comorbidities.;Nutrition handout(s) given to patient.    Expected Outcomes Short Term Goal: Understand basic principles of dietary content, such as calories, fat, sodium, cholesterol and nutrients.;Long Term Goal: Adherence to prescribed nutrition plan.  Nutrition Assessments:  MEDIFICTS Score Key: ?70 Need to make dietary changes  40-70 Heart Healthy Diet ? 40 Therapeutic Level Cholesterol Diet    Picture Your Plate Scores: <79 Unhealthy dietary pattern with much room for improvement. 41-50 Dietary pattern unlikely to meet recommendations for good health and room for improvement. 51-60 More healthful dietary pattern, with some room for improvement.  >60 Healthy dietary pattern, although there may be some specific behaviors that could be improved.     Nutrition Goals Re-Evaluation:  Nutrition Goals Re-Evaluation     Row Name 03/13/23 1110             Goals   Comment A1c 6.9, lipids WNL (LDL <55), lipoprotein A 168.9, AST 373, ALT 49, GFR 45, Cr 1.26       Expected Outcome Patient reports following a vegetarian diet lifelong; she does eat dairy products. She reports that she has reduced carbohydrate portions over the last ~6 months and this has helped her A1c. She will benefit from participation in intensive cardiac rehab for nutrition, exercise, and lifestyle modification.                Nutrition Goals Re-Evaluation:  Nutrition Goals Re-Evaluation     Row Name 03/13/23 1110             Goals   Comment A1c 6.9, lipids WNL (LDL <55), lipoprotein A 168.9, AST 373, ALT 49, GFR 45, Cr 1.26       Expected Outcome Patient reports following a vegetarian diet lifelong; she does eat dairy products. She reports that she has reduced carbohydrate portions over the last ~6 months and this has helped her A1c. She will benefit from participation in intensive cardiac rehab for nutrition, exercise, and lifestyle modification.                Nutrition Goals Discharge (Final Nutrition Goals Re-Evaluation):  Nutrition Goals Re-Evaluation - 03/13/23 1110       Goals   Comment A1c 6.9, lipids WNL (LDL <55), lipoprotein A 168.9, AST 373, ALT 49, GFR 45, Cr 1.26    Expected Outcome Patient reports following a vegetarian diet lifelong; she does eat dairy products. She reports that she has reduced carbohydrate portions over the last ~6 months and this has helped her A1c. She will benefit from participation in intensive cardiac rehab for nutrition, exercise, and lifestyle modification.             Psychosocial: Target Goals: Acknowledge presence or absence of significant depression and/or stress, maximize coping skills, provide positive support system. Participant is able to verbalize types and ability to use techniques and skills  needed for reducing stress and depression.  Initial Review & Psychosocial Screening:  Initial Psych Review & Screening - 03/07/23 1156       Initial Review   Current issues with None Identified      Family Dynamics   Good Support System? Yes    Comments Pt has family at home for support      Barriers   Psychosocial barriers to participate in program There are no identifiable barriers or psychosocial needs.      Screening Interventions   Interventions Encouraged to exercise;Provide feedback about the scores to participant             Quality of Life Scores:  Quality of Life - 03/07/23 1241       Quality of Life   Select Quality of Life      Quality of  Life Scores   Health/Function Pre 29.5 %    Socioeconomic Pre 30 %    Psych/Spiritual Pre 30 %    Family Pre 30 %    GLOBAL Pre 29.79 %            Scores of 19 and below usually indicate a poorer quality of life in these areas.  A difference of  2-3 points is a clinically meaningful difference.  A difference of 2-3 points in the total score of the Quality of Life Index has been associated with significant improvement in overall quality of life, self-image, physical symptoms, and general health in studies assessing change in quality of life.  PHQ-9: Review Flowsheet  More data exists      03/07/2023 02/19/2023 11/19/2022 09/19/2022 08/15/2022  Depression screen PHQ 2/9  Decreased Interest 0 0 0 0 0  Down, Depressed, Hopeless 0 0 0 0 0  PHQ - 2 Score 0 0 0 0 0  Altered sleeping 0 - - - -  Tired, decreased energy 0 - - - -  Change in appetite 0 - - - -  Feeling bad or failure about yourself  0 - - - -  Trouble concentrating 0 - - - -  Moving slowly or fidgety/restless 0 - - - -  Suicidal thoughts 0 - - - -  PHQ-9 Score 0 - - - -  Difficult doing work/chores Not difficult at all - - - -   Interpretation of Total Score  Total Score Depression Severity:  1-4 = Minimal depression, 5-9 = Mild depression, 10-14 =  Moderate depression, 15-19 = Moderately severe depression, 20-27 = Severe depression   Psychosocial Evaluation and Intervention:   Psychosocial Re-Evaluation:  Psychosocial Re-Evaluation     Row Name 03/14/23 1410 03/21/23 1555           Psychosocial Re-Evaluation   Current issues with None Identified None Identified      Interventions Encouraged to attend Cardiac Rehabilitation for the exercise Encouraged to attend Cardiac Rehabilitation for the exercise      Continue Psychosocial Services  No Follow up required No Follow up required               Psychosocial Discharge (Final Psychosocial Re-Evaluation):  Psychosocial Re-Evaluation - 03/21/23 1555       Psychosocial Re-Evaluation   Current issues with None Identified    Interventions Encouraged to attend Cardiac Rehabilitation for the exercise    Continue Psychosocial Services  No Follow up required             Vocational Rehabilitation: Provide vocational rehab assistance to qualifying candidates.   Vocational Rehab Evaluation & Intervention:  Vocational Rehab - 03/07/23 1253       Initial Vocational Rehab Evaluation & Intervention   Assessment shows need for Vocational Rehabilitation No   Pt is retired and does not have a need for vocational rehab            Education: Education Goals: Education classes will be provided on a weekly basis, covering required topics. Participant will state understanding/return demonstration of topics presented.    Education     Row Name 03/13/23 1500     Education   Cardiac Education Topics Pritikin   Orthoptist   Educator Dietitian   Weekly Topic Tasty Appetizers and Snacks   Instruction Review Code 1- Verbalizes Understanding   Class Start Time 1145   Class Stop Time 1223  Class Time Calculation (min) 38 min    Row Name 03/15/23 1300     Education   Cardiac Education Topics Pritikin   Select Core Videos     Core Videos    Educator Dietitian   Select Nutrition   Nutrition Calorie Density   Instruction Review Code 1- Verbalizes Understanding   Class Start Time 1145   Class Stop Time 1225   Class Time Calculation (min) 40 min    Row Name 03/18/23 1400     Education   Cardiac Education Topics [facility]in   Nurse, children's   Educator Dietitian   Select Nutrition   Nutrition Nutrition Action Plan   Instruction Review Code 1- Verbalizes Understanding   Class Start Time 1145   Class Stop Time 1219   Class Time Calculation (min) 34 min    Row Name 03/20/23 1300     Education   Cardiac Education Topics Pritikin   Customer service manager   Weekly Topic Efficiency Cooking - Meals in a Snap   Instruction Review Code 1- Verbalizes Understanding   Class Start Time 1140   Class Stop Time 1220   Class Time Calculation (min) 40 min    Row Name 03/22/23 1400     Education   Cardiac Education Topics [employer]ikin   Psychologist, forensic Exercise Education   Exercise Education Move It!   Instruction Review Code 1- Verbalizes Understanding   Class Start Time 1150   Class Stop Time 1230   Class Time Calculation (min) 40 min    Row Name 03/25/23 1600     Education   Cardiac Education Topics [employer]ritikin   Geographical information systems officer Psychosocial   Psychosocial Workshop Focused Goals, Sustainable Changes   Instruction Review Code 1- Verbalizes Understanding   Class Start Time 1150   Class Stop Time 1225   Class Time Calculation (min) 35 min            Core Videos: Exercise    Move It!  Clinical staff conducted group or individual video education with verbal and written material and guidebook.  Patient learns the recommended P[employer]ikin exercise program. Exercise with the goal of living a long, healthy life. Some of the health benefits of  exercise include controlled diabetes, healthier blood pressure levels, improved cholesterol levels, improved heart and lung capacity, improved sleep, and better body composition. Everyone should speak with their doctor before starting or changing an exercise routine.  Biomechanical Limitations Clinical staff conducted group or individual video education with verbal and written material and guidebook.  Patient learns how biomechanical limitations can impact exercise and how we can mitigate and possibly overcome limitations to have an impactful and balanced exercise routine.  Body Composition Clinical staff conducted group or individual video education with verbal and written material and guidebook.  Patient learns that body composition (ratio of muscle mass to fat mass) is a key component to assessing overall fitness, rather than body weight alone. Increased fat mass, especially visceral belly fat, can put [place] at increased risk for metabolic syndrome, type 2 diabetes, heart disease, and even death. It is recommended to combine diet and exercise (cardiovascular and resistance training) to improve your body composition. Seek guidance from your physician and exercise physiologist before implementing an  exercise routine.  Exercise Action Plan Clinical staff conducted group or individual video education with verbal and written material and guidebook.  Patient learns the recommended strategies to achieve and enjoy long-term exercise adherence, including variety, self-motivation, self-efficacy, and positive decision making. Benefits of exercise include fitness, good health, weight management, more energy, better sleep, less stress, and overall well-being.  Medical   Heart Disease Risk Reduction Clinical staff conducted group or individual video education with verbal and written material and guidebook.  Patient learns our heart is our most vital organ as it circulates oxygen, nutrients, white blood cells, and  hormones throughout the entire body, and carries waste away. Data supports a plant-based eating plan like the Pritikin Program for its effectiveness in slowing progression of and reversing heart disease. The video provides a number of recommendations to address heart disease.   Metabolic Syndrome and Belly Fat  Clinical staff conducted group or individual video education with verbal and written material and guidebook.  Patient learns what metabolic syndrome is, how it leads to heart disease, and how one can reverse it and keep it from coming back. You have metabolic syndrome if you have 3 of the following 5 criteria: abdominal obesity, high blood pressure, high triglycerides, low HDL cholesterol, and high blood sugar.  Hypertension and Heart Disease Clinical staff conducted group or individual video education with verbal and written material and guidebook.  Patient learns that high blood pressure, or hypertension, is very common in the [place]. Hypertension is largely due to excessive salt intake, but other important risk factors include being overweight, physical inactivity, drinking too much alcohol, smoking, and not eating enough potassium from fruits and vegetables. High blood pressure is a leading risk factor for heart attack, stroke, congestive heart failure, dementia, kidney failure, and premature death. Long-term effects of excessive salt intake include stiffening of the arteries and thickening of heart muscle and organ damage. Recommendations include ways to reduce hypertension and the risk of heart disease.  Diseases of Our Time - Focusing on Diabetes Clinical staff conducted group or individual video education with verbal and written material and guidebook.  Patient learns why the best way to stop diseases of our time is prevention, through food and other lifestyle changes. Medicine (such as prescription pills and surgeries) is often only a Band-Aid on the problem, not a long-term  solution. Most common diseases of our time include obesity, type 2 diabetes, hypertension, heart disease, and cancer. The Pritikin Program is recommended and has been proven to help reduce, reverse, and/or prevent the damaging effects of metabolic syndrome.  Nutrition   Overview of the Pritikin Eating Plan  Clinical staff conducted group or individual video education with verbal and written material and guidebook.  Patient learns about the Pritikin Eating Plan for disease risk reduction. The Pritikin Eating Plan emphasizes a wide variety of unrefined, minimally-processed carbohydrates, like fruits, vegetables, whole grains, and legumes. Go, Caution, and Stop food choices are explained. Plant-based and lean animal proteins are emphasized. Rationale provided for low sodium intake for blood pressure control, low added sugars for blood sugar stabilization, and low added fats and oils for coronary artery disease risk reduction and weight management.  Calorie Density  Clinical staff conducted group or individual video education with verbal and written material and guidebook.  Patient learns about calorie density and how it impacts the Pritikin Eating Plan. Knowing the characteristics of the food you choose will help you decide whether those foods will lead to weight gain or weight  loss, and whether you want to consume more or less of them. Weight loss is usually a side effect of the Pritikin Eating Plan because of its focus on low calorie-dense foods.  Label Reading  Clinical staff conducted group or individual video education with verbal and written material and guidebook.  Patient learns about the Pritikin recommended label reading guidelines and corresponding recommendations regarding calorie density, added sugars, sodium content, and whole grains.  Dining Out - Part 1  Clinical staff conducted group or individual video education with verbal and written material and guidebook.  Patient learns that  restaurant meals can be sabotaging because they can be so high in calories, fat, sodium, and/or sugar. Patient learns recommended strategies on how to positively address this and avoid unhealthy pitfalls.  Facts on Fats  Clinical staff conducted group or individual video education with verbal and written material and guidebook.  Patient learns that lifestyle modifications can be just as effective, if not more so, as many medications for lowering your risk of heart disease. A Pritikin lifestyle can help to reduce your risk of inflammation and atherosclerosis (cholesterol build-up, or plaque, in the artery walls). Lifestyle interventions such as dietary choices and physical activity address the cause of atherosclerosis. A review of the types of fats and their impact on blood cholesterol levels, along with dietary recommendations to reduce fat intake is also included.  Nutrition Action Plan  Clinical staff conducted group or individual video education with verbal and written material and guidebook.  Patient learns how to incorporate Pritikin recommendations into their lifestyle. Recommendations include planning and keeping personal health goals in mind as an important part of their success.  Healthy Mind-Set    Healthy Minds, Bodies, Hearts  Clinical staff conducted group or individual video education with verbal and written material and guidebook.  Patient learns how to identify when they are stressed. Video will discuss the impact of that stress, as well as the many benefits of stress management. Patient will also be introduced to stress management techniques. The way we think, act, and feel has an impact on our hearts.  How Our Thoughts Can Heal Our Hearts  Clinical staff conducted group or individual video education with verbal and written material and guidebook.  Patient learns that negative thoughts can cause depression and anxiety. This can result in negative lifestyle behavior and serious  health problems. Cognitive behavioral therapy is an effective method to help control our thoughts in order to change and improve our emotional outlook.  Additional Videos:  Exercise    Improving Performance  Clinical staff conducted group or individual video education with verbal and written material and guidebook.  Patient learns to use a non-linear approach by alternating intensity levels and lengths of time spent exercising to help burn more calories and lose more body fat. Cardiovascular exercise helps improve heart health, metabolism, hormonal balance, blood sugar control, and recovery from fatigue. Resistance training improves strength, endurance, balance, coordination, reaction time, metabolism, and muscle mass. Flexibility exercise improves circulation, posture, and balance. Seek guidance from your physician and exercise physiologist before implementing an exercise routine and learn your capabilities and proper form for all exercise.  Introduction to Yoga  Clinical staff conducted group or individual video education with verbal and written material and guidebook.  Patient learns about yoga, a discipline of the coming together of mind, breath, and body. The benefits of yoga include improved flexibility, improved range of motion, better posture and core strength, increased lung function, weight loss, and positive  self-image. Yoga's heart health benefits include lowered blood pressure, healthier heart rate, decreased cholesterol and triglyceride levels, improved immune function, and reduced stress. Seek guidance from your physician and exercise physiologist before implementing an exercise routine and learn your capabilities and proper form for all exercise.  Medical   Aging: Enhancing Your Quality of Life  Clinical staff conducted group or individual video education with verbal and written material and guidebook.  Patient learns key strategies and recommendations to stay in good physical health  and enhance quality of life, such as prevention strategies, having an advocate, securing a Health Care Proxy and Power of Attorney, and keeping a list of medications and system for tracking them. It also discusses how to avoid risk for bone loss.  Biology of Weight Control  Clinical staff conducted group or individual video education with verbal and written material and guidebook.  Patient learns that weight gain occurs because we consume more calories than we burn (eating more, moving less). Even if your body weight is normal, you may have higher ratios of fat compared to muscle mass. Too much body fat puts you at increased risk for cardiovascular disease, heart attack, stroke, type 2 diabetes, and obesity-related cancers. In addition to exercise, following the Pritikin Eating Plan can help reduce your risk.  Decoding Lab Results  Clinical staff conducted group or individual video education with verbal and written material and guidebook.  Patient learns that lab test reflects one measurement whose values change over time and are influenced by many factors, including medication, stress, sleep, exercise, food, hydration, pre-existing medical conditions, and more. It is recommended to use the knowledge from this video to become more involved with your lab results and evaluate your numbers to speak with your doctor.   Diseases of Our Time - Overview  Clinical staff conducted group or individual video education with verbal and written material and guidebook.  Patient learns that according to the CDC, 50% to 70% of chronic diseases (such as obesity, type 2 diabetes, elevated lipids, hypertension, and heart disease) are avoidable through lifestyle improvements including healthier food choices, listening to satiety cues, and increased physical activity.  Sleep Disorders Clinical staff conducted group or individual video education with verbal and written material and guidebook.  Patient learns how good  quality and duration of sleep are important to overall health and well-being. Patient also learns about sleep disorders and how they impact health along with recommendations to address them, including discussing with a physician.  Nutrition  Dining Out - Part 2 Clinical staff conducted group or individual video education with verbal and written material and guidebook.  Patient learns how to plan ahead and communicate in order to maximize their dining experience in a healthy and nutritious manner. Included are recommended food choices based on the type of restaurant the patient is visiting.   Fueling a Banker conducted group or individual video education with verbal and written material and guidebook.  There is a strong connection between our food choices and our health. Diseases like obesity and type 2 diabetes are very prevalent and are in large-part due to lifestyle choices. The Pritikin Eating Plan provides plenty of food and hunger-curbing satisfaction. It is easy to follow, affordable, and helps reduce health risks.  Menu Workshop  Clinical staff conducted group or individual video education with verbal and written material and guidebook.  Patient learns that restaurant meals can sabotage health goals because they are often packed with calories, fat, sodium, and sugar.  Recommendations include strategies to plan ahead and to communicate with the manager, chef, or server to help order a healthier meal.  Planning Your Eating Strategy  Clinical staff conducted group or individual video education with verbal and written material and guidebook.  Patient learns about the Pritikin Eating Plan and its benefit of reducing the risk of disease. The Pritikin Eating Plan does not focus on calories. Instead, it emphasizes high-quality, nutrient-rich foods. By knowing the characteristics of the foods, we choose, we can determine their calorie density and make informed  decisions.  Targeting Your Nutrition Priorities  Clinical staff conducted group or individual video education with verbal and written material and guidebook.  Patient learns that lifestyle habits have a tremendous impact on disease risk and progression. This video provides eating and physical activity recommendations based on your personal health goals, such as reducing LDL cholesterol, losing weight, preventing or controlling type 2 diabetes, and reducing high blood pressure.  Vitamins and Minerals  Clinical staff conducted group or individual video education with verbal and written material and guidebook.  Patient learns different ways to obtain key vitamins and minerals, including through a recommended healthy diet. It is important to discuss all supplements you take with your doctor.   Healthy Mind-Set    Smoking Cessation  Clinical staff conducted group or individual video education with verbal and written material and guidebook.  Patient learns that cigarette smoking and tobacco addiction pose a serious health risk which affects millions of people. Stopping smoking will significantly reduce the risk of heart disease, lung disease, and many forms of cancer. Recommended strategies for quitting are covered, including working with your doctor to develop a successful plan.  Culinary   Becoming a Set designer conducted group or individual video education with verbal and written material and guidebook.  Patient learns that cooking at home can be healthy, cost-effective, quick, and puts them in control. Keys to cooking healthy recipes will include looking at your recipe, assessing your equipment needs, planning ahead, making it simple, choosing cost-effective seasonal ingredients, and limiting the use of added fats, salts, and sugars.  Cooking - Breakfast and Snacks  Clinical staff conducted group or individual video education with verbal and written material and guidebook.   Patient learns how important breakfast is to satiety and nutrition through the entire day. Recommendations include key foods to eat during breakfast to help stabilize blood sugar levels and to prevent overeating at meals later in the day. Planning ahead is also a key component.  Cooking - Educational psychologist conducted group or individual video education with verbal and written material and guidebook.  Patient learns eating strategies to improve overall health, including an approach to cook more at home. Recommendations include thinking of animal protein as a side on your plate rather than center stage and focusing instead on lower calorie dense options like vegetables, fruits, whole grains, and plant-based proteins, such as beans. Making sauces in large quantities to freeze for later and leaving the skin on your vegetables are also recommended to maximize your experience.  Cooking - Healthy Salads and Dressing Clinical staff conducted group or individual video education with verbal and written material and guidebook.  Patient learns that vegetables, fruits, whole grains, and legumes are the foundations of the Pritikin Eating Plan. Recommendations include how to incorporate each of these in flavorful and healthy salads, and how to create homemade salad dressings. Proper handling of ingredients is also covered. Cooking - Soups and  Desserts  Cooking - Soups and Desserts Clinical staff conducted group or individual video education with verbal and written material and guidebook.  Patient learns that Pritikin soups and desserts make for easy, nutritious, and delicious snacks and meal components that are low in sodium, fat, sugar, and calorie density, while high in vitamins, minerals, and filling fiber. Recommendations include simple and healthy ideas for soups and desserts.   Overview     The Pritikin Solution Program Overview Clinical staff conducted group or individual video education with  verbal and written material and guidebook.  Patient learns that the results of the Pritikin Program have been documented in more than 100 articles published in peer-reviewed journals, and the benefits include reducing risk factors for (and, in some cases, even reversing) high cholesterol, high blood pressure, type 2 diabetes, obesity, and more! An overview of the three key pillars of the Pritikin Program will be covered: eating well, doing regular exercise, and having a healthy mind-set.  WORKSHOPS  Exercise: Exercise Basics: Building Your Action Plan Clinical staff led group instruction and group discussion with PowerPoint presentation and patient guidebook. To enhance the learning environment the use of posters, models and videos may be added. At the conclusion of this workshop, patients will comprehend the difference between physical activity and exercise, as well as the benefits of incorporating both, into their routine. Patients will understand the FITT (Frequency, Intensity, Time, and Type) principle and how to use it to build an exercise action plan. In addition, safety concerns and other considerations for exercise and cardiac rehab will be addressed by the presenter. The purpose of this lesson is to promote a comprehensive and effective weekly exercise routine in order to improve patients' overall level of fitness.   Managing Heart Disease: Your Path to a Healthier Heart Clinical staff led group instruction and group discussion with PowerPoint presentation and patient guidebook. To enhance the learning environment the use of posters, models and videos may be added.At the conclusion of this workshop, patients will understand the anatomy and physiology of the heart. Additionally, they will understand how Pritikin's three pillars impact the risk factors, the progression, and the management of heart disease.  The purpose of this lesson is to provide a high-level overview of the heart, heart  disease, and how the Pritikin lifestyle positively impacts risk factors.  Exercise Biomechanics Clinical staff led group instruction and group discussion with PowerPoint presentation and patient guidebook. To enhance the learning environment the use of posters, models and videos may be added. Patients will learn how the structural parts of their bodies function and how these functions impact their daily activities, movement, and exercise. Patients will learn how to promote a neutral spine, learn how to manage pain, and identify ways to improve their physical movement in order to promote healthy living. The purpose of this lesson is to expose patients to common physical limitations that impact physical activity. Participants will learn practical ways to adapt and manage aches and pains, and to minimize their effect on regular exercise. Patients will learn how to maintain good posture while sitting, walking, and lifting.  Balance Training and Fall Prevention  Clinical staff led group instruction and group discussion with PowerPoint presentation and patient guidebook. To enhance the learning environment the use of posters, models and videos may be added. At the conclusion of this workshop, patients will understand the importance of their sensorimotor skills (vision, proprioception, and the vestibular system) in maintaining their ability to balance as they age. Patients will apply  a variety of balancing exercises that are appropriate for their current level of function. Patients will understand the common causes for poor balance, possible solutions to these problems, and ways to modify their physical environment in order to minimize their fall risk. The purpose of this lesson is to teach patients about the importance of maintaining balance as they age and ways to minimize their risk of falling.  WORKSHOPS   Nutrition:  Fueling a Ship broker led group instruction and group  discussion with PowerPoint presentation and patient guidebook. To enhance the learning environment the use of posters, models and videos may be added. Patients will review the foundational principles of the Pritikin Eating Plan and understand what constitutes a serving size in each of the food groups. Patients will also learn Pritikin-friendly foods that are better choices when away from home and review make-ahead meal and snack options. Calorie density will be reviewed and applied to three nutrition priorities: weight maintenance, weight loss, and weight gain. The purpose of this lesson is to reinforce (in a group setting) the key concepts around what patients are recommended to eat and how to apply these guidelines when away from home by planning and selecting Pritikin-friendly options. Patients will understand how calorie density may be adjusted for different weight management goals.  Mindful Eating  Clinical staff led group instruction and group discussion with PowerPoint presentation and patient guidebook. To enhance the learning environment the use of posters, models and videos may be added. Patients will briefly review the concepts of the Pritikin Eating Plan and the importance of low-calorie dense foods. The concept of mindful eating will be introduced as well as the importance of paying attention to internal hunger signals. Triggers for non-hunger eating and techniques for dealing with triggers will be explored. The purpose of this lesson is to provide patients with the opportunity to review the basic principles of the Pritikin Eating Plan, discuss the value of eating mindfully and how to measure internal cues of hunger and fullness using the Hunger Scale. Patients will also discuss reasons for non-hunger eating and learn strategies to use for controlling emotional eating.  Targeting Your Nutrition Priorities Clinical staff led group instruction and group discussion with PowerPoint presentation and  patient guidebook. To enhance the learning environment the use of posters, models and videos may be added. Patients will learn how to determine their genetic susceptibility to disease by reviewing their family history. Patients will gain insight into the importance of diet as part of an overall healthy lifestyle in mitigating the impact of genetics and other environmental insults. The purpose of this lesson is to provide patients with the opportunity to assess their personal nutrition priorities by looking at their family history, their own health history and current risk factors. Patients will also be able to discuss ways of prioritizing and modifying the Pritikin Eating Plan for their highest risk areas  Menu  Clinical staff led group instruction and group discussion with PowerPoint presentation and patient guidebook. To enhance the learning environment the use of posters, models and videos may be added. Using menus brought in from [employer], or printed from [employer], patients will apply the Pritikin dining out guidelines that were presented in the [employer] video. Patients will also be able to practice these guidelines in a variety of provided scenarios. The purpose of this lesson is to provide patients with the opportunity to practice hands-on learning of the Pritikin Dining Out guidelines with actual menus and practice  scenarios.  Label Reading Clinical staff led group instruction and group discussion with PowerPoint presentation and patient guidebook. To enhance the learning environment the use of posters, models and videos may be added. Patients will review and discuss the Pritikin label reading guidelines presented in Pritikin's Label Reading Educational series video. Using fool labels brought in from local grocery stores and markets, patients will apply the label reading guidelines and determine if the packaged food meet the Pritikin guidelines. The purpose of this  lesson is to provide patients with the opportunity to review, discuss, and practice hands-on learning of the Pritikin Label Reading guidelines with actual packaged food labels. Cooking School  Pritikin's [employer] are designed to teach patients ways to prepare quick, simple, and affordable recipes at home. The importance of nutrition's role in chronic disease risk reduction is reflected in its emphasis in the overall Pritikin program. By learning how to prepare essential core [employer] Eating Plan recipes, patients will increase control over what they eat; be able to customize the flavor of foods without the use of added salt, sugar, or fat; and improve the quality of the food they consume. By learning a set of core recipes which are easily assembled, quickly prepared, and affordable, patients are more likely to prepare more healthy foods at home. These workshops focus on convenient breakfasts, simple entres, side dishes, and desserts which can be prepared with minimal effort and are consistent with nutrition recommendations for cardiovascular risk reduction. Cooking [employer] are taught by a Armed forces logistics/support/administrative officer (RD) who has been trained by the [employer]. The chef or RD has a clear understanding of the importance of minimizing - if not completely eliminating - added fat, sugar, and sodium in recipes. Throughout the series of Cooking School Workshop sessions, patients will learn about healthy ingredients and efficient methods of cooking to build confidence in their capability to prepare    Cooking School weekly topics:  Adding Flavor- Sodium-Free  Fast and Healthy Breakfasts  Powerhouse Plant-Based Proteins  Satisfying Salads and Dressings  Simple Sides and Sauces  International Cuisine-Spotlight on the [employer] Zones  Delicious Desserts  Savory Soups  [employer] - Meals in a Astronomer Appetizers and Snacks  Comforting Weekend Breakfasts  One-Pot  Wonders   Fast Evening Meals  Landscape architect Your Pritikin Plate  WORKSHOPS   Healthy Mindset (Psychosocial):  Focused Goals, Sustainable Changes Clinical staff led group instruction and group discussion with PowerPoint presentation and patient guidebook. To enhance the learning environment the use of posters, models and videos may be added. Patients will be able to apply effective goal setting strategies to establish at least one personal goal, and then take consistent, meaningful action toward that goal. They will learn to identify common barriers to achieving personal goals and develop strategies to overcome them. Patients will also gain an understanding of how our mind-set can impact our ability to achieve goals and the importance of cultivating a positive and growth-oriented mind-set. The purpose of this lesson is to provide patients with a deeper understanding of how to set and achieve personal goals, as well as the tools and strategies needed to overcome common obstacles which may arise along the way.  From Head to Heart: The Power of a Healthy Outlook  Clinical staff led group instruction and group discussion with PowerPoint presentation and patient guidebook. To enhance the learning environment the use of posters, models and videos may be added. Patients will be able to  recognize and describe the impact of emotions and mood on physical health. They will discover the importance of self-care and explore self-care practices which may work for them. Patients will also learn how to utilize the 4 C's to cultivate a healthier outlook and better manage stress and challenges. The purpose of this lesson is to demonstrate to patients how a healthy outlook is an essential part of maintaining good health, especially as they continue their cardiac rehab journey.  Healthy Sleep for a Healthy Heart Clinical staff led group instruction and group discussion with PowerPoint presentation and  patient guidebook. To enhance the learning environment the use of posters, models and videos may be added. At the conclusion of this workshop, patients will be able to demonstrate knowledge of the importance of sleep to overall health, well-being, and quality of life. They will understand the symptoms of, and treatments for, common sleep disorders. Patients will also be able to identify daytime and nighttime behaviors which impact sleep, and they will be able to apply these tools to help manage sleep-related challenges. The purpose of this lesson is to provide patients with a general overview of sleep and outline the importance of quality sleep. Patients will learn about a few of the most common sleep disorders. Patients will also be introduced to the concept of "sleep hygiene," and discover ways to self-manage certain sleeping problems through simple daily behavior changes. Finally, the workshop will motivate patients by clarifying the links between quality sleep and their goals of heart-healthy living.   Recognizing and Reducing Stress Clinical staff led group instruction and group discussion with PowerPoint presentation and patient guidebook. To enhance the learning environment the use of posters, models and videos may be added. At the conclusion of this workshop, patients will be able to understand the types of stress reactions, differentiate between acute and chronic stress, and recognize the impact that chronic stress has on their health. They will also be able to apply different coping mechanisms, such as reframing negative self-talk. Patients will have the opportunity to practice a variety of stress management techniques, such as deep abdominal breathing, progressive muscle relaxation, and/or guided imagery.  The purpose of this lesson is to educate patients on the role of stress in their lives and to provide healthy techniques for coping with it.  Learning Barriers/Preferences:  Learning  Barriers/Preferences - 03/07/23 1241       Learning Barriers/Preferences   Learning Barriers Sight    Learning Preferences Pictoral;Written Material;Computer/Internet             Education Topics:  Knowledge Questionnaire Score:  Knowledge Questionnaire Score - 03/07/23 1243       Knowledge Questionnaire Score   Pre Score 18/24             Core Components/Risk Factors/Patient Goals at Admission:  Personal Goals and Risk Factors at Admission - 03/07/23 1244       Core Components/Risk Factors/Patient Goals on Admission    Weight Management Yes    Diabetes Yes    Intervention Provide education about signs/symptoms and action to take for hypo/hyperglycemia.;Provide education about proper nutrition, including hydration, and aerobic/resistive exercise prescription along with prescribed medications to achieve blood glucose in normal ranges: Fasting glucose 65-99 mg/dL    Expected Outcomes Short Term: Participant verbalizes understanding of the signs/symptoms and immediate care of hyper/hypoglycemia, proper foot care and importance of medication, aerobic/resistive exercise and nutrition plan for blood glucose control.;Long Term: Attainment of HbA1C < 7%.    Hypertension Yes  Intervention Provide education on lifestyle modifcations including regular physical activity/exercise, weight management, moderate sodium restriction and increased consumption of fresh fruit, vegetables, and low fat dairy, alcohol moderation, and smoking cessation.;Monitor prescription use compliance.    Expected Outcomes Short Term: Continued assessment and intervention until BP is < 140/38mm HG in hypertensive participants. < 130/63mm HG in hypertensive participants with diabetes, heart failure or chronic kidney disease.;Long Term: Maintenance of blood pressure at goal levels.    Lipids Yes    Intervention Provide education and support for participant on nutrition & aerobic/resistive exercise along with  prescribed medications to achieve LDL 70mg , HDL >40mg .    Expected Outcomes Short Term: Participant states understanding of desired cholesterol values and is compliant with medications prescribed. Participant is following exercise prescription and nutrition guidelines.;Long Term: Cholesterol controlled with medications as prescribed, with individualized exercise RX and with personalized nutrition plan. Value goals: LDL < 70mg , HDL > 40 mg.    Personal Goal Other Yes    Personal Goal Short: get up from flooron her own, Long term: safely be able to garden,walking on treadmill again    Intervention Will continue to monitor pt and progress workloads as tolerated without sign or symptom    Expected Outcomes Pt will achieve her goals and gain strength             Core Components/Risk Factors/Patient Goals Review:   Goals and Risk Factor Review     Row Name 03/14/23 1411 03/21/23 1556           Core Components/Risk Factors/Patient Goals Review   Personal Goals Review Weight Management/Obesity;Hypertension;Lipids;Diabetes Weight Management/Obesity;Hypertension;Lipids;Diabetes      Review Smita started intensive cardiac rehab on 03/13/23 and did well with exercise. Vital signs and CBg's were stable Smita started intensive cardiac rehab on 03/13/23 and is doing well with exercise.. Vital signs and CBg's have been stable      Expected Outcomes Smita will continue to participate in intensive cardiac rehab for exercise, nutrition and lifestyle modifications Smita will continue to participate in intensive cardiac rehab for exercise, nutrition and lifestyle modifications               Core Components/Risk Factors/Patient Goals at Discharge (Final Review):   Goals and Risk Factor Review - 03/21/23 1556       Core Components/Risk Factors/Patient Goals Review   Personal Goals Review Weight Management/Obesity;Hypertension;Lipids;Diabetes    Review Smita started intensive cardiac rehab on 03/13/23  and is doing well with exercise.. Vital signs and CBg's have been stable    Expected Outcomes Smita will continue to participate in intensive cardiac rehab for exercise, nutrition and lifestyle modifications             ITP Comments:  ITP Comments     Row Name 03/07/23 1304 03/14/23 1409 03/21/23 1554       ITP Comments Dr. Armanda Magic medical director. Introduction to [facility] education program/intensive cardiac rehab. Initial Orientation packet reviewed with patient 30 Day ITP Review. Smita started intensive cardiac rehab on 03/13/23 and did well with exercise 30 Day ITP Review. Smita started intensive cardiac rehab on 03/13/23 and is off to a good start to exercise.              Comments: See ITP Comments

## 2023-03-27 ENCOUNTER — Encounter (HOSPITAL_COMMUNITY)
Admission: RE | Admit: 2023-03-27 | Discharge: 2023-03-27 | Disposition: A | Discharge status: Home / Self Care | Payer: BC Managed Care – PPO | Source: Ambulatory Visit | Attending: Cardiovascular Disease | Admitting: Cardiovascular Disease

## 2023-03-27 DIAGNOSIS — Z955 Presence of coronary angioplasty implant and graft: Secondary | ICD-10-CM

## 2023-03-27 DIAGNOSIS — I2121 ST elevation (STEMI) myocardial infarction involving left circumflex coronary artery: Secondary | ICD-10-CM

## 2023-03-29 ENCOUNTER — Encounter (HOSPITAL_COMMUNITY)
Admission: RE | Admit: 2023-03-29 | Discharge: 2023-03-29 | Disposition: A | Discharge status: Home / Self Care | Payer: BC Managed Care – PPO | Source: Ambulatory Visit | Attending: Cardiovascular Disease | Admitting: Cardiovascular Disease

## 2023-03-29 DIAGNOSIS — Z955 Presence of coronary angioplasty implant and graft: Secondary | ICD-10-CM | POA: Diagnosis not present

## 2023-03-29 DIAGNOSIS — I2121 ST elevation (STEMI) myocardial infarction involving left circumflex coronary artery: Secondary | ICD-10-CM

## 2023-04-01 ENCOUNTER — Encounter (HOSPITAL_COMMUNITY)
Admission: RE | Admit: 2023-04-01 | Discharge: 2023-04-01 | Disposition: A | Discharge status: Home / Self Care | Payer: BC Managed Care – PPO | Source: Ambulatory Visit | Attending: Cardiovascular Disease | Admitting: Cardiovascular Disease

## 2023-04-01 DIAGNOSIS — Z955 Presence of coronary angioplasty implant and graft: Secondary | ICD-10-CM | POA: Diagnosis not present

## 2023-04-01 DIAGNOSIS — I2121 ST elevation (STEMI) myocardial infarction involving left circumflex coronary artery: Secondary | ICD-10-CM

## 2023-04-03 ENCOUNTER — Encounter (HOSPITAL_COMMUNITY)
Admission: RE | Admit: 2023-04-03 | Discharge: 2023-04-03 | Disposition: A | Discharge status: Home / Self Care | Payer: BC Managed Care – PPO | Source: Ambulatory Visit | Attending: Cardiovascular Disease | Admitting: Cardiovascular Disease

## 2023-04-03 ENCOUNTER — Other Ambulatory Visit: Payer: Self-pay | Admitting: Nurse Practitioner

## 2023-04-03 DIAGNOSIS — Z955 Presence of coronary angioplasty implant and graft: Secondary | ICD-10-CM | POA: Diagnosis not present

## 2023-04-03 DIAGNOSIS — I2121 ST elevation (STEMI) myocardial infarction involving left circumflex coronary artery: Secondary | ICD-10-CM

## 2023-04-03 NOTE — Progress Notes (Signed)
Reviewed home exercise guidelines with patient including endpoints, temperature precautions, target heart rate and rate of perceived exertion. Patient is currently walking outside or on her treadmill daily as her mode of home exercise. Patient voices understanding of instructions given.  Artist Pais, MS, ACSM CEP

## 2023-04-05 ENCOUNTER — Encounter (HOSPITAL_COMMUNITY)
Admission: RE | Admit: 2023-04-05 | Discharge: 2023-04-05 | Disposition: A | Discharge status: Home / Self Care | Payer: BC Managed Care – PPO | Source: Ambulatory Visit | Attending: Cardiovascular Disease | Admitting: Cardiovascular Disease

## 2023-04-05 DIAGNOSIS — Z955 Presence of coronary angioplasty implant and graft: Secondary | ICD-10-CM

## 2023-04-05 DIAGNOSIS — I2121 ST elevation (STEMI) myocardial infarction involving left circumflex coronary artery: Secondary | ICD-10-CM

## 2023-04-08 ENCOUNTER — Encounter (HOSPITAL_COMMUNITY)
Admission: RE | Admit: 2023-04-08 | Discharge: 2023-04-08 | Disposition: A | Discharge status: Home / Self Care | Payer: BC Managed Care – PPO | Source: Ambulatory Visit | Attending: Cardiovascular Disease | Admitting: Cardiovascular Disease

## 2023-04-08 DIAGNOSIS — Z955 Presence of coronary angioplasty implant and graft: Secondary | ICD-10-CM

## 2023-04-08 DIAGNOSIS — I2121 ST elevation (STEMI) myocardial infarction involving left circumflex coronary artery: Secondary | ICD-10-CM

## 2023-04-09 LAB — HM DIABETES EYE EXAM

## 2023-04-10 ENCOUNTER — Encounter (HOSPITAL_COMMUNITY)
Admission: RE | Admit: 2023-04-10 | Discharge: 2023-04-10 | Disposition: A | Discharge status: Home / Self Care | Payer: BC Managed Care – PPO | Source: Ambulatory Visit | Attending: Cardiovascular Disease | Admitting: Cardiovascular Disease

## 2023-04-10 DIAGNOSIS — Z955 Presence of coronary angioplasty implant and graft: Secondary | ICD-10-CM | POA: Diagnosis present

## 2023-04-10 DIAGNOSIS — I2121 ST elevation (STEMI) myocardial infarction involving left circumflex coronary artery: Secondary | ICD-10-CM | POA: Insufficient documentation

## 2023-04-12 ENCOUNTER — Encounter (HOSPITAL_COMMUNITY)
Admission: RE | Admit: 2023-04-12 | Discharge: 2023-04-12 | Disposition: A | Discharge status: Home / Self Care | Payer: BC Managed Care – PPO | Source: Ambulatory Visit | Attending: Cardiovascular Disease | Admitting: Cardiovascular Disease

## 2023-04-12 DIAGNOSIS — Z955 Presence of coronary angioplasty implant and graft: Secondary | ICD-10-CM

## 2023-04-12 DIAGNOSIS — I2121 ST elevation (STEMI) myocardial infarction involving left circumflex coronary artery: Secondary | ICD-10-CM

## 2023-04-15 ENCOUNTER — Encounter (HOSPITAL_COMMUNITY)
Admission: RE | Admit: 2023-04-15 | Discharge: 2023-04-15 | Disposition: A | Discharge status: Home / Self Care | Payer: BC Managed Care – PPO | Source: Ambulatory Visit | Attending: Cardiovascular Disease | Admitting: Cardiovascular Disease

## 2023-04-15 DIAGNOSIS — I2121 ST elevation (STEMI) myocardial infarction involving left circumflex coronary artery: Secondary | ICD-10-CM

## 2023-04-15 DIAGNOSIS — Z955 Presence of coronary angioplasty implant and graft: Secondary | ICD-10-CM | POA: Diagnosis not present

## 2023-04-15 NOTE — Progress Notes (Signed)
Cardiac Individual Treatment Plan  Patient Details  Name: Latyra Zeidler MRN: [id number] Date of Birth: Jul 30, 1951 Referring Provider:   Flowsheet Row INTENSIVE CARDIAC REHAB ORIENT from 03/07/2023 in [facility]  Referring Provider Dr. 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 Swallow whole., Disp: 90 tablet, Rfl: 3   calcium-vitamin D (OSCAL WITH D) 500-200 MG-UNIT tablet, Take 1 tablet by mouth daily., Disp: , Rfl:    cyclobenzaprine (FLEXERIL) 5 MG tablet, Take 1 tablet (5 mg total) by mouth 2 (two) times daily as needed for muscle spasms., Disp: 180 tablet, Rfl: 3   dapagliflozin propanediol (FARXIGA) 10 MG TABS tablet, Take 1 tablet (10 mg total) by mouth daily before breakfast., Disp: 90 tablet, Rfl: 3   Dulaglutide (TRULICITY) 3 MG/0.5ML SOPN, Inject 3 mg as directed once a week., Disp: 2 mL, Rfl: 5   ENTRESTO 24-26 MG, TAKE 1 TABLET BY MOUTH TWICE A DAY, Disp: 60 tablet, Rfl: 3   ferrous sulfate 324 (65 Fe) MG TBEC, Take 324 mg by mouth daily., Disp: , Rfl:    fluticasone (FLONASE) 50 MCG/ACT nasal spray, Place 2 sprays into both nostrils daily. (Patient taking differently: Place 2 sprays into both nostrils daily as needed for allergies or rhinitis.), Disp: 16 g, Rfl: 6   gabapentin (NEURONTIN) 100 MG capsule, Take 1 capsule (100 mg total) by mouth 2 (two) times daily., Disp: 180 capsule, Rfl: 3   Lancets (BD LANCET ULTRAFINE 30G) MISC, 1  application by Does not apply route in the morning and at bedtime., Disp: 360 each, Rfl: 3   metoprolol succinate (TOPROL-XL) 25 MG 24 hr tablet, Take 1 tablet (25 mg total) by mouth daily., Disp: 90 tablet, Rfl: 3   montelukast (SINGULAIR) 10 MG tablet, TAKE 1 TABLET BY MOUTH EVERYDAY AT BEDTIME (Patient taking differently: Take 10 mg by mouth at bedtime.), Disp: 90 tablet, Rfl: 0   nitroGLYCERIN (NITROSTAT) 0.4 MG SL tablet, Place 1 tablet (0.4 mg total) under the tongue every 5 (five) minutes x 3 doses as needed for chest pain., Disp: 25 tablet, Rfl: 1   omeprazole (PRILOSEC) 20 MG capsule, Take 1 capsule (20 mg total) by mouth daily., Disp: 90 capsule, Rfl: 3   ONETOUCH ULTRA test strip, TEST BLOOD SUGARS TWICE DAILY AS NEEDED., Disp: 100 strip, Rfl: 3   pseudoephedrine (SUDAFED 12 HOUR) 120 MG 12 hr tablet, Take 1 tablet (120 mg total) by mouth 2 (two) times daily. (Patient taking differently: Take 120 mg by mouth 2 (two) times daily as needed for congestion.), Disp: 20 tablet, Rfl: 0   rosuvastatin (CRESTOR) 40 MG tablet, Take 1 tablet (40 mg total) by mouth daily., Disp: 90 tablet, Rfl: 3   Saline (SIMPLY SALINE) 0.9 % AERS, Place 2 each into the nose daily as needed. (Patient not taking: Reported on 03/07/2023), Disp: 500 mL, Rfl: 1   ticagrelor (BRILINTA) 90 MG TABS tablet, Take 1 tablet (90 mg total) by mouth 2 (two)  times daily., Disp: 180 tablet, Rfl: 3   TRULICITY 0.75 MG/0.5ML SOPN, Inject 0.75 mg into the skin once a week., Disp: 2 mL, Rfl: 5   zinc gluconate 50 MG tablet, Take 50 mg by mouth daily., Disp: , Rfl:   Past Medical History: Past Medical History:  Diagnosis Date   Allergy    Arthritis    Chronic kidney disease    AKI saw Nephrologist in [place]   Diabetes mellitus without complication (HCC)    GERD (gastroesophageal reflux disease)    Hyperlipemia    Hypertension     Tobacco Use: Social History   Tobacco Use  Smoking Status Never  Smokeless Tobacco Never     Labs: Review Flowsheet  More data exists      Latest Ref Rng & Units 08/15/2022 08/22/2022 11/19/2022 02/02/2023 02/05/2023  Labs for ITP Cardiac and Pulmonary Rehab  Cholestrol 0 - 200 mg/dL - 409  - - 811   LDL (calc) 0 - 99 mg/dL - 70  - - 43   HDL-C >91 mg/dL - 47.82  - - 52   Trlycerides <150 mg/dL - 95.6  - - 78   Hemoglobin A1c 4.8 - 5.6 % 7.9  8.5  7.1  6.9  -  TCO2 22 - 32 mmol/L - - - 22  -    Capillary Blood Glucose: Lab Results  Component Value Date   GLUCAP 121 (H) 03/18/2023   GLUCAP 197 (H) 03/18/2023   GLUCAP 111 (H) 03/15/2023   GLUCAP 104 (H) 03/15/2023   GLUCAP 152 (H) 03/13/2023     Exercise Target Goals: Exercise Program Goal: Individual exercise prescription set using results from initial 6 min walk test and THRR while considering  patient's activity barriers and safety.   Exercise Prescription Goal: Initial exercise prescription builds to 30-45 minutes a day of aerobic activity, 2-3 days per week.  Home exercise guidelines will be given to patient during program as part of exercise prescription that the participant will acknowledge.  Activity Barriers & Risk Stratification:  Activity Barriers & Cardiac Risk Stratification - 03/07/23 1234       Activity Barriers & Cardiac Risk Stratification   Activity Barriers Right Knee Replacement;History of Falls    Cardiac Risk Stratification High             6 Minute Walk:  6 Minute Walk     Row Name 03/07/23 1232         6 Minute Walk   Phase Initial     Distance 1360 feet     Walk Time 6 minutes     # of Rest Breaks 0     MPH 2.58     METS 3.03     RPE 9     Perceived Dyspnea  0     VO2 Peak 10.62     Symptoms No     Resting HR 88 bpm     Resting BP 108/74     Resting Oxygen Saturation  100 %     Exercise Oxygen Saturation  during 6 min walk 100 %     Max Ex. HR 88 bpm     Max Ex. BP 122/72     2 Minute Post BP 108/74              Oxygen Initial Assessment:   Oxygen  Re-Evaluation:   Oxygen Discharge (Final Oxygen Re-Evaluation):   Initial Exercise Prescription:  Initial Exercise Prescription - 03/07/23 1200  Date of Initial Exercise RX and Referring Provider   Date 03/07/23    Referring Provider Dr. Nanetta Batty MD    Expected Discharge Date 05/17/23      Treadmill   MPH 2.3    Grade 0    Minutes 15    METs 2.76      NuStep   Level 2    SPM 80    Minutes 15    METs 2.2      Prescription Details   Frequency (times per week) 3    Duration Progress to 30 minutes of continuous aerobic without signs/symptoms of physical distress      Intensity   THRR 40-80% of Max Heartrate 59-118    Ratings of Perceived Exertion 11-13    Perceived Dyspnea 0-4      Progression   Progression Continue progressive overload as per policy without signs/symptoms or physical distress.      Resistance Training   Training Prescription Yes    Weight 2    Reps 10-15             Perform Capillary Blood Glucose checks as needed.  Exercise Prescription Changes:   Exercise Prescription Changes     Row Name 03/13/23 1014 03/25/23 1031 04/08/23 1026         Response to Exercise   Blood Pressure (Admit) 122/80 104/60 104/52     Blood Pressure (Exercise) 130/64 122/68 122/70     Blood Pressure (Exit) 108/78 104/60 132/70     Heart Rate (Admit) 89 bpm 98 bpm 97 bpm     Heart Rate (Exercise) 116 bpm 118 bpm 121 bpm     Heart Rate (Exit) 92 bpm 98 bpm 104 bpm     Rating of Perceived Exertion (Exercise) 11 11 11      Symptoms None None None     Comments Off to a good start with exercise. -- Patient increases speed on TM from 2.5/2.5 to 3.0/3.0 halfway through.     Duration Continue with 30 min of aerobic exercise without signs/symptoms of physical distress. Continue with 30 min of aerobic exercise without signs/symptoms of physical distress. Continue with 30 min of aerobic exercise without signs/symptoms of physical distress.     Intensity THRR  unchanged THRR unchanged THRR unchanged       Progression   Progression Continue to progress workloads to maintain intensity without signs/symptoms of physical distress. Continue to progress workloads to maintain intensity without signs/symptoms of physical distress. Continue to progress workloads to maintain intensity without signs/symptoms of physical distress.     Average METs 2.9 3.8 4.2       Resistance Training   Training Prescription No  Relaxation day, no weights. Yes Yes     Weight -- 2 lbs 3 lbs     Reps -- 10-15 10-15     Time -- 10 Minutes 10 Minutes       Interval Training   Interval Training No No No       Treadmill   MPH 2.3 2.5 3  Increases speed from 2.5 to 3.0 mph halfway     Grade 0 2.5 3  Increases incline from 2.5 to 3.0% halfway.     Minutes 15 30 30      METs 2.76 3.78 4.2       NuStep   Level 2 -- --     SPM 80 -- --     Minutes 15 -- --     METs 3 -- --  Home Exercise Plan   Plans to continue exercise at -- -- Home (comment)  Treadmill, walking     Frequency -- -- Add 4 additional days to program exercise sessions.     Initial Home Exercises Provided -- -- 04/03/23              Exercise Comments:   Exercise Comments     Row Name 03/13/23 1122 03/25/23 1046 04/03/23 1055 04/08/23 1702     Exercise Comments Patient tolerated first session of exercise well without symptoms. Assisted patient with warm-up/cool-down stretches. Reviewed goals with patient. Reviewed home exercise and goals with patient. Reviewed METs with patient.             Exercise Goals and Review:   Exercise Goals     Row Name 03/07/23 1237             Exercise Goals   Increase Physical Activity Yes       Intervention Provide advice, education, support and counseling about physical activity/exercise needs.;Develop an individualized exercise prescription for aerobic and resistive training based on initial evaluation findings, risk stratification, comorbidities  and participant's personal goals.       Expected Outcomes Short Term: Attend rehab on a regular basis to increase amount of physical activity.;Long Term: Exercising regularly at least 3-5 days a week.;Long Term: Add in home exercise to make exercise part of routine and to increase amount of physical activity.       Increase Strength and Stamina Yes       Intervention Provide advice, education, support and counseling about physical activity/exercise needs.;Develop an individualized exercise prescription for aerobic and resistive training based on initial evaluation findings, risk stratification, comorbidities and participant's personal goals.       Expected Outcomes Short Term: Increase workloads from initial exercise prescription for resistance, speed, and METs.;Short Term: Perform resistance training exercises routinely during rehab and add in resistance training at home;Long Term: Improve cardiorespiratory fitness, muscular endurance and strength as measured by increased METs and functional capacity ( )       Able to understand and use rate of perceived exertion (RPE) scale Yes       Intervention Provide education and explanation on how to use RPE scale       Expected Outcomes Short Term: Able to use RPE daily in rehab to express subjective intensity level;Long Term:  Able to use RPE to guide intensity level when exercising independently       Knowledge and understanding of Target Heart Rate Range (THRR) Yes       Intervention Provide education and explanation of THRR including how the numbers were predicted and where they are located for reference       Expected Outcomes Short Term: Able to state/look up THRR;Long Term: Able to use THRR to govern intensity when exercising independently;Short Term: Able to use daily as guideline for intensity in rehab       Understanding of Exercise Prescription Yes       Intervention Provide education, explanation, and written materials on patient's individual  exercise prescription       Expected Outcomes Short Term: Able to explain program exercise prescription;Long Term: Able to explain home exercise prescription to exercise independently                Exercise Goals Re-Evaluation :  Exercise Goals Re-Evaluation     Row Name 03/13/23 1122 03/25/23 1046 04/03/23 1055         Exercise Goal Re-Evaluation  Exercise Goals Review Increase Physical Activity;Increase Strength and Stamina;Able to understand and use rate of perceived exertion (RPE) scale Increase Physical Activity;Increase Strength and Stamina;Able to understand and use rate of perceived exertion (RPE) scale Increase Physical Activity;Increase Strength and Stamina;Able to understand and use rate of perceived exertion (RPE) scale;Knowledge and understanding of Target Heart Rate Range (THRR);Able to check pulse independently;Understanding of Exercise Prescription     Comments Patient able to understand and use RPE scale appropriately. Patient is walking on her treadmill at home in addition to exercise at cardiac rehab. Her goal is to get back to walking a 15 minute milewith incline. Patient would like to get back to walking 2.5 hours as she previously did. Patient is currently walking about 6 miles, 15,000 steps daily. Reviewed exercise prescription with patient. Patient is walking outdoors and on her treadmill at home daily. Patient has a smart watch to monitor her pulse.     Expected Outcomes Progress workloads as tolerated to help increase cardiorespiratory fitness. Increase workloads on treadmill to get back to previous pace. Patient will continue daily exercise to help increase strength and stamina and to be able to walk longer distances again.              Discharge Exercise Prescription (Final Exercise Prescription Changes):  Exercise Prescription Changes - 04/08/23 1026       Response to Exercise   Blood Pressure (Admit) 104/52    Blood Pressure (Exercise) 122/70    Blood  Pressure (Exit) 132/70    Heart Rate (Admit) 97 bpm    Heart Rate (Exercise) 121 bpm    Heart Rate (Exit) 104 bpm    Rating of Perceived Exertion (Exercise) 11    Symptoms None    Comments Patient increases speed on TM from 2.5/2.5 to 3.0/3.0 halfway through.    Duration Continue with 30 min of aerobic exercise without signs/symptoms of physical distress.    Intensity THRR unchanged      Progression   Progression Continue to progress workloads to maintain intensity without signs/symptoms of physical distress.    Average METs 4.2      Resistance Training   Training Prescription Yes    Weight 3 lbs    Reps 10-15    Time 10 Minutes      Interval Training   Interval Training No      Treadmill   MPH 3   Increases speed from 2.5 to 3.0 mph halfway   Grade 3   Increases incline from 2.5 to 3.0% halfway.   Minutes 30    METs 4.2      Home Exercise Plan   Plans to continue exercise at Home (comment)   Treadmill, walking   Frequency Add 4 additional days to program exercise sessions.    Initial Home Exercises Provided 04/03/23             Nutrition:  Target Goals: Understanding of nutrition guidelines, daily intake of sodium 1500mg , cholesterol 200mg , calories 30% from fat and 7% or less from saturated fats, daily to have 5 or more servings of fruits and vegetables.  Biometrics:  Pre Biometrics - 03/07/23 1237       Pre Biometrics   Waist Circumference 34.5 inches    Hip Circumference 39 inches    Waist to Hip Ratio 0.88 %    Triceps Skinfold 13 mm    % Body Fat 32.6 %    Grip Strength 16 kg    Flexibility 14.75 in  Single Leg Stand 19 seconds              Nutrition Therapy Plan and Nutrition Goals:  Nutrition Therapy & Goals - 03/13/23 1110       Nutrition Therapy   Diet Heart healthy/Carbohydrate Consistent    Drug/Food Interactions Statins/Certain Fruits      Personal Nutrition Goals   Nutrition Goal Patient to identify strategies for reducing  cardiovascular risk by attending the [facility] education and nutrition series weekly.    Personal Goal #2 Patient to improve diet quality by using the plate method as a guide for meal planning to include lean protein/plant protein, fruits, vegetables, whole grains, nonfat dairy as part of a well-balanced diet.    Comments Patient reports following a vegetarian diet lifelong; she does eat dairy products. She reports that she has reduced carbohydrate portions over the last ~6 months and this has helped her A1c. She will benefit from participation in intensive cardiac rehab for nutrition, exercise, and lifestyle modification.      Intervention Plan   Intervention Prescribe, educate and counsel regarding individualized specific dietary modifications aiming towards targeted core components such as weight, hypertension, lipid management, diabetes, heart failure and other comorbidities.;Nutrition handout(s) given to patient.    Expected Outcomes Short Term Goal: Understand basic principles of dietary content, such as calories, fat, sodium, cholesterol and nutrients.;Long Term Goal: Adherence to prescribed nutrition plan.             Nutrition Assessments:  MEDIFICTS Score Key: ?70 Need to make dietary changes  40-70 Heart Healthy Diet ? 40 Therapeutic Level Cholesterol Diet    Picture Your Plate Scores: <16 Unhealthy dietary pattern with much room for improvement. 41-50 Dietary pattern unlikely to meet recommendations for good health and room for improvement. 51-60 More healthful dietary pattern, with some room for improvement.  >60 Healthy dietary pattern, although there may be some specific behaviors that could be improved.    Nutrition Goals Re-Evaluation:  Nutrition Goals Re-Evaluation     Row Name 03/13/23 1110             Goals   Comment A1c 6.9, lipids WNL (LDL <55), lipoprotein A 168.9, AST 373, ALT 49, GFR 45, Cr 1.26       Expected Outcome Patient reports following a  vegetarian diet lifelong; she does eat dairy products. She reports that she has reduced carbohydrate portions over the last ~6 months and this has helped her A1c. She will benefit from participation in intensive cardiac rehab for nutrition, exercise, and lifestyle modification.                Nutrition Goals Re-Evaluation:  Nutrition Goals Re-Evaluation     Row Name 03/13/23 1110             Goals   Comment A1c 6.9, lipids WNL (LDL <55), lipoprotein A 168.9, AST 373, ALT 49, GFR 45, Cr 1.26       Expected Outcome Patient reports following a vegetarian diet lifelong; she does eat dairy products. She reports that she has reduced carbohydrate portions over the last ~6 months and this has helped her A1c. She will benefit from participation in intensive cardiac rehab for nutrition, exercise, and lifestyle modification.                Nutrition Goals Discharge (Final Nutrition Goals Re-Evaluation):  Nutrition Goals Re-Evaluation - 03/13/23 1110       Goals   Comment A1c 6.9, lipids WNL (LDL <55),  lipoprotein A 168.9, AST 373, ALT 49, GFR 45, Cr 1.26    Expected Outcome Patient reports following a vegetarian diet lifelong; she does eat dairy products. She reports that she has reduced carbohydrate portions over the last ~6 months and this has helped her A1c. She will benefit from participation in intensive cardiac rehab for nutrition, exercise, and lifestyle modification.             Psychosocial: Target Goals: Acknowledge presence or absence of significant depression and/or stress, maximize coping skills, provide positive support system. Participant is able to verbalize types and ability to use techniques and skills needed for reducing stress and depression.  Initial Review & Psychosocial Screening:  Initial Psych Review & Screening - 03/07/23 1156       Initial Review   Current issues with None Identified      Family Dynamics   Good Support System? Yes    Comments Pt  has family at home for support      Barriers   Psychosocial barriers to participate in program There are no identifiable barriers or psychosocial needs.      Screening Interventions   Interventions Encouraged to exercise;Provide feedback about the scores to participant             Quality of Life Scores:  Quality of Life - 03/07/23 1241       Quality of Life   Select Quality of Life      Quality of Life Scores   Health/Function Pre 29.5 %    Socioeconomic Pre 30 %    Psych/Spiritual Pre 30 %    Family Pre 30 %    GLOBAL Pre 29.79 %            Scores of 19 and below usually indicate a poorer quality of life in these areas.  A difference of  2-3 points is a clinically meaningful difference.  A difference of 2-3 points in the total score of the Quality of Life Index has been associated with significant improvement in overall quality of life, self-image, physical symptoms, and general health in studies assessing change in quality of life.  PHQ-9: Review Flowsheet  More data exists      03/07/2023 02/19/2023 11/19/2022 09/19/2022 08/15/2022  Depression screen PHQ 2/9  Decreased Interest 0 0 0 0 0  Down, Depressed, Hopeless 0 0 0 0 0  PHQ - 2 Score 0 0 0 0 0  Altered sleeping 0 - - - -  Tired, decreased energy 0 - - - -  Change in appetite 0 - - - -  Feeling bad or failure about yourself  0 - - - -  Trouble concentrating 0 - - - -  Moving slowly or fidgety/restless 0 - - - -  Suicidal thoughts 0 - - - -  PHQ-9 Score 0 - - - -  Difficult doing work/chores Not difficult at all - - - -   Interpretation of Total Score  Total Score Depression Severity:  1-4 = Minimal depression, 5-9 = Mild depression, 10-14 = Moderate depression, 15-19 = Moderately severe depression, 20-27 = Severe depression   Psychosocial Evaluation and Intervention:   Psychosocial Re-Evaluation:  Psychosocial Re-Evaluation     Row Name 03/14/23 1410 03/21/23 1555 04/16/23 1526          Psychosocial Re-Evaluation   Current issues with None Identified None Identified None Identified     Interventions Encouraged to attend Cardiac Rehabilitation for the exercise Encouraged to attend Cardiac  Rehabilitation for the exercise Encouraged to attend Cardiac Rehabilitation for the exercise     Continue Psychosocial Services  No Follow up required No Follow up required No Follow up required              Psychosocial Discharge (Final Psychosocial Re-Evaluation):  Psychosocial Re-Evaluation - 04/16/23 1526       Psychosocial Re-Evaluation   Current issues with None Identified    Interventions Encouraged to attend Cardiac Rehabilitation for the exercise    Continue Psychosocial Services  No Follow up required             Vocational Rehabilitation: Provide vocational rehab assistance to qualifying candidates.   Vocational Rehab Evaluation & Intervention:  Vocational Rehab - 03/07/23 1253       Initial Vocational Rehab Evaluation & Intervention   Assessment shows need for Vocational Rehabilitation No   Pt is retired and does not have a need for vocational rehab            Education: Education Goals: Education classes will be provided on a weekly basis, covering required topics. Participant will state understanding/return demonstration of topics presented.    Education     Row Name 03/13/23 1500     Education   Cardiac Education Topics [facility]   Secondary school teacher School   Educator Dietitian   Weekly Topic Tasty Appetizers and Snacks   Instruction Review Code 1- Verbalizes Understanding   Class Start Time 1145   Class Stop Time 1223   Class Time Calculation (min) 38 min    Row Name 03/15/23 1300     Education   Cardiac Education Topics [facility]   Nurse, children's   Educator Dietitian   Select Nutrition   Nutrition Calorie Density   Instruction Review Code 1- Verbalizes Understanding   Class Start Time 1145    Class Stop Time 1225   Class Time Calculation (min) 40 min    Row Name 03/18/23 1400     Education   Cardiac Education Topics [facility]   Nurse, children's   Educator Dietitian   Select Nutrition   Nutrition Nutrition Action Plan   Instruction Review Code 1- Verbalizes Understanding   Class Start Time 1145   Class Stop Time 1219   Class Time Calculation (min) 34 min    Row Name 03/20/23 1300     Education   Cardiac Education Topics [facility]   Customer service manager   Weekly Topic Efficiency Cooking - Meals in a Snap   Instruction Review Code 1- Verbalizes Understanding   Class Start Time 1140   Class Stop Time 1220   Class Time Calculation (min) 40 min    Row Name 03/22/23 1400     Education   Cardiac Education Topics [facility]   Psychologist, forensic Exercise Education   Exercise Education Move It!   Instruction Review Code 1- Verbalizes Understanding   Class Start Time 1150   Class Stop Time 1230   Class Time Calculation (min) 40 min    Row Name 03/25/23 1600     Education   Cardiac Education Topics [facility]   Geographical information systems officer Psychosocial   Psychosocial Workshop Focused Goals,  Sustainable Changes   Instruction Review Code 1- Verbalizes Understanding   Class Start Time 1150   Class Stop Time 1225   Class Time Calculation (min) 35 min    Row Name 03/27/23 1300     Education   Cardiac Education Topics Pritikin   Orthoptist   Educator Dietitian   Weekly Topic Comforting Weekend Breakfasts   Instruction Review Code 1- Verbalizes Understanding   Class Start Time 1140   Class Stop Time 1212   Class Time Calculation (min) 32 min    Row Name 03/29/23 1400     Education   Cardiac Education Topics Pritikin   Technical sales engineer Education   General Education Hypertension and Heart Disease   Instruction Review Code 1- Verbalizes Understanding   Class Start Time 1147   Class Stop Time 1220   Class Time Calculation (min) 33 min    Row Name 04/01/23 1300     Education   Cardiac Education Topics Pritikin   Select Core Videos     Core Videos   Educator Nurse   Select Exercise Education   Exercise Education Biomechanial Limitations   Instruction Review Code 1- Verbalizes Understanding   Class Start Time 1156   Class Stop Time 1232   Class Time Calculation (min) 36 min    Row Name 04/03/23 1300     Education   Cardiac Education Topics Pritikin   Customer service manager   Weekly Topic Comforting Weekend Breakfasts   Instruction Review Code 1- Verbalizes Understanding   Class Start Time 1135   Class Stop Time 1223   Class Time Calculation (min) 48 min    Row Name 04/05/23 1400     Education   Cardiac Education Topics Pritikin   Licensed conveyancer Nutrition   Nutrition Dining Out - Part 1   Instruction Review Code 1- Verbalizes Understanding   Class Start Time 1145   Class Stop Time 1222   Class Time Calculation (min) 37 min    Row Name 04/10/23 1200     Education   Cardiac Education Topics Pritikin   Customer service manager   Weekly Topic Fast Evening Meals   Instruction Review Code 1- Verbalizes Understanding   Class Start Time 1140   Class Stop Time 1215   Class Time Calculation (min) 35 min    Row Name 04/12/23 1000     Education   Cardiac Education Topics Pritikin   Ship broker   Select Nutrition   Nutrition Vitamins and Minerals   Instruction Review Code 1Trenton Gammon Understanding    Row Name 04/15/23 1600     Education   Cardiac Education Topics  Pritikin     Workshops   Educator Exercise Physiologist   Select Psychosocial   Psychosocial Workshop Healthy Sleep for a Healthy Heart   Instruction Review Code 1- Verbalizes Understanding   Class Start Time 1145   Class Stop Time 1233   Class Time Calculation (min) 48 min            Core Videos: Exercise    Move It!  Clinical staff conducted  group or individual video education with verbal and written material and guidebook.  Patient learns the recommended Pritikin exercise program. Exercise with the goal of living a long, healthy life. Some of the health benefits of exercise include controlled diabetes, healthier blood pressure levels, improved cholesterol levels, improved heart and lung capacity, improved sleep, and better body composition. Everyone should speak with their doctor before starting or changing an exercise routine.  Biomechanical Limitations Clinical staff conducted group or individual video education with verbal and written material and guidebook.  Patient learns how biomechanical limitations can impact exercise and how we can mitigate and possibly overcome limitations to have an impactful and balanced exercise routine.  Body Composition Clinical staff conducted group or individual video education with verbal and written material and guidebook.  Patient learns that body composition (ratio of muscle mass to fat mass) is a key component to assessing overall fitness, rather than body weight alone. Increased fat mass, especially visceral belly fat, can put [place] at increased risk for metabolic syndrome, type 2 diabetes, heart disease, and even death. It is recommended to combine diet and exercise (cardiovascular and resistance training) to improve your body composition. Seek guidance from your physician and exercise physiologist before implementing an exercise routine.  Exercise Action Plan Clinical staff conducted group or individual video education with verbal and written  material and guidebook.  Patient learns the recommended strategies to achieve and enjoy long-term exercise adherence, including variety, self-motivation, self-efficacy, and positive decision making. Benefits of exercise include fitness, good health, weight management, more energy, better sleep, less stress, and overall well-being.  Medical   Heart Disease Risk Reduction Clinical staff conducted group or individual video education with verbal and written material and guidebook.  Patient learns our heart is our most vital organ as it circulates oxygen, nutrients, white blood cells, and hormones throughout the entire body, and carries waste away. Data supports a plant-based eating plan like the Pritikin Program for its effectiveness in slowing progression of and reversing heart disease. The video provides a number of recommendations to address heart disease.   Metabolic Syndrome and Belly Fat  Clinical staff conducted group or individual video education with verbal and written material and guidebook.  Patient learns what metabolic syndrome is, how it leads to heart disease, and how one can reverse it and keep it from coming back. You have metabolic syndrome if you have 3 of the following 5 criteria: abdominal obesity, high blood pressure, high triglycerides, low HDL cholesterol, and high blood sugar.  Hypertension and Heart Disease Clinical staff conducted group or individual video education with verbal and written material and guidebook.  Patient learns that high blood pressure, or hypertension, is very common in the [place]. Hypertension is largely due to excessive salt intake, but other important risk factors include being overweight, physical inactivity, drinking too much alcohol, smoking, and not eating enough potassium from fruits and vegetables. High blood pressure is a leading risk factor for heart attack, stroke, congestive heart failure, dementia, kidney failure, and premature death.  Long-term effects of excessive salt intake include stiffening of the arteries and thickening of heart muscle and organ damage. Recommendations include ways to reduce hypertension and the risk of heart disease.  Diseases of Our Time - Focusing on Diabetes Clinical staff conducted group or individual video education with verbal and written material and guidebook.  Patient learns why the best way to stop diseases of our time is prevention, through food and other lifestyle changes. Medicine (such as prescription  pills and surgeries) is often only a Band-Aid on the problem, not a long-term solution. Most common diseases of our time include obesity, type 2 diabetes, hypertension, heart disease, and cancer. The Pritikin Program is recommended and has been proven to help reduce, reverse, and/or prevent the damaging effects of metabolic syndrome.  Nutrition   Overview of the Pritikin Eating Plan  Clinical staff conducted group or individual video education with verbal and written material and guidebook.  Patient learns about the Pritikin Eating Plan for disease risk reduction. The Pritikin Eating Plan emphasizes a wide variety of unrefined, minimally-processed carbohydrates, like fruits, vegetables, whole grains, and legumes. Go, Caution, and Stop food choices are explained. Plant-based and lean animal proteins are emphasized. Rationale provided for low sodium intake for blood pressure control, low added sugars for blood sugar stabilization, and low added fats and oils for coronary artery disease risk reduction and weight management.  Calorie Density  Clinical staff conducted group or individual video education with verbal and written material and guidebook.  Patient learns about calorie density and how it impacts the Pritikin Eating Plan. Knowing the characteristics of the food you choose will help you decide whether those foods will lead to weight gain or weight loss, and whether you want to consume more or  less of them. Weight loss is usually a side effect of the Pritikin Eating Plan because of its focus on low calorie-dense foods.  Label Reading  Clinical staff conducted group or individual video education with verbal and written material and guidebook.  Patient learns about the Pritikin recommended label reading guidelines and corresponding recommendations regarding calorie density, added sugars, sodium content, and whole grains.  Dining Out - Part 1  Clinical staff conducted group or individual video education with verbal and written material and guidebook.  Patient learns that restaurant meals can be sabotaging because they can be so high in calories, fat, sodium, and/or sugar. Patient learns recommended strategies on how to positively address this and avoid unhealthy pitfalls.  Facts on Fats  Clinical staff conducted group or individual video education with verbal and written material and guidebook.  Patient learns that lifestyle modifications can be just as effective, if not more so, as many medications for lowering your risk of heart disease. A Pritikin lifestyle can help to reduce your risk of inflammation and atherosclerosis (cholesterol build-up, or plaque, in the artery walls). Lifestyle interventions such as dietary choices and physical activity address the cause of atherosclerosis. A review of the types of fats and their impact on blood cholesterol levels, along with dietary recommendations to reduce fat intake is also included.  Nutrition Action Plan  Clinical staff conducted group or individual video education with verbal and written material and guidebook.  Patient learns how to incorporate Pritikin recommendations into their lifestyle. Recommendations include planning and keeping personal health goals in mind as an important part of their success.  Healthy Mind-Set    Healthy Minds, Bodies, Hearts  Clinical staff conducted group or individual video education with verbal and written  material and guidebook.  Patient learns how to identify when they are stressed. Video will discuss the impact of that stress, as well as the many benefits of stress management. Patient will also be introduced to stress management techniques. The way we think, act, and feel has an impact on our hearts.  How Our Thoughts Can Heal Our Hearts  Clinical staff conducted group or individual video education with verbal and written material and guidebook.  Patient learns that  negative thoughts can cause depression and anxiety. This can result in negative lifestyle behavior and serious health problems. Cognitive behavioral therapy is an effective method to help control our thoughts in order to change and improve our emotional outlook.  Additional Videos:  Exercise    Improving Performance  Clinical staff conducted group or individual video education with verbal and written material and guidebook.  Patient learns to use a non-linear approach by alternating intensity levels and lengths of time spent exercising to help burn more calories and lose more body fat. Cardiovascular exercise helps improve heart health, metabolism, hormonal balance, blood sugar control, and recovery from fatigue. Resistance training improves strength, endurance, balance, coordination, reaction time, metabolism, and muscle mass. Flexibility exercise improves circulation, posture, and balance. Seek guidance from your physician and exercise physiologist before implementing an exercise routine and learn your capabilities and proper form for all exercise.  Introduction to Yoga  Clinical staff conducted group or individual video education with verbal and written material and guidebook.  Patient learns about yoga, a discipline of the coming together of mind, breath, and body. The benefits of yoga include improved flexibility, improved range of motion, better posture and core strength, increased lung function, weight loss, and positive  self-image. Yoga's heart health benefits include lowered blood pressure, healthier heart rate, decreased cholesterol and triglyceride levels, improved immune function, and reduced stress. Seek guidance from your physician and exercise physiologist before implementing an exercise routine and learn your capabilities and proper form for all exercise.  Medical   Aging: Enhancing Your Quality of Life  Clinical staff conducted group or individual video education with verbal and written material and guidebook.  Patient learns key strategies and recommendations to stay in good physical health and enhance quality of life, such as prevention strategies, having an advocate, securing a Health Care Proxy and Power of Attorney, and keeping a list of medications and system for tracking them. It also discusses how to avoid risk for bone loss.  Biology of Weight Control  Clinical staff conducted group or individual video education with verbal and written material and guidebook.  Patient learns that weight gain occurs because we consume more calories than we burn (eating more, moving less). Even if your body weight is normal, you may have higher ratios of fat compared to muscle mass. Too much body fat puts you at increased risk for cardiovascular disease, heart attack, stroke, type 2 diabetes, and obesity-related cancers. In addition to exercise, following the Pritikin Eating Plan can help reduce your risk.  Decoding Lab Results  Clinical staff conducted group or individual video education with verbal and written material and guidebook.  Patient learns that lab test reflects one measurement whose values change over time and are influenced by many factors, including medication, stress, sleep, exercise, food, hydration, pre-existing medical conditions, and more. It is recommended to use the knowledge from this video to become more involved with your lab results and evaluate your numbers to speak with your  doctor.   Diseases of Our Time - Overview  Clinical staff conducted group or individual video education with verbal and written material and guidebook.  Patient learns that according to the CDC, 50% to 70% of chronic diseases (such as obesity, type 2 diabetes, elevated lipids, hypertension, and heart disease) are avoidable through lifestyle improvements including healthier food choices, listening to satiety cues, and increased physical activity.  Sleep Disorders Clinical staff conducted group or individual video education with verbal and written material and guidebook.  Patient learns  how good quality and duration of sleep are important to overall health and well-being. Patient also learns about sleep disorders and how they impact health along with recommendations to address them, including discussing with a physician.  Nutrition  Dining Out - Part 2 Clinical staff conducted group or individual video education with verbal and written material and guidebook.  Patient learns how to plan ahead and communicate in order to maximize their dining experience in a healthy and nutritious manner. Included are recommended food choices based on the type of restaurant the patient is visiting.   Fueling a Banker conducted group or individual video education with verbal and written material and guidebook.  There is a strong connection between our food choices and our health. Diseases like obesity and type 2 diabetes are very prevalent and are in large-part due to lifestyle choices. The Pritikin Eating Plan provides plenty of food and hunger-curbing satisfaction. It is easy to follow, affordable, and helps reduce health risks.  Menu Workshop  Clinical staff conducted group or individual video education with verbal and written material and guidebook.  Patient learns that restaurant meals can sabotage health goals because they are often packed with calories, fat, sodium, and sugar.  Recommendations include strategies to plan ahead and to communicate with the manager, chef, or server to help order a healthier meal.  Planning Your Eating Strategy  Clinical staff conducted group or individual video education with verbal and written material and guidebook.  Patient learns about the Pritikin Eating Plan and its benefit of reducing the risk of disease. The Pritikin Eating Plan does not focus on calories. Instead, it emphasizes high-quality, nutrient-rich foods. By knowing the characteristics of the foods, we choose, we can determine their calorie density and make informed decisions.  Targeting Your Nutrition Priorities  Clinical staff conducted group or individual video education with verbal and written material and guidebook.  Patient learns that lifestyle habits have a tremendous impact on disease risk and progression. This video provides eating and physical activity recommendations based on your personal health goals, such as reducing LDL cholesterol, losing weight, preventing or controlling type 2 diabetes, and reducing high blood pressure.  Vitamins and Minerals  Clinical staff conducted group or individual video education with verbal and written material and guidebook.  Patient learns different ways to obtain key vitamins and minerals, including through a recommended healthy diet. It is important to discuss all supplements you take with your doctor.   Healthy Mind-Set    Smoking Cessation  Clinical staff conducted group or individual video education with verbal and written material and guidebook.  Patient learns that cigarette smoking and tobacco addiction pose a serious health risk which affects millions of people. Stopping smoking will significantly reduce the risk of heart disease, lung disease, and many forms of cancer. Recommended strategies for quitting are covered, including working with your doctor to develop a successful plan.  Culinary   Becoming a Corporate investment banker conducted group or individual video education with verbal and written material and guidebook.  Patient learns that cooking at home can be healthy, cost-effective, quick, and puts them in control. Keys to cooking healthy recipes will include looking at your recipe, assessing your equipment needs, planning ahead, making it simple, choosing cost-effective seasonal ingredients, and limiting the use of added fats, salts, and sugars.  Cooking - Breakfast and Snacks  Clinical staff conducted group or individual video education with verbal and written material and guidebook.  Patient learns  how important breakfast is to satiety and nutrition through the entire day. Recommendations include key foods to eat during breakfast to help stabilize blood sugar levels and to prevent overeating at meals later in the day. Planning ahead is also a key component.  Cooking - Educational psychologist conducted group or individual video education with verbal and written material and guidebook.  Patient learns eating strategies to improve overall health, including an approach to cook more at home. Recommendations include thinking of animal protein as a side on your plate rather than center stage and focusing instead on lower calorie dense options like vegetables, fruits, whole grains, and plant-based proteins, such as beans. Making sauces in large quantities to freeze for later and leaving the skin on your vegetables are also recommended to maximize your experience.  Cooking - Healthy Salads and Dressing Clinical staff conducted group or individual video education with verbal and written material and guidebook.  Patient learns that vegetables, fruits, whole grains, and legumes are the foundations of the Pritikin Eating Plan. Recommendations include how to incorporate each of these in flavorful and healthy salads, and how to create homemade salad dressings. Proper handling of ingredients is also covered.  Cooking - Soups and [employer] - Soups and Desserts Clinical staff conducted group or individual video education with verbal and written material and guidebook.  Patient learns that Pritikin soups and desserts make for easy, nutritious, and delicious snacks and meal components that are low in sodium, fat, sugar, and calorie density, while high in vitamins, minerals, and filling fiber. Recommendations include simple and healthy ideas for soups and desserts.   Overview     The Pritikin Solution Program Overview Clinical staff conducted group or individual video education with verbal and written material and guidebook.  Patient learns that the results of the Pritikin Program have been documented in more than 100 articles published in peer-reviewed journals, and the benefits include reducing risk factors for (and, in some cases, even reversing) high cholesterol, high blood pressure, type 2 diabetes, obesity, and more! An overview of the three key pillars of the Pritikin Program will be covered: eating well, doing regular exercise, and having a healthy mind-set.  WORKSHOPS  Exercise: Exercise Basics: Building Your Action Plan Clinical staff led group instruction and group discussion with PowerPoint presentation and patient guidebook. To enhance the learning environment the use of posters, models and videos may be added. At the conclusion of this workshop, patients will comprehend the difference between physical activity and exercise, as well as the benefits of incorporating both, into their routine. Patients will understand the FITT (Frequency, Intensity, Time, and Type) principle and how to use it to build an exercise action plan. In addition, safety concerns and other considerations for exercise and cardiac rehab will be addressed by the presenter. The purpose of this lesson is to promote a comprehensive and effective weekly exercise routine in order to improve patients' overall level of  fitness.   Managing Heart Disease: Your Path to a Healthier Heart Clinical staff led group instruction and group discussion with PowerPoint presentation and patient guidebook. To enhance the learning environment the use of posters, models and videos may be added.At the conclusion of this workshop, patients will understand the anatomy and physiology of the heart. Additionally, they will understand how Pritikin's three pillars impact the risk factors, the progression, and the management of heart disease.  The purpose of this lesson is to provide a high-level overview of the heart, heart  disease, and how the Pritikin lifestyle positively impacts risk factors.  Exercise Biomechanics Clinical staff led group instruction and group discussion with PowerPoint presentation and patient guidebook. To enhance the learning environment the use of posters, models and videos may be added. Patients will learn how the structural parts of their bodies function and how these functions impact their daily activities, movement, and exercise. Patients will learn how to promote a neutral spine, learn how to manage pain, and identify ways to improve their physical movement in order to promote healthy living. The purpose of this lesson is to expose patients to common physical limitations that impact physical activity. Participants will learn practical ways to adapt and manage aches and pains, and to minimize their effect on regular exercise. Patients will learn how to maintain good posture while sitting, walking, and lifting.  Balance Training and Fall Prevention  Clinical staff led group instruction and group discussion with PowerPoint presentation and patient guidebook. To enhance the learning environment the use of posters, models and videos may be added. At the conclusion of this workshop, patients will understand the importance of their sensorimotor skills (vision, proprioception, and the vestibular system)  in maintaining their ability to balance as they age. Patients will apply a variety of balancing exercises that are appropriate for their current level of function. Patients will understand the common causes for poor balance, possible solutions to these problems, and ways to modify their physical environment in order to minimize their fall risk. The purpose of this lesson is to teach patients about the importance of maintaining balance as they age and ways to minimize their risk of falling.  WORKSHOPS   Nutrition:  Fueling a Ship broker led group instruction and group discussion with PowerPoint presentation and patient guidebook. To enhance the learning environment the use of posters, models and videos may be added. Patients will review the foundational principles of the Pritikin Eating Plan and understand what constitutes a serving size in each of the food groups. Patients will also learn Pritikin-friendly foods that are better choices when away from home and review make-ahead meal and snack options. Calorie density will be reviewed and applied to three nutrition priorities: weight maintenance, weight loss, and weight gain. The purpose of this lesson is to reinforce (in a group setting) the key concepts around what patients are recommended to eat and how to apply these guidelines when away from home by planning and selecting Pritikin-friendly options. Patients will understand how calorie density may be adjusted for different weight management goals.  Mindful Eating  Clinical staff led group instruction and group discussion with PowerPoint presentation and patient guidebook. To enhance the learning environment the use of posters, models and videos may be added. Patients will briefly review the concepts of the Pritikin Eating Plan and the importance of low-calorie dense foods. The concept of mindful eating will be introduced as well as the importance of paying attention to internal hunger  signals. Triggers for non-hunger eating and techniques for dealing with triggers will be explored. The purpose of this lesson is to provide patients with the opportunity to review the basic principles of the Pritikin Eating Plan, discuss the value of eating mindfully and how to measure internal cues of hunger and fullness using the Hunger Scale. Patients will also discuss reasons for non-hunger eating and learn strategies to use for controlling emotional eating.  Targeting Your Nutrition Priorities Clinical staff led group instruction and group discussion with PowerPoint presentation and patient guidebook. To enhance the  learning environment the use of posters, models and videos may be added. Patients will learn how to determine their genetic susceptibility to disease by reviewing their family history. Patients will gain insight into the importance of diet as part of an overall healthy lifestyle in mitigating the impact of genetics and other environmental insults. The purpose of this lesson is to provide patients with the opportunity to assess their personal nutrition priorities by looking at their family history, their own health history and current risk factors. Patients will also be able to discuss ways of prioritizing and modifying the Pritikin Eating Plan for their highest risk areas  Menu  Clinical staff led group instruction and group discussion with PowerPoint presentation and patient guidebook. To enhance the learning environment the use of posters, models and videos may be added. Using menus brought in from [employer], or printed from [employer], patients will apply the Pritikin dining out guidelines that were presented in the [employer] video. Patients will also be able to practice these guidelines in a variety of provided scenarios. The purpose of this lesson is to provide patients with the opportunity to practice hands-on learning of the Pritikin Dining Out guidelines  with actual menus and practice scenarios.  Label Reading Clinical staff led group instruction and group discussion with PowerPoint presentation and patient guidebook. To enhance the learning environment the use of posters, models and videos may be added. Patients will review and discuss the Pritikin label reading guidelines presented in [employer]'s Label Reading Educational series video. Using fool labels brought in from local grocery stores and markets, patients will apply the label reading guidelines and determine if the packaged food meet the Pritikin guidelines. The purpose of this lesson is to provide patients with the opportunity to review, discuss, and practice hands-on learning of the Pritikin Label Reading guidelines with actual packaged food labels. Cooking School  [facility]'s [employer] are designed to teach patients ways to prepare quick, simple, and affordable recipes at home. The importance of nutrition's role in chronic disease risk reduction is reflected in its emphasis in the overall Pritikin program. By learning how to prepare essential core Pritikin Eating Plan recipes, patients will increase control over what they eat; be able to customize the flavor of foods without the use of added salt, sugar, or fat; and improve the quality of the food they consume. By learning a set of core recipes which are easily assembled, quickly prepared, and affordable, patients are more likely to prepare more healthy foods at home. These workshops focus on convenient breakfasts, simple entres, side dishes, and desserts which can be prepared with minimal effort and are consistent with nutrition recommendations for cardiovascular risk reduction. Cooking [employer] are taught by a Armed forces logistics/support/administrative officer (RD) who has been trained by the [employer]. The chef or RD has a clear understanding of the importance of minimizing - if not completely eliminating - added fat, sugar, and  sodium in recipes. Throughout the series of Cooking School Workshop sessions, patients will learn about healthy ingredients and efficient methods of cooking to build confidence in their capability to prepare    Cooking School weekly topics:  Adding Flavor- Sodium-Free  Fast and Healthy Breakfasts  Powerhouse Plant-Based Proteins  Satisfying Salads and Dressings  Simple Sides and Sauces  International Cuisine-Spotlight on the [employer] Zones  Delicious Desserts  Savory Soups  [employer] - Meals in a Snap  Tasty Appetizers and Snacks  Comforting Weekend Breakfasts  One-Pot Wonders   Fast Evening Meals  Landscape architect Your Pritikin Plate  WORKSHOPS   Healthy Mindset (Psychosocial):  Focused Goals, Sustainable Changes Clinical staff led group instruction and group discussion with PowerPoint presentation and patient guidebook. To enhance the learning environment the use of posters, models and videos may be added. Patients will be able to apply effective goal setting strategies to establish at least one personal goal, and then take consistent, meaningful action toward that goal. They will learn to identify common barriers to achieving personal goals and develop strategies to overcome them. Patients will also gain an understanding of how our mind-set can impact our ability to achieve goals and the importance of cultivating a positive and growth-oriented mind-set. The purpose of this lesson is to provide patients with a deeper understanding of how to set and achieve personal goals, as well as the tools and strategies needed to overcome common obstacles which may arise along the way.  From Head to Heart: The Power of a Healthy Outlook  Clinical staff led group instruction and group discussion with PowerPoint presentation and patient guidebook. To enhance the learning environment the use of posters, models and videos may be added. Patients will be able to recognize and  describe the impact of emotions and mood on physical health. They will discover the importance of self-care and explore self-care practices which may work for them. Patients will also learn how to utilize the 4 C's to cultivate a healthier outlook and better manage stress and challenges. The purpose of this lesson is to demonstrate to patients how a healthy outlook is an essential part of maintaining good health, especially as they continue their cardiac rehab journey.  Healthy Sleep for a Healthy Heart Clinical staff led group instruction and group discussion with PowerPoint presentation and patient guidebook. To enhance the learning environment the use of posters, models and videos may be added. At the conclusion of this workshop, patients will be able to demonstrate knowledge of the importance of sleep to overall health, well-being, and quality of life. They will understand the symptoms of, and treatments for, common sleep disorders. Patients will also be able to identify daytime and nighttime behaviors which impact sleep, and they will be able to apply these tools to help manage sleep-related challenges. The purpose of this lesson is to provide patients with a general overview of sleep and outline the importance of quality sleep. Patients will learn about a few of the most common sleep disorders. Patients will also be introduced to the concept of "sleep hygiene," and discover ways to self-manage certain sleeping problems through simple daily behavior changes. Finally, the workshop will motivate patients by clarifying the links between quality sleep and their goals of heart-healthy living.   Recognizing and Reducing Stress Clinical staff led group instruction and group discussion with PowerPoint presentation and patient guidebook. To enhance the learning environment the use of posters, models and videos may be added. At the conclusion of this workshop, patients will be able to understand the types of stress  reactions, differentiate between acute and chronic stress, and recognize the impact that chronic stress has on their health. They will also be able to apply different coping mechanisms, such as reframing negative self-talk. Patients will have the opportunity to practice a variety of stress management techniques, such as deep abdominal breathing, progressive muscle relaxation, and/or guided imagery.  The purpose of this lesson is to educate patients on the role of stress in their lives and to provide  healthy techniques for coping with it.  Learning Barriers/Preferences:  Learning Barriers/Preferences - 03/07/23 1241       Learning Barriers/Preferences   Learning Barriers Sight    Learning Preferences Pictoral;Written Material;Computer/Internet             Education Topics:  Knowledge Questionnaire Score:  Knowledge Questionnaire Score - 03/07/23 1243       Knowledge Questionnaire Score   Pre Score 18/24             Core Components/Risk Factors/Patient Goals at Admission:  Personal Goals and Risk Factors at Admission - 03/07/23 1244       Core Components/Risk Factors/Patient Goals on Admission    Weight Management Yes    Diabetes Yes    Intervention Provide education about signs/symptoms and action to take for hypo/hyperglycemia.;Provide education about proper nutrition, including hydration, and aerobic/resistive exercise prescription along with prescribed medications to achieve blood glucose in normal ranges: Fasting glucose 65-99 mg/dL    Expected Outcomes Short Term: Participant verbalizes understanding of the signs/symptoms and immediate care of hyper/hypoglycemia, proper foot care and importance of medication, aerobic/resistive exercise and nutrition plan for blood glucose control.;Long Term: Attainment of HbA1C < 7%.    Hypertension Yes    Intervention Provide education on lifestyle modifcations including regular physical activity/exercise, weight management, moderate  sodium restriction and increased consumption of fresh fruit, vegetables, and low fat dairy, alcohol moderation, and smoking cessation.;Monitor prescription use compliance.    Expected Outcomes Short Term: Continued assessment and intervention until BP is < 140/9mm HG in hypertensive participants. < 130/61mm HG in hypertensive participants with diabetes, heart failure or chronic kidney disease.;Long Term: Maintenance of blood pressure at goal levels.    Lipids Yes    Intervention Provide education and support for participant on nutrition & aerobic/resistive exercise along with prescribed medications to achieve LDL 70mg , HDL >40mg .    Expected Outcomes Short Term: Participant states understanding of desired cholesterol values and is compliant with medications prescribed. Participant is following exercise prescription and nutrition guidelines.;Long Term: Cholesterol controlled with medications as prescribed, with individualized exercise RX and with personalized nutrition plan. Value goals: LDL < 70mg , HDL > 40 mg.    Personal Goal Other Yes    Personal Goal Short: get up from flooron her own, Long term: safely be able to garden,walking on treadmill again    Intervention Will continue to monitor pt and progress workloads as tolerated without sign or symptom    Expected Outcomes Pt will achieve her goals and gain strength             Core Components/Risk Factors/Patient Goals Review:   Goals and Risk Factor Review     Row Name 03/14/23 1411 03/21/23 1556 04/16/23 1527         Core Components/Risk Factors/Patient Goals Review   Personal Goals Review Weight Management/Obesity;Hypertension;Lipids;Diabetes Weight Management/Obesity;Hypertension;Lipids;Diabetes Weight Management/Obesity;Hypertension;Lipids;Diabetes     Review Smita started intensive cardiac rehab on 03/13/23 and did well with exercise. Vital signs and CBg's were stable Smita started intensive cardiac rehab on 03/13/23 and is doing  well with exercise.. Vital signs and CBg's have been stable Smita is doing well with exercise at intensive cardiac rehab. Vital signs and CBg's have been stable     Expected Outcomes Smita will continue to participate in intensive cardiac rehab for exercise, nutrition and lifestyle modifications Smita will continue to participate in intensive cardiac rehab for exercise, nutrition and lifestyle modifications Smita will continue to participate in intensive cardiac rehab for  exercise, nutrition and lifestyle modifications              Core Components/Risk Factors/Patient Goals at Discharge (Final Review):   Goals and Risk Factor Review - 04/16/23 1527       Core Components/Risk Factors/Patient Goals Review   Personal Goals Review Weight Management/Obesity;Hypertension;Lipids;Diabetes    Review Smita is doing well with exercise at intensive cardiac rehab. Vital signs and CBg's have been stable    Expected Outcomes Smita will continue to participate in intensive cardiac rehab for exercise, nutrition and lifestyle modifications             ITP Comments:  ITP Comments     Row Name 03/07/23 1304 03/14/23 1409 03/21/23 1554 04/16/23 1525     ITP Comments Dr. Armanda Magic medical director. Introduction to [facility] education program/intensive cardiac rehab. Initial Orientation packet reviewed with patient 30 Day ITP Review. Smita started intensive cardiac rehab on 03/13/23 and did well with exercise 30 Day ITP Review. Smita started intensive cardiac rehab on 03/13/23 and is off to a good start to exercise. 30 Day ITP Review. Smita has good attendance and participation in  intensive cardiac rehab.             Comments: See ITP Comments

## 2023-04-17 ENCOUNTER — Encounter (HOSPITAL_COMMUNITY)
Admission: RE | Admit: 2023-04-17 | Discharge: 2023-04-17 | Disposition: A | Discharge status: Home / Self Care | Payer: BC Managed Care – PPO | Source: Ambulatory Visit | Attending: Cardiovascular Disease | Admitting: Cardiovascular Disease

## 2023-04-17 DIAGNOSIS — Z955 Presence of coronary angioplasty implant and graft: Secondary | ICD-10-CM | POA: Diagnosis not present

## 2023-04-17 DIAGNOSIS — I2121 ST elevation (STEMI) myocardial infarction involving left circumflex coronary artery: Secondary | ICD-10-CM

## 2023-04-19 ENCOUNTER — Encounter (HOSPITAL_COMMUNITY)
Admission: RE | Admit: 2023-04-19 | Discharge: 2023-04-19 | Disposition: A | Discharge status: Home / Self Care | Payer: BC Managed Care – PPO | Source: Ambulatory Visit | Attending: Cardiovascular Disease | Admitting: Cardiovascular Disease

## 2023-04-19 DIAGNOSIS — Z955 Presence of coronary angioplasty implant and graft: Secondary | ICD-10-CM | POA: Diagnosis not present

## 2023-04-19 DIAGNOSIS — I2121 ST elevation (STEMI) myocardial infarction involving left circumflex coronary artery: Secondary | ICD-10-CM

## 2023-04-22 ENCOUNTER — Encounter (HOSPITAL_COMMUNITY)
Admission: RE | Admit: 2023-04-22 | Discharge: 2023-04-22 | Disposition: A | Discharge status: Home / Self Care | Payer: BC Managed Care – PPO | Source: Ambulatory Visit | Attending: Cardiovascular Disease | Admitting: Cardiovascular Disease

## 2023-04-22 DIAGNOSIS — I2121 ST elevation (STEMI) myocardial infarction involving left circumflex coronary artery: Secondary | ICD-10-CM

## 2023-04-22 DIAGNOSIS — Z955 Presence of coronary angioplasty implant and graft: Secondary | ICD-10-CM | POA: Diagnosis not present

## 2023-04-24 ENCOUNTER — Encounter (HOSPITAL_COMMUNITY)
Admission: RE | Admit: 2023-04-24 | Discharge: 2023-04-24 | Disposition: A | Discharge status: Home / Self Care | Payer: BC Managed Care – PPO | Source: Ambulatory Visit | Attending: Cardiovascular Disease | Admitting: Cardiovascular Disease

## 2023-04-24 DIAGNOSIS — Z955 Presence of coronary angioplasty implant and graft: Secondary | ICD-10-CM | POA: Diagnosis not present

## 2023-04-24 DIAGNOSIS — I2121 ST elevation (STEMI) myocardial infarction involving left circumflex coronary artery: Secondary | ICD-10-CM

## 2023-04-26 ENCOUNTER — Encounter (HOSPITAL_COMMUNITY)
Admission: RE | Admit: 2023-04-26 | Discharge: 2023-04-26 | Disposition: A | Discharge status: Home / Self Care | Payer: BC Managed Care – PPO | Source: Ambulatory Visit | Attending: Cardiovascular Disease | Admitting: Cardiovascular Disease

## 2023-04-26 DIAGNOSIS — Z955 Presence of coronary angioplasty implant and graft: Secondary | ICD-10-CM | POA: Diagnosis not present

## 2023-04-26 DIAGNOSIS — I2121 ST elevation (STEMI) myocardial infarction involving left circumflex coronary artery: Secondary | ICD-10-CM

## 2023-04-29 ENCOUNTER — Encounter (HOSPITAL_COMMUNITY)
Admission: RE | Admit: 2023-04-29 | Discharge: 2023-04-29 | Disposition: A | Discharge status: Home / Self Care | Payer: BC Managed Care – PPO | Source: Ambulatory Visit | Attending: Cardiovascular Disease | Admitting: Cardiovascular Disease

## 2023-04-29 DIAGNOSIS — Z955 Presence of coronary angioplasty implant and graft: Secondary | ICD-10-CM | POA: Diagnosis not present

## 2023-05-01 ENCOUNTER — Encounter (HOSPITAL_COMMUNITY)
Admission: RE | Admit: 2023-05-01 | Discharge: 2023-05-01 | Disposition: A | Discharge status: Home / Self Care | Payer: BC Managed Care – PPO | Source: Ambulatory Visit | Attending: Cardiovascular Disease | Admitting: Cardiovascular Disease

## 2023-05-01 DIAGNOSIS — I2121 ST elevation (STEMI) myocardial infarction involving left circumflex coronary artery: Secondary | ICD-10-CM

## 2023-05-01 DIAGNOSIS — Z955 Presence of coronary angioplasty implant and graft: Secondary | ICD-10-CM | POA: Diagnosis not present

## 2023-05-03 ENCOUNTER — Encounter (HOSPITAL_COMMUNITY)
Admission: RE | Admit: 2023-05-03 | Discharge: 2023-05-03 | Disposition: A | Discharge status: Home / Self Care | Payer: BC Managed Care – PPO | Source: Ambulatory Visit | Attending: Cardiovascular Disease | Admitting: Cardiovascular Disease

## 2023-05-03 DIAGNOSIS — I2121 ST elevation (STEMI) myocardial infarction involving left circumflex coronary artery: Secondary | ICD-10-CM

## 2023-05-03 DIAGNOSIS — Z955 Presence of coronary angioplasty implant and graft: Secondary | ICD-10-CM

## 2023-05-08 ENCOUNTER — Encounter (HOSPITAL_COMMUNITY)
Admission: RE | Admit: 2023-05-08 | Discharge: 2023-05-08 | Disposition: A | Discharge status: Home / Self Care | Payer: BC Managed Care – PPO | Source: Ambulatory Visit | Attending: Cardiovascular Disease | Admitting: Cardiovascular Disease

## 2023-05-08 DIAGNOSIS — I2121 ST elevation (STEMI) myocardial infarction involving left circumflex coronary artery: Secondary | ICD-10-CM

## 2023-05-08 DIAGNOSIS — Z955 Presence of coronary angioplasty implant and graft: Secondary | ICD-10-CM

## 2023-05-10 ENCOUNTER — Encounter (HOSPITAL_COMMUNITY)
Admission: RE | Admit: 2023-05-10 | Discharge: 2023-05-10 | Disposition: A | Discharge status: Home / Self Care | Payer: BC Managed Care – PPO | Source: Ambulatory Visit | Attending: Cardiovascular Disease | Admitting: Cardiovascular Disease

## 2023-05-10 VITALS — Ht 61.0 in | Wt 120.8 lb

## 2023-05-10 DIAGNOSIS — I2121 ST elevation (STEMI) myocardial infarction involving left circumflex coronary artery: Secondary | ICD-10-CM

## 2023-05-10 DIAGNOSIS — Z955 Presence of coronary angioplasty implant and graft: Secondary | ICD-10-CM

## 2023-05-13 ENCOUNTER — Encounter (HOSPITAL_COMMUNITY)
Admission: RE | Admit: 2023-05-13 | Discharge: 2023-05-13 | Disposition: A | Discharge status: Home / Self Care | Payer: BC Managed Care – PPO | Source: Ambulatory Visit | Attending: Cardiovascular Disease | Admitting: Cardiovascular Disease

## 2023-05-13 DIAGNOSIS — I2121 ST elevation (STEMI) myocardial infarction involving left circumflex coronary artery: Secondary | ICD-10-CM | POA: Diagnosis present

## 2023-05-13 DIAGNOSIS — Z955 Presence of coronary angioplasty implant and graft: Secondary | ICD-10-CM | POA: Insufficient documentation

## 2023-05-14 NOTE — Progress Notes (Signed)
Cardiac Individual Treatment Plan  Patient Details  Name: Wanda Medina MRN: [id number] Date of Birth: 04/17/1951 Referring Provider:   Flowsheet Row INTENSIVE CARDIAC REHAB ORIENT from 03/07/2023 in [facility]  Referring Provider Dr. 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 Swallow whole., Disp: 90 tablet, Rfl: 3   calcium-vitamin D (OSCAL WITH D) 500-200 MG-UNIT tablet, Take 1 tablet by mouth daily., Disp: , Rfl:    cyclobenzaprine (FLEXERIL) 5 MG tablet, Take 1 tablet (5 mg total) by mouth 2 (two) times daily as needed for muscle spasms., Disp: 180 tablet, Rfl: 3   dapagliflozin propanediol (FARXIGA) 10 MG TABS tablet, Take 1 tablet (10 mg total) by mouth daily before breakfast., Disp: 90 tablet, Rfl: 3   Dulaglutide (TRULICITY) 3 MG/0.5ML SOPN, Inject 3 mg as directed once a week. (Patient not taking: Reported on 05/10/2023), Disp: 2 mL, Rfl: 5   ENTRESTO 24-26 MG, TAKE 1 TABLET BY MOUTH TWICE A DAY, Disp: 60 tablet, Rfl: 3   ferrous sulfate 324 (65 Fe) MG TBEC, Take 324 mg by mouth daily., Disp: , Rfl:    fluticasone (FLONASE) 50 MCG/ACT nasal spray, Place 2 sprays into both nostrils daily. (Patient not taking: Reported on 05/10/2023), Disp: 16 g, Rfl: 6   gabapentin (NEURONTIN) 100 MG capsule, Take 1 capsule (100 mg total) by mouth 2 (two) times daily., Disp: 180 capsule, Rfl: 3   Lancets (BD LANCET ULTRAFINE 30G) MISC, 1 application by Does  not apply route in the morning and at bedtime., Disp: 360 each, Rfl: 3   metoprolol succinate (TOPROL-XL) 25 MG 24 hr tablet, Take 1 tablet (25 mg total) by mouth daily., Disp: 90 tablet, Rfl: 3   montelukast (SINGULAIR) 10 MG tablet, TAKE 1 TABLET BY MOUTH EVERYDAY AT BEDTIME (Patient taking differently: Take 10 mg by mouth at bedtime.), Disp: 90 tablet, Rfl: 0   nitroGLYCERIN (NITROSTAT) 0.4 MG SL tablet, Place 1 tablet (0.4 mg total) under the tongue every 5 (five) minutes x 3 doses as needed for chest pain., Disp: 25 tablet, Rfl: 1   omeprazole (PRILOSEC) 20 MG capsule, Take 1 capsule (20 mg total) by mouth daily., Disp: 90 capsule, Rfl: 3   ONETOUCH ULTRA test strip, TEST BLOOD SUGARS TWICE DAILY AS NEEDED., Disp: 100 strip, Rfl: 3   pseudoephedrine (SUDAFED 12 HOUR) 120 MG 12 hr tablet, Take 1 tablet (120 mg total) by mouth 2 (two) times daily. (Patient taking differently: Take 120 mg by mouth 2 (two) times daily as needed for congestion.), Disp: 20 tablet, Rfl: 0   rosuvastatin (CRESTOR) 40 MG tablet, Take 1 tablet (40 mg total) by mouth daily., Disp: 90 tablet, Rfl: 3   Saline (SIMPLY SALINE) 0.9 % AERS, Place 2 each into the nose daily as needed. (Patient not taking: Reported on 05/10/2023), Disp: 500 mL, Rfl: 1   ticagrelor (BRILINTA) 90 MG TABS tablet, Take 1 tablet (90 mg total) by mouth 2 (two) times daily., Disp: 180  tablet, Rfl: 3   TRULICITY 0.75 MG/0.5ML SOPN, Inject 0.75 mg into the skin once a week., Disp: 2 mL, Rfl: 5   zinc gluconate 50 MG tablet, Take 50 mg by mouth daily., Disp: , Rfl:   Past Medical History: Past Medical History:  Diagnosis Date   Allergy    Arthritis    Chronic kidney disease    AKI saw Nephrologist in [place]   Diabetes mellitus without complication (HCC)    GERD (gastroesophageal reflux disease)    Hyperlipemia    Hypertension     Tobacco Use: Social History   Tobacco Use  Smoking Status Never  Smokeless Tobacco Never    Labs: Review  Flowsheet  More data exists      Latest Ref Rng & Units 08/15/2022 08/22/2022 11/19/2022 02/02/2023 02/05/2023  Labs for ITP Cardiac and Pulmonary Rehab  Cholestrol 0 - 200 mg/dL - 161  - - 096   LDL (calc) 0 - 99 mg/dL - 70  - - 43   HDL-C >04 mg/dL - 54.09  - - 52   Trlycerides <150 mg/dL - 81.1  - - 78   Hemoglobin A1c 4.8 - 5.6 % 7.9  8.5  7.1  6.9  -  TCO2 22 - 32 mmol/L - - - 22  -    Capillary Blood Glucose: Lab Results  Component Value Date   GLUCAP 121 (H) 03/18/2023   GLUCAP 197 (H) 03/18/2023   GLUCAP 111 (H) 03/15/2023   GLUCAP 104 (H) 03/15/2023   GLUCAP 152 (H) 03/13/2023     Exercise Target Goals: Exercise Program Goal: Individual exercise prescription set using results from initial 6 min walk test and THRR while considering  patient's activity barriers and safety.   Exercise Prescription Goal: Initial exercise prescription builds to 30-45 minutes a day of aerobic activity, 2-3 days per week.  Home exercise guidelines will be given to patient during program as part of exercise prescription that the participant will acknowledge.  Activity Barriers & Risk Stratification:  Activity Barriers & Cardiac Risk Stratification - 03/07/23 1234       Activity Barriers & Cardiac Risk Stratification   Activity Barriers Right Knee Replacement;History of Falls    Cardiac Risk Stratification High             6 Minute Walk:  6 Minute Walk     Row Name 03/07/23 1232 05/10/23 1229       6 Minute Walk   Phase Initial Discharge    Distance 1360 feet 1678 feet    Distance % Change -- 23.8 %    Distance Feet Change -- 318 ft    Walk Time 6 minutes 6 minutes    # of Rest Breaks 0 0    MPH 2.58 3.17    METS 3.03 3.57    RPE 9 11    Perceived Dyspnea  0 0    VO2 Peak 10.62 11    Symptoms No No    Resting HR 88 bpm 96 bpm    Resting BP 108/74 112/64    Resting Oxygen Saturation  100 % --    Exercise Oxygen Saturation  during 6 min walk 100 % --    Max Ex. HR 88 bpm  99 bpm    Max Ex. BP 122/72 134/62    2 Minute Post BP 108/74 --             Oxygen Initial Assessment:   Oxygen Re-Evaluation:   Oxygen  Discharge (Final Oxygen Re-Evaluation):   Initial Exercise Prescription:  Initial Exercise Prescription - 03/07/23 1200       Date of Initial Exercise RX and Referring Provider   Date 03/07/23    Referring Provider Dr. Nanetta Batty MD    Expected Discharge Date 05/17/23      Treadmill   MPH 2.3    Grade 0    Minutes 15    METs 2.76      NuStep   Level 2    SPM 80    Minutes 15    METs 2.2      Prescription Details   Frequency (times per week) 3    Duration Progress to 30 minutes of continuous aerobic without signs/symptoms of physical distress      Intensity   THRR 40-80% of Max Heartrate 59-118    Ratings of Perceived Exertion 11-13    Perceived Dyspnea 0-4      Progression   Progression Continue progressive overload as per policy without signs/symptoms or physical distress.      Resistance Training   Training Prescription Yes    Weight 2    Reps 10-15             Perform Capillary Blood Glucose checks as needed.  Exercise Prescription Changes:   Exercise Prescription Changes     Row Name 03/13/23 1014 03/25/23 1031 04/08/23 1026 04/22/23 1028 05/13/23 1021     Response to Exercise   Blood Pressure (Admit) 122/80 104/60 104/52 118/62 112/60   Blood Pressure (Exercise) 130/64 122/68 122/70 128/70 124/60   Blood Pressure (Exit) 108/78 104/60 132/70 108/70 112/64   Heart Rate (Admit) 89 bpm 98 bpm 97 bpm 98 bpm 89 bpm   Heart Rate (Exercise) 116 bpm 118 bpm 121 bpm 127 bpm 126 bpm   Heart Rate (Exit) 92 bpm 98 bpm 104 bpm 107 bpm 96 bpm   Rating of Perceived Exertion (Exercise) 11 11 11 8 9    Symptoms None None None None None   Comments Off to a good start with exercise. -- Patient increases speed on TM from 2.5/2.5 to 3.0/3.0 halfway through. -- Reviewed METs and goals with patient.   Duration  Continue with 30 min of aerobic exercise without signs/symptoms of physical distress. Continue with 30 min of aerobic exercise without signs/symptoms of physical distress. Continue with 30 min of aerobic exercise without signs/symptoms of physical distress. Continue with 30 min of aerobic exercise without signs/symptoms of physical distress. Continue with 30 min of aerobic exercise without signs/symptoms of physical distress.   Intensity THRR unchanged THRR unchanged THRR unchanged THRR unchanged THRR unchanged     Progression   Progression Continue to progress workloads to maintain intensity without signs/symptoms of physical distress. Continue to progress workloads to maintain intensity without signs/symptoms of physical distress. Continue to progress workloads to maintain intensity without signs/symptoms of physical distress. Continue to progress workloads to maintain intensity without signs/symptoms of physical distress. Continue to progress workloads to maintain intensity without signs/symptoms of physical distress.   Average METs 2.9 3.8 4.2 5.95 5.51     Resistance Training   Training Prescription No  Relaxation day, no weights. Yes Yes Yes Yes   Weight -- 2 lbs 3 lbs 3 lbs 3 lbs   Reps -- 10-15 10-15 10-15 10-15   Time -- 10 Minutes 10 Minutes 10 Minutes 10 Minutes     Interval Training   Interval Training No No No No No  Treadmill   MPH 2.3 2.5 3  Increases speed from 2.5 to 3.0 mph halfway 3.4 3.1   Grade 0 2.5 3  Increases incline from 2.5 to 3.0% halfway. 5 5   Minutes 15 30 30  35 30   METs 2.76 3.78 4.2 5.95 5.51     NuStep   Level 2 -- -- -- --   SPM 80 -- -- -- --   Minutes 15 -- -- -- --   METs 3 -- -- -- --     Home Exercise Plan   Plans to continue exercise at -- -- Home (comment)  Treadmill, walking Home (comment)  Treadmill, walking Home (comment)  Treadmill, walking   Frequency -- -- Add 4 additional days to program exercise sessions. Add 4 additional days to  program exercise sessions. Add 4 additional days to program exercise sessions.   Initial Home Exercises Provided -- -- 04/03/23 04/03/23 04/03/23            Exercise Comments:   Exercise Comments     Row Name 03/13/23 1122 03/25/23 1046 04/03/23 1055 04/08/23 1702 04/24/23 1133   Exercise Comments Patient tolerated first session of exercise well without symptoms. Assisted patient with warm-up/cool-down stretches. Reviewed goals with patient. Reviewed home exercise and goals with patient. Reviewed METs with patient. Reviewed goals with patient.    Row Name 05/13/23 1058           Exercise Comments Reviewed METs and goals with patient.                Exercise Goals and Review:   Exercise Goals     Row Name 03/07/23 1237             Exercise Goals   Increase Physical Activity Yes       Intervention Provide advice, education, support and counseling about physical activity/exercise needs.;Develop an individualized exercise prescription for aerobic and resistive training based on initial evaluation findings, risk stratification, comorbidities and participant's personal goals.       Expected Outcomes Short Term: Attend rehab on a regular basis to increase amount of physical activity.;Long Term: Exercising regularly at least 3-5 days a week.;Long Term: Add in home exercise to make exercise part of routine and to increase amount of physical activity.       Increase Strength and Stamina Yes       Intervention Provide advice, education, support and counseling about physical activity/exercise needs.;Develop an individualized exercise prescription for aerobic and resistive training based on initial evaluation findings, risk stratification, comorbidities and participant's personal goals.       Expected Outcomes Short Term: Increase workloads from initial exercise prescription for resistance, speed, and METs.;Short Term: Perform resistance training exercises routinely during rehab and  add in resistance training at home;Long Term: Improve cardiorespiratory fitness, muscular endurance and strength as measured by increased METs and functional capacity ( )       Able to understand and use rate of perceived exertion (RPE) scale Yes       Intervention Provide education and explanation on how to use RPE scale       Expected Outcomes Short Term: Able to use RPE daily in rehab to express subjective intensity level;Long Term:  Able to use RPE to guide intensity level when exercising independently       Knowledge and understanding of Target Heart Rate Range (THRR) Yes       Intervention Provide education and explanation of THRR including how the numbers were predicted  and where they are located for reference       Expected Outcomes Short Term: Able to state/look up THRR;Long Term: Able to use THRR to govern intensity when exercising independently;Short Term: Able to use daily as guideline for intensity in rehab       Understanding of Exercise Prescription Yes       Intervention Provide education, explanation, and written materials on patient's individual exercise prescription       Expected Outcomes Short Term: Able to explain program exercise prescription;Long Term: Able to explain home exercise prescription to exercise independently                Exercise Goals Re-Evaluation :  Exercise Goals Re-Evaluation     Row Name 03/13/23 1122 03/25/23 1046 04/03/23 1055 04/24/23 1133 05/13/23 1058     Exercise Goal Re-Evaluation   Exercise Goals Review Increase Physical Activity;Increase Strength and Stamina;Able to understand and use rate of perceived exertion (RPE) scale Increase Physical Activity;Increase Strength and Stamina;Able to understand and use rate of perceived exertion (RPE) scale Increase Physical Activity;Increase Strength and Stamina;Able to understand and use rate of perceived exertion (RPE) scale;Knowledge and understanding of Target Heart Rate Range (THRR);Able to check  pulse independently;Understanding of Exercise Prescription Increase Physical Activity;Increase Strength and Stamina;Able to understand and use rate of perceived exertion (RPE) scale;Knowledge and understanding of Target Heart Rate Range (THRR);Able to check pulse independently;Understanding of Exercise Prescription Increase Physical Activity;Increase Strength and Stamina;Able to understand and use rate of perceived exertion (RPE) scale;Knowledge and understanding of Target Heart Rate Range (THRR);Able to check pulse independently;Understanding of Exercise Prescription   Comments Patient able to understand and use RPE scale appropriately. Patient is walking on her treadmill at home in addition to exercise at cardiac rehab. Her goal is to get back to walking a 15 minute milewith incline. Patient would like to get back to walking 2.5 hours as she previously did. Patient is currently walking about 6 miles, 15,000 steps daily. Reviewed exercise prescription with patient. Patient is walking outdoors and on her treadmill at home daily. Patient has a smart watch to monitor her pulse. Wanda Medina continues to make excellent progress with exercise. She is happy to report that walked 2 miles in 36 minutes with a 5% incline on the treamill and was walking at an 18-minute mile pace. Her goal is to return to a 15-minute mile pace. She would also like to resume walking at a 9% incline on the TM.  She is walking at home and walked 4 miles in 65 minutes yesterday. Patient scheduled to complete the cardiac rehab program and progressed well achieving 5.5 METs with exercise. She plans to continue walking outdoors and on her treadmill 65 minutes or 45 minutes twice/day, 7 days/week.   Expected Outcomes Progress workloads as tolerated to help increase cardiorespiratory fitness. Increase workloads on treadmill to get back to previous pace. Patient will continue daily exercise to help increase strength and stamina and to be able to walk longer  distances again. Continue to progess workloads as tolerated on the TM to achieve personal goals. Wanda Medina will continue daily walking to maintain health and fitness gains.            Discharge Exercise Prescription (Final Exercise Prescription Changes):  Exercise Prescription Changes - 05/13/23 1021       Response to Exercise   Blood Pressure (Admit) 112/60    Blood Pressure (Exercise) 124/60    Blood Pressure (Exit) 112/64    Heart Rate (Admit)  89 bpm    Heart Rate (Exercise) 126 bpm    Heart Rate (Exit) 96 bpm    Rating of Perceived Exertion (Exercise) 9    Symptoms None    Comments Reviewed METs and goals with patient.    Duration Continue with 30 min of aerobic exercise without signs/symptoms of physical distress.    Intensity THRR unchanged      Progression   Progression Continue to progress workloads to maintain intensity without signs/symptoms of physical distress.    Average METs 5.51      Resistance Training   Training Prescription Yes    Weight 3 lbs    Reps 10-15    Time 10 Minutes      Interval Training   Interval Training No      Treadmill   MPH 3.1    Grade 5    Minutes 30    METs 5.51      Home Exercise Plan   Plans to continue exercise at Home (comment)   Treadmill, walking   Frequency Add 4 additional days to program exercise sessions.    Initial Home Exercises Provided 04/03/23             Nutrition:  Target Goals: Understanding of nutrition guidelines, daily intake of sodium 1500mg , cholesterol 200mg , calories 30% from fat and 7% or less from saturated fats, daily to have 5 or more servings of fruits and vegetables.  Biometrics:  Pre Biometrics - 03/07/23 1237       Pre Biometrics   Waist Circumference 34.5 inches    Hip Circumference 39 inches    Waist to Hip Ratio 0.88 %    Triceps Skinfold 13 mm    % Body Fat 32.6 %    Grip Strength 16 kg    Flexibility 14.75 in    Single Leg Stand 19 seconds             Post Biometrics  - 05/10/23 1236        Post  Biometrics   Height 5\' 1"  (1.549 m)    Weight 54.8 kg    Waist Circumference 36 inches    Hip Circumference 40 inches    Waist to Hip Ratio 0.9 %    BMI (Calculated) 22.84    Triceps Skinfold 13 mm    % Body Fat 33.6 %    Grip Strength 15 kg    Flexibility 15.5 in    Single Leg Stand 23.18 seconds             Nutrition Therapy Plan and Nutrition Goals:  Nutrition Therapy & Goals - 05/13/23 0901       Nutrition Therapy   Diet Heart healthy/Carbohydrate Consistent    Drug/Food Interactions Statins/Certain Fruits      Personal Nutrition Goals   Nutrition Goal Patient to identify strategies for reducing cardiovascular risk by attending the [employer] education and nutrition series weekly.    Personal Goal #2 Patient to improve diet quality by using the plate method as a guide for meal planning to include lean protein/plant protein, fruits, vegetables, whole grains, nonfat dairy as part of a well-balanced diet.    Comments Goals in action. Wanda Medina continues to attend the [employer] education and nutrition classes regularly. Patient reports following a vegetarian diet lifelong; she does eat dairy products. She reports that she has reduced carbohydrate portions over the last ~6 months and this has helped her A1c; she has previously worked with an outside RD. She is  up about 5# since starting with our program. She will benefit from participation in intensive cardiac rehab for nutrition, exercise, and lifestyle modification.      Intervention Plan   Intervention Prescribe, educate and counsel regarding individualized specific dietary modifications aiming towards targeted core components such as weight, hypertension, lipid management, diabetes, heart failure and other comorbidities.;Nutrition handout(s) given to patient.    Expected Outcomes Short Term Goal: Understand basic principles of dietary content, such as calories, fat, sodium, cholesterol and nutrients.;Long  Term Goal: Adherence to prescribed nutrition plan.             Nutrition Assessments:  Nutrition Assessments - 05/13/23 0855       Rate Your Plate Scores   Post Score 84            MEDIFICTS Score Key: ?70 Need to make dietary changes  40-70 Heart Healthy Diet ? 40 Therapeutic Level Cholesterol Diet   Flowsheet Row INTENSIVE CARDIAC REHAB from 05/10/2023 in [facility]  Picture Your Plate Total Score on Discharge 84      Picture Your Plate Scores: <82 Unhealthy dietary pattern with much room for improvement. 41-50 Dietary pattern unlikely to meet recommendations for good health and room for improvement. 51-60 More healthful dietary pattern, with some room for improvement.  >60 Healthy dietary pattern, although there may be some specific behaviors that could be improved.    Nutrition Goals Re-Evaluation:  Nutrition Goals Re-Evaluation     Row Name 03/13/23 1110 04/12/23 0856 05/13/23 0901         Goals   Current Weight -- 117 lb 11.6 oz (53.4 kg) 120 lb 13 oz (54.8 kg)     Comment A1c 6.9, lipids WNL (LDL <55), lipoprotein A 168.9, AST 373, ALT 49, GFR 45, Cr 1.26 no new labs; most recent labs  A1c 6.9, lipids WNL (LDL <55), lipoprotein A 168.9, AST 373, ALT 49, GFR 45, Cr 1.26 no new labs; most recent labs A1c 6.9, lipids WNL (LDL <55), lipoprotein A 168.9, AST 373, ALT 49, GFR 45, Cr 1.26     Expected Outcome Patient reports following a vegetarian diet lifelong; she does eat dairy products. She reports that she has reduced carbohydrate portions over the last ~6 months and this has helped her A1c. She will benefit from participation in intensive cardiac rehab for nutrition, exercise, and lifestyle modification. Goals in action. Wanda Medina continues to attend the [employer] education and nutrition classes regularly. Patient reports following a vegetarian diet lifelong; she does eat dairy products. She reports that she  has reduced carbohydrate portions over the last ~6 months and this has helped her A1c. She is up about 2# since starting with our program. She will benefit from participation in intensive cardiac rehab for nutrition, exercise, and lifestyle modification. Goals in action. Wanda Medina continues to attend the [employer] education and nutrition classes regularly. Patient reports following a vegetarian diet lifelong; she does eat dairy products. She reports that she has reduced carbohydrate portions over the last ~6 months and this has helped her A1c; she has previously worked with an outside RD. She is up about 5# since starting with our program. She will benefit from participation in intensive cardiac rehab for nutrition, exercise, and lifestyle modification.              Nutrition Goals Re-Evaluation:  Nutrition Goals Re-Evaluation     Row Name 03/13/23 1110 04/12/23 9562 05/13/23 0901  Goals   Current Weight -- 117 lb 11.6 oz (53.4 kg) 120 lb 13 oz (54.8 kg)     Comment A1c 6.9, lipids WNL (LDL <55), lipoprotein A 168.9, AST 373, ALT 49, GFR 45, Cr 1.26 no new labs; most recent labs  A1c 6.9, lipids WNL (LDL <55), lipoprotein A 168.9, AST 373, ALT 49, GFR 45, Cr 1.26 no new labs; most recent labs A1c 6.9, lipids WNL (LDL <55), lipoprotein A 168.9, AST 373, ALT 49, GFR 45, Cr 1.26     Expected Outcome Patient reports following a vegetarian diet lifelong; she does eat dairy products. She reports that she has reduced carbohydrate portions over the last ~6 months and this has helped her A1c. She will benefit from participation in intensive cardiac rehab for nutrition, exercise, and lifestyle modification. Goals in action. Wanda Medina continues to attend the [employer] education and nutrition classes regularly. Patient reports following a vegetarian diet lifelong; she does eat dairy products. She reports that she has reduced carbohydrate portions over the last ~6 months and this has helped her A1c. She is up  about 2# since starting with our program. She will benefit from participation in intensive cardiac rehab for nutrition, exercise, and lifestyle modification. Goals in action. Wanda Medina continues to attend the [employer] education and nutrition classes regularly. Patient reports following a vegetarian diet lifelong; she does eat dairy products. She reports that she has reduced carbohydrate portions over the last ~6 months and this has helped her A1c; she has previously worked with an outside RD. She is up about 5# since starting with our program. She will benefit from participation in intensive cardiac rehab for nutrition, exercise, and lifestyle modification.              Nutrition Goals Discharge (Final Nutrition Goals Re-Evaluation):  Nutrition Goals Re-Evaluation - 05/13/23 0901       Goals   Current Weight 120 lb 13 oz (54.8 kg)    Comment no new labs; most recent labs A1c 6.9, lipids WNL (LDL <55), lipoprotein A 168.9, AST 373, ALT 49, GFR 45, Cr 1.26    Expected Outcome Goals in action. Wanda Medina continues to attend the [facility] education and nutrition classes regularly. Patient reports following a vegetarian diet lifelong; she does eat dairy products. She reports that she has reduced carbohydrate portions over the last ~6 months and this has helped her A1c; she has previously worked with an outside RD. She is up about 5# since starting with our program. She will benefit from participation in intensive cardiac rehab for nutrition, exercise, and lifestyle modification.             Psychosocial: Target Goals: Acknowledge presence or absence of significant depression and/or stress, maximize coping skills, provide positive support system. Participant is able to verbalize types and ability to use techniques and skills needed for reducing stress and depression.  Initial Review & Psychosocial Screening:  Initial Psych Review & Screening - 03/07/23 1156       Initial Review   Current issues with  None Identified      Family Dynamics   Good Support System? Yes    Comments Pt has family at home for support      Barriers   Psychosocial barriers to participate in program There are no identifiable barriers or psychosocial needs.      Screening Interventions   Interventions Encouraged to exercise;Provide feedback about the scores to participant  Quality of Life Scores:  Quality of Life - 05/09/23 0929       Quality of Life Scores   Health/Function Pre 29.5 %    Health/Function Post 24.81 %    Health/Function % Change -15.9 %    Socioeconomic Pre 30 %    Socioeconomic Post 28.13 %    Socioeconomic % Change  -6.23 %    Psych/Spiritual Pre 30 %    Psych/Spiritual Post 27.21 %    Psych/Spiritual % Change -9.3 %    Family Pre 30 %    Family Post 24.13 %    Family % Change -19.57 %    GLOBAL Pre 29.79 %    GLOBAL Post 25.79 %    GLOBAL % Change -13.43 %            Scores of 19 and below usually indicate a poorer quality of life in these areas.  A difference of  2-3 points is a clinically meaningful difference.  A difference of 2-3 points in the total score of the Quality of Life Index has been associated with significant improvement in overall quality of life, self-image, physical symptoms, and general health in studies assessing change in quality of life.  PHQ-9: Review Flowsheet  More data exists      03/07/2023 02/19/2023 11/19/2022 09/19/2022 08/15/2022  Depression screen PHQ 2/9  Decreased Interest 0 0 0 0 0  Down, Depressed, Hopeless 0 0 0 0 0  PHQ - 2 Score 0 0 0 0 0  Altered sleeping 0 - - - -  Tired, decreased energy 0 - - - -  Change in appetite 0 - - - -  Feeling bad or failure about yourself  0 - - - -  Trouble concentrating 0 - - - -  Moving slowly or fidgety/restless 0 - - - -  Suicidal thoughts 0 - - - -  PHQ-9 Score 0 - - - -  Difficult doing work/chores Not difficult at all - - - -   Interpretation of Total Score  Total Score  Depression Severity:  1-4 = Minimal depression, 5-9 = Mild depression, 10-14 = Moderate depression, 15-19 = Moderately severe depression, 20-27 = Severe depression   Psychosocial Evaluation and Intervention:   Psychosocial Re-Evaluation:  Psychosocial Re-Evaluation     Row Name 03/14/23 1410 03/21/23 1555 04/16/23 1526 05/14/23 1224       Psychosocial Re-Evaluation   Current issues with None Identified None Identified None Identified None Identified    Interventions Encouraged to attend Cardiac Rehabilitation for the exercise Encouraged to attend Cardiac Rehabilitation for the exercise Encouraged to attend Cardiac Rehabilitation for the exercise Encouraged to attend Cardiac Rehabilitation for the exercise    Continue Psychosocial Services  No Follow up required No Follow up required No Follow up required No Follow up required             Psychosocial Discharge (Final Psychosocial Re-Evaluation):  Psychosocial Re-Evaluation - 05/14/23 1224       Psychosocial Re-Evaluation   Current issues with None Identified    Interventions Encouraged to attend Cardiac Rehabilitation for the exercise    Continue Psychosocial Services  No Follow up required             Vocational Rehabilitation: Provide vocational rehab assistance to qualifying candidates.   Vocational Rehab Evaluation & Intervention:  Vocational Rehab - 03/07/23 1253       Initial Vocational Rehab Evaluation & Intervention   Assessment shows need for Vocational  Rehabilitation No   Pt is retired and does not have a need for vocational rehab            Education: Education Goals: Education classes will be provided on a weekly basis, covering required topics. Participant will state understanding/return demonstration of topics presented.    Education     Row Name 03/13/23 1500     Education   Cardiac Education Topics Pritikin   Secondary school teacher School   Educator Dietitian   Weekly Topic  Tasty Appetizers and Snacks   Instruction Review Code 1- Verbalizes Understanding   Class Start Time 1145   Class Stop Time 1223   Class Time Calculation (min) 38 min    Row Name 03/15/23 1300     Education   Cardiac Education Topics Pritikin   Nurse, children's   Educator Dietitian   Select Nutrition   Nutrition Calorie Density   Instruction Review Code 1- Verbalizes Understanding   Class Start Time 1145   Class Stop Time 1225   Class Time Calculation (min) 40 min    Row Name 03/18/23 1400     Education   Cardiac Education Topics Pritikin   Nurse, children's   Educator Dietitian   Select Nutrition   Nutrition Nutrition Action Plan   Instruction Review Code 1- Verbalizes Understanding   Class Start Time 1145   Class Stop Time 1219   Class Time Calculation (min) 34 min    Row Name 03/20/23 1300     Education   Cardiac Education Topics Pritikin   Customer service manager   Weekly Topic Efficiency Cooking - Meals in a Snap   Instruction Review Code 1- Verbalizes Understanding   Class Start Time 1140   Class Stop Time 1220   Class Time Calculation (min) 40 min    Row Name 03/22/23 1400     Education   Cardiac Education Topics Pritikin   Psychologist, forensic Exercise Education   Exercise Education Move It!   Instruction Review Code 1- Verbalizes Understanding   Class Start Time 1150   Class Stop Time 1230   Class Time Calculation (min) 40 min    Row Name 03/25/23 1600     Education   Cardiac Education Topics Pritikin   Geographical information systems officer Psychosocial   Psychosocial Workshop Focused Goals, Sustainable Changes   Instruction Review Code 1- Verbalizes Understanding   Class Start Time 1150   Class Stop Time 1225   Class Time Calculation (min) 35 min    Row Name 03/27/23  1300     Education   Cardiac Education Topics Pritikin   Customer service manager   Weekly Topic Comforting Weekend Breakfasts   Instruction Review Code 1- Verbalizes Understanding   Class Start Time 1140   Class Stop Time 1212   Class Time Calculation (min) 32 min    Row Name 03/29/23 1400     Education   Cardiac Education Topics Pritikin   Hospital doctor Education   General Education Hypertension and Heart Disease  Instruction Review Code 1- Verbalizes Understanding   Class Start Time 1147   Class Stop Time 1220   Class Time Calculation (min) 33 min    Row Name 04/01/23 1300     Education   Cardiac Education Topics Pritikin   Select Core Videos     Core Videos   Educator Nurse   Select Exercise Education   Exercise Education Biomechanial Limitations   Instruction Review Code 1- Verbalizes Understanding   Class Start Time 1156   Class Stop Time 1232   Class Time Calculation (min) 36 min    Row Name 04/03/23 1300     Education   Cardiac Education Topics Pritikin   Customer service manager   Weekly Topic Comforting Weekend Breakfasts   Instruction Review Code 1- Verbalizes Understanding   Class Start Time 1135   Class Stop Time 1223   Class Time Calculation (min) 48 min    Row Name 04/05/23 1400     Education   Cardiac Education Topics Pritikin   Licensed conveyancer Nutrition   Nutrition Dining Out - Part 1   Instruction Review Code 1- Verbalizes Understanding   Class Start Time 1145   Class Stop Time 1222   Class Time Calculation (min) 37 min    Row Name 04/10/23 1200     Education   Cardiac Education Topics Pritikin   Customer service manager   Weekly Topic Fast Evening Meals   Instruction Review Code 1-  Verbalizes Understanding   Class Start Time 1140   Class Stop Time 1215   Class Time Calculation (min) 35 min    Row Name 04/12/23 1000     Education   Cardiac Education Topics Pritikin   Ship broker   Select Nutrition   Nutrition Vitamins and Minerals   Instruction Review Code 1- Verbalizes Understanding    Row Name 04/15/23 1600     Education   Cardiac Education Topics Pritikin     Workshops   Educator Exercise Physiologist   Select Psychosocial   Psychosocial Workshop Healthy Sleep for a Healthy Heart   Instruction Review Code 1- Verbalizes Understanding   Class Start Time 1145   Class Stop Time 1233   Class Time Calculation (min) 48 min    Row Name 04/17/23 1400     Education   Cardiac Education Topics Pritikin   Customer service manager   Weekly Topic International Cuisine- Spotlight on the [employer] Zones   Instruction Review Code 1- Verbalizes Understanding   Class Start Time 1140   Class Stop Time 1212   Class Time Calculation (min) 32 min    Row Name 04/19/23 1400     Education   Cardiac Education Topics Pritikin   Psychologist, forensic Exercise Education   Exercise Education Improving Performance   Instruction Review Code 1- Verbalizes Understanding   Class Start Time 1145   Class Stop Time 1230   Class Time Calculation (min) 45 min    Row Name 04/22/23 1300     Education   Cardiac Education Topics Pritikin   [employer]  Workshops   Geophysical data processor Nutrition   Nutrition Workshop Fueling a Fish farm manager Review Code 1- Teaching laboratory technician Start Time 1140   Class Stop Time 1230   Class Time Calculation (min) 50 min    Row Name 04/24/23 1300     Education   Cardiac Education Topics Pritikin   Optometrist   Weekly Topic Simple Sides and Sauces   Instruction Review Code 1- Verbalizes Understanding   Class Start Time 1140   Class Stop Time 1215   Class Time Calculation (min) 35 min    Row Name 04/26/23 1300     Education   Cardiac Education Topics Pritikin   Nurse, children's Exercise Physiologist   Select Psychosocial   Psychosocial How Our Thoughts Can Heal Our Hearts   Instruction Review Code 1- Verbalizes Understanding   Class Start Time 1150   Class Stop Time 1226   Class Time Calculation (min) 36 min    Row Name 04/29/23 1200     Education   Cardiac Education Topics Pritikin   Geographical information systems officer Psychosocial   Psychosocial Workshop From Head to Heart: The Power of a Healthy Outlook   Instruction Review Code 1- Verbalizes Understanding   Class Start Time 1153   Class Stop Time 1237   Class Time Calculation (min) 44 min    Row Name 05/01/23 1300     Education   Cardiac Education Topics Pritikin   Customer service manager   Weekly Topic Powerhouse Plant-Based Proteins   Instruction Review Code 1- Verbalizes Understanding   Class Start Time 1135   Class Stop Time 1210   Class Time Calculation (min) 35 min    Row Name 05/03/23 1400     Education   Cardiac Education Topics Pritikin   Psychologist, forensic General Education   General Education Hypertension and Heart Disease   Instruction Review Code 1- Verbalizes Understanding   Class Start Time 1145   Class Stop Time 1220   Class Time Calculation (min) 35 min    Row Name 05/08/23 1300     Education   Cardiac Education Topics Pritikin   Customer service manager   Weekly Topic Adding Flavor - Sodium-Free   Instruction Review Code 1- Verbalizes Understanding   Class Start Time  1138   Class Stop Time 1210   Class Time Calculation (min) 32 min    Row Name 05/10/23 1200     Education   Cardiac Education Topics Pritikin   Hospital doctor Education   General Education Heart Disease Risk Reduction   Instruction Review Code 1- Verbalizes Understanding   Class Start Time 1135   Class Stop Time 1210   Class Time Calculation (min) 35 min    Row Name 05/13/23 1300     Education   Cardiac Education Topics Pritikin   Geographical information systems officer Exercise   Exercise Workshop Location manager and  Fall Prevention   Instruction Review Code 1- Verbalizes Understanding   Class Start Time 1146   Class Stop Time 1226   Class Time Calculation (min) 40 min            Core Videos: Exercise    Move It!  Clinical staff conducted group or individual video education with verbal and written material and guidebook.  Patient learns the recommended Pritikin exercise program. Exercise with the goal of living a long, healthy life. Some of the health benefits of exercise include controlled diabetes, healthier blood pressure levels, improved cholesterol levels, improved heart and lung capacity, improved sleep, and better body composition. Everyone should speak with their doctor before starting or changing an exercise routine.  Biomechanical Limitations Clinical staff conducted group or individual video education with verbal and written material and guidebook.  Patient learns how biomechanical limitations can impact exercise and how we can mitigate and possibly overcome limitations to have an impactful and balanced exercise routine.  Body Composition Clinical staff conducted group or individual video education with verbal and written material and guidebook.  Patient learns that body composition (ratio of muscle mass to fat mass) is a key component to assessing  overall fitness, rather than body weight alone. Increased fat mass, especially visceral belly fat, can put [place] at increased risk for metabolic syndrome, type 2 diabetes, heart disease, and even death. It is recommended to combine diet and exercise (cardiovascular and resistance training) to improve your body composition. Seek guidance from your physician and exercise physiologist before implementing an exercise routine.  Exercise Action Plan Clinical staff conducted group or individual video education with verbal and written material and guidebook.  Patient learns the recommended strategies to achieve and enjoy long-term exercise adherence, including variety, self-motivation, self-efficacy, and positive decision making. Benefits of exercise include fitness, good health, weight management, more energy, better sleep, less stress, and overall well-being.  Medical   Heart Disease Risk Reduction Clinical staff conducted group or individual video education with verbal and written material and guidebook.  Patient learns our heart is our most vital organ as it circulates oxygen, nutrients, white blood cells, and hormones throughout the entire body, and carries waste away. Data supports a plant-based eating plan like the Pritikin Program for its effectiveness in slowing progression of and reversing heart disease. The video provides a number of recommendations to address heart disease.   Metabolic Syndrome and Belly Fat  Clinical staff conducted group or individual video education with verbal and written material and guidebook.  Patient learns what metabolic syndrome is, how it leads to heart disease, and how one can reverse it and keep it from coming back. You have metabolic syndrome if you have 3 of the following 5 criteria: abdominal obesity, high blood pressure, high triglycerides, low HDL cholesterol, and high blood sugar.  Hypertension and Heart Disease Clinical staff conducted group or individual video  education with verbal and written material and guidebook.  Patient learns that high blood pressure, or hypertension, is very common in the [place]. Hypertension is largely due to excessive salt intake, but other important risk factors include being overweight, physical inactivity, drinking too much alcohol, smoking, and not eating enough potassium from fruits and vegetables. High blood pressure is a leading risk factor for heart attack, stroke, congestive heart failure, dementia, kidney failure, and premature death. Long-term effects of excessive salt intake include stiffening of the arteries and thickening of heart muscle and organ damage. Recommendations include ways to reduce hypertension and the  risk of heart disease.  Diseases of Our Time - Focusing on Diabetes Clinical staff conducted group or individual video education with verbal and written material and guidebook.  Patient learns why the best way to stop diseases of our time is prevention, through food and other lifestyle changes. Medicine (such as prescription pills and surgeries) is often only a Band-Aid on the problem, not a long-term solution. Most common diseases of our time include obesity, type 2 diabetes, hypertension, heart disease, and cancer. The Pritikin Program is recommended and has been proven to help reduce, reverse, and/or prevent the damaging effects of metabolic syndrome.  Nutrition   Overview of the Pritikin Eating Plan  Clinical staff conducted group or individual video education with verbal and written material and guidebook.  Patient learns about the Pritikin Eating Plan for disease risk reduction. The Pritikin Eating Plan emphasizes a wide variety of unrefined, minimally-processed carbohydrates, like fruits, vegetables, whole grains, and legumes. Go, Caution, and Stop food choices are explained. Plant-based and lean animal proteins are emphasized. Rationale provided for low sodium intake for blood pressure control,  low added sugars for blood sugar stabilization, and low added fats and oils for coronary artery disease risk reduction and weight management.  Calorie Density  Clinical staff conducted group or individual video education with verbal and written material and guidebook.  Patient learns about calorie density and how it impacts the Pritikin Eating Plan. Knowing the characteristics of the food you choose will help you decide whether those foods will lead to weight gain or weight loss, and whether you want to consume more or less of them. Weight loss is usually a side effect of the Pritikin Eating Plan because of its focus on low calorie-dense foods.  Label Reading  Clinical staff conducted group or individual video education with verbal and written material and guidebook.  Patient learns about the Pritikin recommended label reading guidelines and corresponding recommendations regarding calorie density, added sugars, sodium content, and whole grains.  Dining Out - Part 1  Clinical staff conducted group or individual video education with verbal and written material and guidebook.  Patient learns that restaurant meals can be sabotaging because they can be so high in calories, fat, sodium, and/or sugar. Patient learns recommended strategies on how to positively address this and avoid unhealthy pitfalls.  Facts on Fats  Clinical staff conducted group or individual video education with verbal and written material and guidebook.  Patient learns that lifestyle modifications can be just as effective, if not more so, as many medications for lowering your risk of heart disease. A Pritikin lifestyle can help to reduce your risk of inflammation and atherosclerosis (cholesterol build-up, or plaque, in the artery walls). Lifestyle interventions such as dietary choices and physical activity address the cause of atherosclerosis. A review of the types of fats and their impact on blood cholesterol levels, along with dietary  recommendations to reduce fat intake is also included.  Nutrition Action Plan  Clinical staff conducted group or individual video education with verbal and written material and guidebook.  Patient learns how to incorporate Pritikin recommendations into their lifestyle. Recommendations include planning and keeping personal health goals in mind as an important part of their success.  Healthy Mind-Set    Healthy Minds, Bodies, Hearts  Clinical staff conducted group or individual video education with verbal and written material and guidebook.  Patient learns how to identify when they are stressed. Video will discuss the impact of that stress, as well as the many benefits  of stress management. Patient will also be introduced to stress management techniques. The way we think, act, and feel has an impact on our hearts.  How Our Thoughts Can Heal Our Hearts  Clinical staff conducted group or individual video education with verbal and written material and guidebook.  Patient learns that negative thoughts can cause depression and anxiety. This can result in negative lifestyle behavior and serious health problems. Cognitive behavioral therapy is an effective method to help control our thoughts in order to change and improve our emotional outlook.  Additional Videos:  Exercise    Improving Performance  Clinical staff conducted group or individual video education with verbal and written material and guidebook.  Patient learns to use a non-linear approach by alternating intensity levels and lengths of time spent exercising to help burn more calories and lose more body fat. Cardiovascular exercise helps improve heart health, metabolism, hormonal balance, blood sugar control, and recovery from fatigue. Resistance training improves strength, endurance, balance, coordination, reaction time, metabolism, and muscle mass. Flexibility exercise improves circulation, posture, and balance. Seek guidance from your  physician and exercise physiologist before implementing an exercise routine and learn your capabilities and proper form for all exercise.  Introduction to Yoga  Clinical staff conducted group or individual video education with verbal and written material and guidebook.  Patient learns about yoga, a discipline of the coming together of mind, breath, and body. The benefits of yoga include improved flexibility, improved range of motion, better posture and core strength, increased lung function, weight loss, and positive self-image. Yoga's heart health benefits include lowered blood pressure, healthier heart rate, decreased cholesterol and triglyceride levels, improved immune function, and reduced stress. Seek guidance from your physician and exercise physiologist before implementing an exercise routine and learn your capabilities and proper form for all exercise.  Medical   Aging: Enhancing Your Quality of Life  Clinical staff conducted group or individual video education with verbal and written material and guidebook.  Patient learns key strategies and recommendations to stay in good physical health and enhance quality of life, such as prevention strategies, having an advocate, securing a Health Care Proxy and Power of Attorney, and keeping a list of medications and system for tracking them. It also discusses how to avoid risk for bone loss.  Biology of Weight Control  Clinical staff conducted group or individual video education with verbal and written material and guidebook.  Patient learns that weight gain occurs because we consume more calories than we burn (eating more, moving less). Even if your body weight is normal, you may have higher ratios of fat compared to muscle mass. Too much body fat puts you at increased risk for cardiovascular disease, heart attack, stroke, type 2 diabetes, and obesity-related cancers. In addition to exercise, following the Pritikin Eating Plan can help reduce your  risk.  Decoding Lab Results  Clinical staff conducted group or individual video education with verbal and written material and guidebook.  Patient learns that lab test reflects one measurement whose values change over time and are influenced by many factors, including medication, stress, sleep, exercise, food, hydration, pre-existing medical conditions, and more. It is recommended to use the knowledge from this video to become more involved with your lab results and evaluate your numbers to speak with your doctor.   Diseases of Our Time - Overview  Clinical staff conducted group or individual video education with verbal and written material and guidebook.  Patient learns that according to the CDC, 50% to 70%  of chronic diseases (such as obesity, type 2 diabetes, elevated lipids, hypertension, and heart disease) are avoidable through lifestyle improvements including healthier food choices, listening to satiety cues, and increased physical activity.  Sleep Disorders Clinical staff conducted group or individual video education with verbal and written material and guidebook.  Patient learns how good quality and duration of sleep are important to overall health and well-being. Patient also learns about sleep disorders and how they impact health along with recommendations to address them, including discussing with a physician.  Nutrition  Dining Out - Part 2 Clinical staff conducted group or individual video education with verbal and written material and guidebook.  Patient learns how to plan ahead and communicate in order to maximize their dining experience in a healthy and nutritious manner. Included are recommended food choices based on the type of restaurant the patient is visiting.   Fueling a Banker conducted group or individual video education with verbal and written material and guidebook.  There is a strong connection between our food choices and our health. Diseases  like obesity and type 2 diabetes are very prevalent and are in large-part due to lifestyle choices. The Pritikin Eating Plan provides plenty of food and hunger-curbing satisfaction. It is easy to follow, affordable, and helps reduce health risks.  Menu Workshop  Clinical staff conducted group or individual video education with verbal and written material and guidebook.  Patient learns that restaurant meals can sabotage health goals because they are often packed with calories, fat, sodium, and sugar. Recommendations include strategies to plan ahead and to communicate with the manager, chef, or server to help order a healthier meal.  Planning Your Eating Strategy  Clinical staff conducted group or individual video education with verbal and written material and guidebook.  Patient learns about the Pritikin Eating Plan and its benefit of reducing the risk of disease. The Pritikin Eating Plan does not focus on calories. Instead, it emphasizes high-quality, nutrient-rich foods. By knowing the characteristics of the foods, we choose, we can determine their calorie density and make informed decisions.  Targeting Your Nutrition Priorities  Clinical staff conducted group or individual video education with verbal and written material and guidebook.  Patient learns that lifestyle habits have a tremendous impact on disease risk and progression. This video provides eating and physical activity recommendations based on your personal health goals, such as reducing LDL cholesterol, losing weight, preventing or controlling type 2 diabetes, and reducing high blood pressure.  Vitamins and Minerals  Clinical staff conducted group or individual video education with verbal and written material and guidebook.  Patient learns different ways to obtain key vitamins and minerals, including through a recommended healthy diet. It is important to discuss all supplements you take with your doctor.   Healthy Mind-Set    Smoking  Cessation  Clinical staff conducted group or individual video education with verbal and written material and guidebook.  Patient learns that cigarette smoking and tobacco addiction pose a serious health risk which affects millions of people. Stopping smoking will significantly reduce the risk of heart disease, lung disease, and many forms of cancer. Recommended strategies for quitting are covered, including working with your doctor to develop a successful plan.  Culinary   Becoming a Set designer conducted group or individual video education with verbal and written material and guidebook.  Patient learns that cooking at home can be healthy, cost-effective, quick, and puts them in control. Keys to cooking healthy recipes will  include looking at your recipe, assessing your equipment needs, planning ahead, making it simple, choosing cost-effective seasonal ingredients, and limiting the use of added fats, salts, and sugars.  Cooking - Breakfast and Snacks  Clinical staff conducted group or individual video education with verbal and written material and guidebook.  Patient learns how important breakfast is to satiety and nutrition through the entire day. Recommendations include key foods to eat during breakfast to help stabilize blood sugar levels and to prevent overeating at meals later in the day. Planning ahead is also a key component.  Cooking - Educational psychologist conducted group or individual video education with verbal and written material and guidebook.  Patient learns eating strategies to improve overall health, including an approach to cook more at home. Recommendations include thinking of animal protein as a side on your plate rather than center stage and focusing instead on lower calorie dense options like vegetables, fruits, whole grains, and plant-based proteins, such as beans. Making sauces in large quantities to freeze for later and leaving the skin on your  vegetables are also recommended to maximize your experience.  Cooking - Healthy Salads and Dressing Clinical staff conducted group or individual video education with verbal and written material and guidebook.  Patient learns that vegetables, fruits, whole grains, and legumes are the foundations of the Pritikin Eating Plan. Recommendations include how to incorporate each of these in flavorful and healthy salads, and how to create homemade salad dressings. Proper handling of ingredients is also covered. Cooking - Soups and [employer] - Soups and Desserts Clinical staff conducted group or individual video education with verbal and written material and guidebook.  Patient learns that Pritikin soups and desserts make for easy, nutritious, and delicious snacks and meal components that are low in sodium, fat, sugar, and calorie density, while high in vitamins, minerals, and filling fiber. Recommendations include simple and healthy ideas for soups and desserts.   Overview     The Pritikin Solution Program Overview Clinical staff conducted group or individual video education with verbal and written material and guidebook.  Patient learns that the results of the Pritikin Program have been documented in more than 100 articles published in peer-reviewed journals, and the benefits include reducing risk factors for (and, in some cases, even reversing) high cholesterol, high blood pressure, type 2 diabetes, obesity, and more! An overview of the three key pillars of the Pritikin Program will be covered: eating well, doing regular exercise, and having a healthy mind-set.  WORKSHOPS  Exercise: Exercise Basics: Building Your Action Plan Clinical staff led group instruction and group discussion with PowerPoint presentation and patient guidebook. To enhance the learning environment the use of posters, models and videos may be added. At the conclusion of this workshop, patients will comprehend the  difference between physical activity and exercise, as well as the benefits of incorporating both, into their routine. Patients will understand the FITT (Frequency, Intensity, Time, and Type) principle and how to use it to build an exercise action plan. In addition, safety concerns and other considerations for exercise and cardiac rehab will be addressed by the presenter. The purpose of this lesson is to promote a comprehensive and effective weekly exercise routine in order to improve patients' overall level of fitness.   Managing Heart Disease: Your Path to a Healthier Heart Clinical staff led group instruction and group discussion with PowerPoint presentation and patient guidebook. To enhance the learning environment the use of posters, models and videos may  be added.At the conclusion of this workshop, patients will understand the anatomy and physiology of the heart. Additionally, they will understand how Pritikin's three pillars impact the risk factors, the progression, and the management of heart disease.  The purpose of this lesson is to provide a high-level overview of the heart, heart disease, and how the Pritikin lifestyle positively impacts risk factors.  Exercise Biomechanics Clinical staff led group instruction and group discussion with PowerPoint presentation and patient guidebook. To enhance the learning environment the use of posters, models and videos may be added. Patients will learn how the structural parts of their bodies function and how these functions impact their daily activities, movement, and exercise. Patients will learn how to promote a neutral spine, learn how to manage pain, and identify ways to improve their physical movement in order to promote healthy living. The purpose of this lesson is to expose patients to common physical limitations that impact physical activity. Participants will learn practical ways to adapt and manage aches and pains, and to minimize their  effect on regular exercise. Patients will learn how to maintain good posture while sitting, walking, and lifting.  Balance Training and Fall Prevention  Clinical staff led group instruction and group discussion with PowerPoint presentation and patient guidebook. To enhance the learning environment the use of posters, models and videos may be added. At the conclusion of this workshop, patients will understand the importance of their sensorimotor skills (vision, proprioception, and the vestibular system) in maintaining their ability to balance as they age. Patients will apply a variety of balancing exercises that are appropriate for their current level of function. Patients will understand the common causes for poor balance, possible solutions to these problems, and ways to modify their physical environment in order to minimize their fall risk. The purpose of this lesson is to teach patients about the importance of maintaining balance as they age and ways to minimize their risk of falling.  WORKSHOPS   Nutrition:  Fueling a Ship broker led group instruction and group discussion with PowerPoint presentation and patient guidebook. To enhance the learning environment the use of posters, models and videos may be added. Patients will review the foundational principles of the Pritikin Eating Plan and understand what constitutes a serving size in each of the food groups. Patients will also learn Pritikin-friendly foods that are better choices when away from home and review make-ahead meal and snack options. Calorie density will be reviewed and applied to three nutrition priorities: weight maintenance, weight loss, and weight gain. The purpose of this lesson is to reinforce (in a group setting) the key concepts around what patients are recommended to eat and how to apply these guidelines when away from home by planning and selecting Pritikin-friendly options. Patients will understand how calorie  density may be adjusted for different weight management goals.  Mindful Eating  Clinical staff led group instruction and group discussion with PowerPoint presentation and patient guidebook. To enhance the learning environment the use of posters, models and videos may be added. Patients will briefly review the concepts of the Pritikin Eating Plan and the importance of low-calorie dense foods. The concept of mindful eating will be introduced as well as the importance of paying attention to internal hunger signals. Triggers for non-hunger eating and techniques for dealing with triggers will be explored. The purpose of this lesson is to provide patients with the opportunity to review the basic principles of the Pritikin Eating Plan, discuss the value of eating mindfully  and how to measure internal cues of hunger and fullness using the Hunger Scale. Patients will also discuss reasons for non-hunger eating and learn strategies to use for controlling emotional eating.  Targeting Your Nutrition Priorities Clinical staff led group instruction and group discussion with PowerPoint presentation and patient guidebook. To enhance the learning environment the use of posters, models and videos may be added. Patients will learn how to determine their genetic susceptibility to disease by reviewing their family history. Patients will gain insight into the importance of diet as part of an overall healthy lifestyle in mitigating the impact of genetics and other environmental insults. The purpose of this lesson is to provide patients with the opportunity to assess their personal nutrition priorities by looking at their family history, their own health history and current risk factors. Patients will also be able to discuss ways of prioritizing and modifying the Pritikin Eating Plan for their highest risk areas  Menu  Clinical staff led group instruction and group discussion with PowerPoint presentation and patient guidebook. To  enhance the learning environment the use of posters, models and videos may be added. Using menus brought in from [employer], or printed from [employer], patients will apply the Pritikin dining out guidelines that were presented in the [employer] video. Patients will also be able to practice these guidelines in a variety of provided scenarios. The purpose of this lesson is to provide patients with the opportunity to practice hands-on learning of the Pritikin Dining Out guidelines with actual menus and practice scenarios.  Label Reading Clinical staff led group instruction and group discussion with PowerPoint presentation and patient guidebook. To enhance the learning environment the use of posters, models and videos may be added. Patients will review and discuss the Pritikin label reading guidelines presented in Pritikin's Label Reading Educational series video. Using fool labels brought in from local grocery stores and markets, patients will apply the label reading guidelines and determine if the packaged food meet the Pritikin guidelines. The purpose of this lesson is to provide patients with the opportunity to review, discuss, and practice hands-on learning of the Pritikin Label Reading guidelines with actual packaged food labels. Cooking School  Pritikin's [employer] are designed to teach patients ways to prepare quick, simple, and affordable recipes at home. The importance of nutrition's role in chronic disease risk reduction is reflected in its emphasis in the overall Pritikin program. By learning how to prepare essential core Pritikin Eating Plan recipes, patients will increase control over what they eat; be able to customize the flavor of foods without the use of added salt, sugar, or fat; and improve the quality of the food they consume. By learning a set of core recipes which are easily assembled, quickly prepared, and affordable, patients are more likely to  prepare more healthy foods at home. These workshops focus on convenient breakfasts, simple entres, side dishes, and desserts which can be prepared with minimal effort and are consistent with nutrition recommendations for cardiovascular risk reduction. Cooking [employer] are taught by a Armed forces logistics/support/administrative officer (RD) who has been trained by the [employer]. The chef or RD has a clear understanding of the importance of minimizing - if not completely eliminating - added fat, sugar, and sodium in recipes. Throughout the series of Cooking School Workshop sessions, patients will learn about healthy ingredients and efficient methods of cooking to build confidence in their capability to prepare    [employer] weekly topics:  Adding Flavor- Sodium-Free  Fast and Healthy Breakfasts  Powerhouse Plant-Based Proteins  Satisfying Salads and Dressings  Simple Sides and Sauces  International Cuisine-Spotlight on the Blue Zones  Delicious Desserts  Savory Soups  Efficiency Cooking - Meals in a Snap  Tasty Appetizers and Snacks  Comforting Weekend Breakfasts  One-Pot Wonders   Fast Evening Meals  Landscape architect Your Pritikin Plate  WORKSHOPS   Healthy Mindset (Psychosocial):  Focused Goals, Sustainable Changes Clinical staff led group instruction and group discussion with PowerPoint presentation and patient guidebook. To enhance the learning environment the use of posters, models and videos may be added. Patients will be able to apply effective goal setting strategies to establish at least one personal goal, and then take consistent, meaningful action toward that goal. They will learn to identify common barriers to achieving personal goals and develop strategies to overcome them. Patients will also gain an understanding of how our mind-set can impact our ability to achieve goals and the importance of cultivating a positive and growth-oriented mind-set. The purpose  of this lesson is to provide patients with a deeper understanding of how to set and achieve personal goals, as well as the tools and strategies needed to overcome common obstacles which may arise along the way.  From Head to Heart: The Power of a Healthy Outlook  Clinical staff led group instruction and group discussion with PowerPoint presentation and patient guidebook. To enhance the learning environment the use of posters, models and videos may be added. Patients will be able to recognize and describe the impact of emotions and mood on physical health. They will discover the importance of self-care and explore self-care practices which may work for them. Patients will also learn how to utilize the 4 C's to cultivate a healthier outlook and better manage stress and challenges. The purpose of this lesson is to demonstrate to patients how a healthy outlook is an essential part of maintaining good health, especially as they continue their cardiac rehab journey.  Healthy Sleep for a Healthy Heart Clinical staff led group instruction and group discussion with PowerPoint presentation and patient guidebook. To enhance the learning environment the use of posters, models and videos may be added. At the conclusion of this workshop, patients will be able to demonstrate knowledge of the importance of sleep to overall health, well-being, and quality of life. They will understand the symptoms of, and treatments for, common sleep disorders. Patients will also be able to identify daytime and nighttime behaviors which impact sleep, and they will be able to apply these tools to help manage sleep-related challenges. The purpose of this lesson is to provide patients with a general overview of sleep and outline the importance of quality sleep. Patients will learn about a few of the most common sleep disorders. Patients will also be introduced to the concept of "sleep hygiene," and discover ways to self-manage certain sleeping  problems through simple daily behavior changes. Finally, the workshop will motivate patients by clarifying the links between quality sleep and their goals of heart-healthy living.   Recognizing and Reducing Stress Clinical staff led group instruction and group discussion with PowerPoint presentation and patient guidebook. To enhance the learning environment the use of posters, models and videos may be added. At the conclusion of this workshop, patients will be able to understand the types of stress reactions, differentiate between acute and chronic stress, and recognize the impact that chronic stress has on their health. They will also be able to apply  different coping mechanisms, such as reframing negative self-talk. Patients will have the opportunity to practice a variety of stress management techniques, such as deep abdominal breathing, progressive muscle relaxation, and/or guided imagery.  The purpose of this lesson is to educate patients on the role of stress in their lives and to provide healthy techniques for coping with it.  Learning Barriers/Preferences:  Learning Barriers/Preferences - 03/07/23 1241       Learning Barriers/Preferences   Learning Barriers Sight    Learning Preferences Pictoral;Written Material;Computer/Internet             Education Topics:  Knowledge Questionnaire Score:  Knowledge Questionnaire Score - 05/09/23 0930       Knowledge Questionnaire Score   Pre Score 18/24    Post Score 19/24             Core Components/Risk Factors/Patient Goals at Admission:  Personal Goals and Risk Factors at Admission - 03/07/23 1244       Core Components/Risk Factors/Patient Goals on Admission    Weight Management Yes    Diabetes Yes    Intervention Provide education about signs/symptoms and action to take for hypo/hyperglycemia.;Provide education about proper nutrition, including hydration, and aerobic/resistive exercise prescription along with prescribed  medications to achieve blood glucose in normal ranges: Fasting glucose 65-99 mg/dL    Expected Outcomes Short Term: Participant verbalizes understanding of the signs/symptoms and immediate care of hyper/hypoglycemia, proper foot care and importance of medication, aerobic/resistive exercise and nutrition plan for blood glucose control.;Long Term: Attainment of HbA1C < 7%.    Hypertension Yes    Intervention Provide education on lifestyle modifcations including regular physical activity/exercise, weight management, moderate sodium restriction and increased consumption of fresh fruit, vegetables, and low fat dairy, alcohol moderation, and smoking cessation.;Monitor prescription use compliance.    Expected Outcomes Short Term: Continued assessment and intervention until BP is < 140/75mm HG in hypertensive participants. < 130/84mm HG in hypertensive participants with diabetes, heart failure or chronic kidney disease.;Long Term: Maintenance of blood pressure at goal levels.    Lipids Yes    Intervention Provide education and support for participant on nutrition & aerobic/resistive exercise along with prescribed medications to achieve LDL 70mg , HDL >40mg .    Expected Outcomes Short Term: Participant states understanding of desired cholesterol values and is compliant with medications prescribed. Participant is following exercise prescription and nutrition guidelines.;Long Term: Cholesterol controlled with medications as prescribed, with individualized exercise RX and with personalized nutrition plan. Value goals: LDL < 70mg , HDL > 40 mg.    Personal Goal Other Yes    Personal Goal Short: get up from flooron her own, Long term: safely be able to garden,walking on treadmill again    Intervention Will continue to monitor pt and progress workloads as tolerated without sign or symptom    Expected Outcomes Pt will achieve her goals and gain strength             Core Components/Risk Factors/Patient Goals  Review:   Goals and Risk Factor Review     Row Name 03/14/23 1411 03/21/23 1556 04/16/23 1527 05/14/23 1228       Core Components/Risk Factors/Patient Goals Review   Personal Goals Review Weight Management/Obesity;Hypertension;Lipids;Diabetes Weight Management/Obesity;Hypertension;Lipids;Diabetes Weight Management/Obesity;Hypertension;Lipids;Diabetes Weight Management/Obesity;Hypertension;Lipids;Diabetes    Review Wanda Medina started intensive cardiac rehab on 03/13/23 and did well with exercise. Vital signs and CBg's were stable Wanda Medina started intensive cardiac rehab on 03/13/23 and is doing well with exercise.. Vital signs and CBg's have been stable Wanda Medina is doing  well with exercise at intensive cardiac rehab. Vital signs and CBg's have been stable Wanda Medina is doing well with exercise at intensive cardiac rehab. Vital signs and CBg's have been stable. Wanda Medina will complete intensive cardiac rehab on 05/17/23    Expected Outcomes Wanda Medina will continue to participate in intensive cardiac rehab for exercise, nutrition and lifestyle modifications Wanda Medina will continue to participate in intensive cardiac rehab for exercise, nutrition and lifestyle modifications Wanda Medina will continue to participate in intensive cardiac rehab for exercise, nutrition and lifestyle modifications Wanda Medina will continue to participate in intensive cardiac rehab for exercise, nutrition and lifestyle modifications             Core Components/Risk Factors/Patient Goals at Discharge (Final Review):   Goals and Risk Factor Review - 05/14/23 1228       Core Components/Risk Factors/Patient Goals Review   Personal Goals Review Weight Management/Obesity;Hypertension;Lipids;Diabetes    Review Wanda Medina is doing well with exercise at intensive cardiac rehab. Vital signs and CBg's have been stable. Wanda Medina will complete intensive cardiac rehab on 05/17/23    Expected Outcomes Wanda Medina will continue to participate in intensive cardiac rehab for exercise,  nutrition and lifestyle modifications             ITP Comments:  ITP Comments     Row Name 03/07/23 1304 03/14/23 1409 03/21/23 1554 04/16/23 1525 05/14/23 1223   ITP Comments Dr. Armanda Magic medical director. Introduction to [facility] education program/intensive cardiac rehab. Initial Orientation packet reviewed with patient 30 Day ITP Review. Wanda Medina started intensive cardiac rehab on 03/13/23 and did well with exercise 30 Day ITP Review. Wanda Medina started intensive cardiac rehab on 03/13/23 and is off to a good start to exercise. 30 Day ITP Review. Wanda Medina has good attendance and participation in  intensive cardiac rehab. 30 Day ITP Review. Wanda Medina has good attendance and participation in  intensive cardiac rehab. Wanda Medina will complete intensive cardiac rehab on 05/17/23            Comments: See ITP Comments

## 2023-05-15 ENCOUNTER — Encounter (HOSPITAL_COMMUNITY)
Admission: RE | Admit: 2023-05-15 | Discharge: 2023-05-15 | Disposition: A | Discharge status: Home / Self Care | Payer: BC Managed Care – PPO | Source: Ambulatory Visit | Attending: Cardiovascular Disease | Admitting: Cardiovascular Disease

## 2023-05-15 DIAGNOSIS — Z955 Presence of coronary angioplasty implant and graft: Secondary | ICD-10-CM | POA: Diagnosis not present

## 2023-05-15 DIAGNOSIS — I2121 ST elevation (STEMI) myocardial infarction involving left circumflex coronary artery: Secondary | ICD-10-CM

## 2023-05-17 ENCOUNTER — Encounter: Payer: Self-pay | Admitting: Nurse Practitioner

## 2023-05-17 ENCOUNTER — Ambulatory Visit: Payer: BC Managed Care – PPO | Attending: Nurse Practitioner | Admitting: Nurse Practitioner

## 2023-05-17 ENCOUNTER — Encounter (HOSPITAL_COMMUNITY)
Admission: RE | Admit: 2023-05-17 | Discharge: 2023-05-17 | Disposition: A | Discharge status: Home / Self Care | Payer: BC Managed Care – PPO | Source: Ambulatory Visit | Attending: Cardiovascular Disease | Admitting: Cardiovascular Disease

## 2023-05-17 VITALS — BP 114/66 | HR 86 | Ht 61.0 in | Wt 120.2 lb

## 2023-05-17 VITALS — BP 122/64 | HR 96 | Ht 61.5 in | Wt 121.2 lb

## 2023-05-17 DIAGNOSIS — N1832 Chronic kidney disease, stage 3b: Secondary | ICD-10-CM

## 2023-05-17 DIAGNOSIS — I2121 ST elevation (STEMI) myocardial infarction involving left circumflex coronary artery: Secondary | ICD-10-CM

## 2023-05-17 DIAGNOSIS — Z955 Presence of coronary angioplasty implant and graft: Secondary | ICD-10-CM | POA: Diagnosis not present

## 2023-05-17 DIAGNOSIS — I10 Essential (primary) hypertension: Secondary | ICD-10-CM

## 2023-05-17 DIAGNOSIS — I2510 Atherosclerotic heart disease of native coronary artery without angina pectoris: Secondary | ICD-10-CM | POA: Diagnosis not present

## 2023-05-17 DIAGNOSIS — E785 Hyperlipidemia, unspecified: Secondary | ICD-10-CM | POA: Diagnosis not present

## 2023-05-17 DIAGNOSIS — E1122 Type 2 diabetes mellitus with diabetic chronic kidney disease: Secondary | ICD-10-CM

## 2023-05-17 DIAGNOSIS — Z7984 Long term (current) use of oral hypoglycemic drugs: Secondary | ICD-10-CM

## 2023-05-17 DIAGNOSIS — I255 Ischemic cardiomyopathy: Secondary | ICD-10-CM | POA: Diagnosis not present

## 2023-05-17 NOTE — Progress Notes (Signed)
Discharge Progress Report  Patient Details  Name: Jayse Hammond MRN: [id number] Date of Birth: 1951-07-23 Referring Provider:   Flowsheet Row INTENSIVE CARDIAC REHAB ORIENT from 03/07/2023 in [facility]  Referring Provider Dr. Nanetta Batty MD        Number of Visits: 36  Reason for Discharge:  Patient reached a stable level of exercise. Patient independent in their exercise. Patient has met program and personal goals.  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 Nanetta Batty MD    Expected Discharge Date 05/17/23      Treadmill   MPH 2.3    Grade 0    Minutes 15    METs 2.76      NuStep   Level 2    SPM 80    Minutes 15    METs 2.2      Prescription Details   Frequency (times per week) 3    Duration Progress to 30 minutes of continuous aerobic without signs/symptoms of physical distress      Intensity   THRR 40-80% of Max Heartrate 59-118    Ratings of Perceived Exertion 11-13    Perceived Dyspnea 0-4      Progression   Progression Continue progressive overload as per policy without signs/symptoms or physical distress.      Resistance Training   Training Prescription Yes    Weight 2    Reps 10-15             Discharge Exercise Prescription (Final Exercise Prescription Changes):  Exercise Prescription Changes - 05/17/23 1022       Response to Exercise   Blood Pressure (Admit) 114/66    Blood Pressure (Exercise) 138/79    Blood Pressure (Exit) 112/68    Heart  Rate (Admit) 86 bpm    Heart Rate (Exercise) 120 bpm    Heart Rate (Exit) 95 bpm    Rating of Perceived Exertion (Exercise) 9    Symptoms None    Comments Patient completed the cardiac rehab program today.    Duration Continue with 30 min of aerobic exercise without signs/symptoms of physical distress.    Intensity THRR unchanged      Progression   Progression Continue to progress workloads to maintain intensity without signs/symptoms of physical distress.    Average METs 5.6      Resistance Training   Training Prescription Yes    Weight 3 lbs    Reps 10-15    Time 10 Minutes      Interval Training   Interval Training No      Treadmill   MPH 3.2    Grade 5    Minutes 35    METs 5.66      Home Exercise Plan   Plans to continue exercise at Home (comment)   Treadmill, walking   Frequency Add 4 additional days to program exercise sessions.    Initial Home Exercises Provided 04/03/23             Functional Capacity:  6 Minute Walk     Row Name 03/07/23 1232 05/10/23 1229       6 Minute Walk   Phase Initial Discharge    Distance 1360 feet 1678 feet    Distance % Change -- 23.8 %    Distance Feet Change -- 318 ft    Walk Time 6 minutes 6 minutes    # of Rest Breaks 0 0    MPH 2.58 3.17    METS 3.03 3.57    RPE 9 11    Perceived Dyspnea  0 0    VO2 Peak 10.62 11    Symptoms No No    Resting HR 88 bpm 96 bpm    Resting BP 108/74 112/64    Resting Oxygen Saturation  100 % --    Exercise Oxygen Saturation  during 6 min walk 100 % --    Max Ex. HR 88 bpm 99 bpm    Max Ex. BP 122/72 134/62    2 Minute Post BP 108/74 --             Psychological, QOL, Others - Outcomes: PHQ 2/9:    05/17/2023   11:29 AM 03/07/2023   12:56 PM 02/19/2023   11:41 AM 11/19/2022   10:16 AM 09/19/2022   11:27 AM  Depression screen PHQ 2/9  Decreased Interest 0 0 0 0 0  Down, Depressed, Hopeless 0 0 0 0 0  PHQ - 2 Score 0 0 0 0 0  Altered sleeping 0 0     Tired, decreased  energy 0 0     Change in appetite 0 0     Feeling bad or failure about yourself  0 0     Trouble concentrating 0 0     Moving slowly or fidgety/restless 0 0     Suicidal thoughts 0 0     PHQ-9 Score 0 0     Difficult doing work/chores  Not difficult at all       Quality of Life:  Quality of Life - 05/09/23 0929       Quality of Life   Select Quality of Life      Quality of Life Scores   Health/Function Pre 29.5 %    Health/Function Post 24.81 %    Health/Function % Change -15.9 %    Socioeconomic Pre 30 %    Socioeconomic Post 28.13 %    Socioeconomic % Change  -6.23 %    Psych/Spiritual Pre 30 %    Psych/Spiritual Post 27.21 %    Psych/Spiritual % Change -9.3 %    Family Pre 30 %    Family Post 24.13 %    Family % Change -19.57 %    GLOBAL Pre 29.79 %    GLOBAL Post 25.79 %    GLOBAL % Change -13.43 %             Personal Goals: Goals established at orientation with interventions provided to work toward goal.  Personal Goals and Risk Factors at Admission - 03/07/23 1244       Core Components/Risk Factors/Patient Goals on Admission    Weight Management Yes    Diabetes Yes    Intervention Provide education about signs/symptoms and action to take for hypo/hyperglycemia.;Provide education about proper nutrition, including hydration, and aerobic/resistive exercise prescription along with prescribed medications to achieve blood glucose in normal ranges: Fasting glucose 65-99 mg/dL    Expected Outcomes Short Term: Participant verbalizes understanding of the signs/symptoms  and immediate care of hyper/hypoglycemia, proper foot care and importance of medication, aerobic/resistive exercise and nutrition plan for blood glucose control.;Long Term: Attainment of HbA1C < 7%.    Hypertension Yes    Intervention Provide education on lifestyle modifcations including regular physical activity/exercise, weight management, moderate sodium restriction and increased consumption of fresh  fruit, vegetables, and low fat dairy, alcohol moderation, and smoking cessation.;Monitor prescription use compliance.    Expected Outcomes Short Term: Continued assessment and intervention until BP is < 140/52mm HG in hypertensive participants. < 130/2mm HG in hypertensive participants with diabetes, heart failure or chronic kidney disease.;Long Term: Maintenance of blood pressure at goal levels.    Lipids Yes    Intervention Provide education and support for participant on nutrition & aerobic/resistive exercise along with prescribed medications to achieve LDL 70mg , HDL >40mg .    Expected Outcomes Short Term: Participant states understanding of desired cholesterol values and is compliant with medications prescribed. Participant is following exercise prescription and nutrition guidelines.;Long Term: Cholesterol controlled with medications as prescribed, with individualized exercise RX and with personalized nutrition plan. Value goals: LDL < 70mg , HDL > 40 mg.    Personal Goal Other Yes    Personal Goal Short: get up from flooron her own, Long term: safely be able to garden,walking on treadmill again    Intervention Will continue to monitor pt and progress workloads as tolerated without sign or symptom    Expected Outcomes Pt will achieve her goals and gain strength              Personal Goals Discharge:  Goals and Risk Factor Review     Row Name 03/14/23 1411 03/21/23 1556 04/16/23 1527 05/14/23 1228       Core Components/Risk Factors/Patient Goals Review   Personal Goals Review Weight Management/Obesity;Hypertension;Lipids;Diabetes Weight Management/Obesity;Hypertension;Lipids;Diabetes Weight Management/Obesity;Hypertension;Lipids;Diabetes Weight Management/Obesity;Hypertension;Lipids;Diabetes    Review Smita started intensive cardiac rehab on 03/13/23 and did well with exercise. Vital signs and CBg's were stable Smita started intensive cardiac rehab on 03/13/23 and is doing well with  exercise.. Vital signs and CBg's have been stable Smita is doing well with exercise at intensive cardiac rehab. Vital signs and CBg's have been stable Smita is doing well with exercise at intensive cardiac rehab. Vital signs and CBg's have been stable. Smita will complete intensive cardiac rehab on 05/17/23    Expected Outcomes Smita will continue to participate in intensive cardiac rehab for exercise, nutrition and lifestyle modifications Smita will continue to participate in intensive cardiac rehab for exercise, nutrition and lifestyle modifications Smita will continue to participate in intensive cardiac rehab for exercise, nutrition and lifestyle modifications Smita will continue to participate in intensive cardiac rehab for exercise, nutrition and lifestyle modifications             Exercise Goals and Review:  Exercise Goals     Row Name 03/07/23 1237             Exercise Goals   Increase Physical Activity Yes       Intervention Provide advice, education, support and counseling about physical activity/exercise needs.;Develop an individualized exercise prescription for aerobic and resistive training based on initial evaluation findings, risk stratification, comorbidities and participant's personal goals.       Expected Outcomes Short Term: Attend rehab on a regular basis to increase amount of physical activity.;Long Term: Exercising regularly at least 3-5 days a week.;Long Term: Add in home exercise to make exercise part of routine and to increase amount of physical activity.  Increase Strength and Stamina Yes       Intervention Provide advice, education, support and counseling about physical activity/exercise needs.;Develop an individualized exercise prescription for aerobic and resistive training based on initial evaluation findings, risk stratification, comorbidities and participant's personal goals.       Expected Outcomes Short Term: Increase workloads from initial exercise  prescription for resistance, speed, and METs.;Short Term: Perform resistance training exercises routinely during rehab and add in resistance training at home;Long Term: Improve cardiorespiratory fitness, muscular endurance and strength as measured by increased METs and functional capacity ( )       Able to understand and use rate of perceived exertion (RPE) scale Yes       Intervention Provide education and explanation on how to use RPE scale       Expected Outcomes Short Term: Able to use RPE daily in rehab to express subjective intensity level;Long Term:  Able to use RPE to guide intensity level when exercising independently       Knowledge and understanding of Target Heart Rate Range (THRR) Yes       Intervention Provide education and explanation of THRR including how the numbers were predicted and where they are located for reference       Expected Outcomes Short Term: Able to state/look up THRR;Long Term: Able to use THRR to govern intensity when exercising independently;Short Term: Able to use daily as guideline for intensity in rehab       Understanding of Exercise Prescription Yes       Intervention Provide education, explanation, and written materials on patient's individual exercise prescription       Expected Outcomes Short Term: Able to explain program exercise prescription;Long Term: Able to explain home exercise prescription to exercise independently                Exercise Goals Re-Evaluation:  Exercise Goals Re-Evaluation     Row Name 03/13/23 1122 03/25/23 1046 04/03/23 1055 04/24/23 1133 05/13/23 1058     Exercise Goal Re-Evaluation   Exercise Goals Review Increase Physical Activity;Increase Strength and Stamina;Able to understand and use rate of perceived exertion (RPE) scale Increase Physical Activity;Increase Strength and Stamina;Able to understand and use rate of perceived exertion (RPE) scale Increase Physical Activity;Increase Strength and Stamina;Able to understand  and use rate of perceived exertion (RPE) scale;Knowledge and understanding of Target Heart Rate Range (THRR);Able to check pulse independently;Understanding of Exercise Prescription Increase Physical Activity;Increase Strength and Stamina;Able to understand and use rate of perceived exertion (RPE) scale;Knowledge and understanding of Target Heart Rate Range (THRR);Able to check pulse independently;Understanding of Exercise Prescription Increase Physical Activity;Increase Strength and Stamina;Able to understand and use rate of perceived exertion (RPE) scale;Knowledge and understanding of Target Heart Rate Range (THRR);Able to check pulse independently;Understanding of Exercise Prescription   Comments Patient able to understand and use RPE scale appropriately. Patient is walking on her treadmill at home in addition to exercise at cardiac rehab. Her goal is to get back to walking a 15 minute milewith incline. Patient would like to get back to walking 2.5 hours as she previously did. Patient is currently walking about 6 miles, 15,000 steps daily. Reviewed exercise prescription with patient. Patient is walking outdoors and on her treadmill at home daily. Patient has a smart watch to monitor her pulse. Smita continues to make excellent progress with exercise. She is happy to report that walked 2 miles in 36 minutes with a 5% incline on the treamill and was walking at an 18-minute mile  pace. Her goal is to return to a 15-minute mile pace. She would also like to resume walking at a 9% incline on the TM.  She is walking at home and walked 4 miles in 65 minutes yesterday. Patient scheduled to complete the cardiac rehab program and progressed well achieving 5.5 METs with exercise. She plans to continue walking outdoors and on her treadmill 65 minutes or 45 minutes twice/day, 7 days/week.   Expected Outcomes Progress workloads as tolerated to help increase cardiorespiratory fitness. Increase workloads on treadmill to get back  to previous pace. Patient will continue daily exercise to help increase strength and stamina and to be able to walk longer distances again. Continue to progess workloads as tolerated on the TM to achieve personal goals. Smita will continue daily walking to maintain health and fitness gains.    Row Name 05/17/23 1130             Exercise Goal Re-Evaluation   Exercise Goals Review Increase Physical Activity;Increase Strength and Stamina;Able to understand and use rate of perceived exertion (RPE) scale;Knowledge and understanding of Target Heart Rate Range (THRR);Able to check pulse independently;Understanding of Exercise Prescription       Comments Smita completed the cardiac rehab program and made excellent progress. She feels she's benefitted from participating in the program. She will continue walking on her treadmill and outdoors. She also has hand weights and exercise band for her resistance training.       Expected Outcomes Smita will continue daily walking to maintain health and fitness gains.                Nutrition & Weight - Outcomes:  Pre Biometrics - 03/07/23 1237       Pre Biometrics   Waist Circumference 34.5 inches    Hip Circumference 39 inches    Waist to Hip Ratio 0.88 %    Triceps Skinfold 13 mm    % Body Fat 32.6 %    Grip Strength 16 kg    Flexibility 14.75 in    Single Leg Stand 19 seconds             Post Biometrics - 05/17/23 1044        Post  Biometrics   Height 5\' 1"  (1.549 m)    Waist Circumference 36 inches    Hip Circumference 40 inches    Waist to Hip Ratio 0.9 %    Triceps Skinfold 13 mm    % Body Fat 33.6 %    Grip Strength 15 kg    Flexibility 15.5 in    Single Leg Stand 23.18 seconds             Nutrition:  Nutrition Therapy & Goals - 05/13/23 0901       Nutrition Therapy   Diet Heart healthy/Carbohydrate Consistent    Drug/Food Interactions Statins/Certain Fruits      Personal Nutrition Goals   Nutrition Goal Patient  to identify strategies for reducing cardiovascular risk by attending the [facility] education and nutrition series weekly.    Personal Goal #2 Patient to improve diet quality by using the plate method as a guide for meal planning to include lean protein/plant protein, fruits, vegetables, whole grains, nonfat dairy as part of a well-balanced diet.    Comments Goals in action. Smita continues to attend the [facility] education and nutrition classes regularly. Patient reports following a vegetarian diet lifelong; she does eat dairy products. She reports that she has reduced carbohydrate portions over  the last ~6 months and this has helped her A1c; she has previously worked with an outside RD. She is up about 5# since starting with our program. She will benefit from participation in intensive cardiac rehab for nutrition, exercise, and lifestyle modification.      Intervention Plan   Intervention Prescribe, educate and counsel regarding individualized specific dietary modifications aiming towards targeted core components such as weight, hypertension, lipid management, diabetes, heart failure and other comorbidities.;Nutrition handout(s) given to patient.    Expected Outcomes Short Term Goal: Understand basic principles of dietary content, such as calories, fat, sodium, cholesterol and nutrients.;Long Term Goal: Adherence to prescribed nutrition plan.             Nutrition Discharge:  Nutrition Assessments - 05/13/23 0855       Rate Your Plate Scores   Post Score 84             Education Questionnaire Score:  Knowledge Questionnaire Score - 05/09/23 0930       Knowledge Questionnaire Score   Pre Score 18/24    Post Score 19/24             Goals reviewed with patient; copy given to patient.Pt graduates from  Intensive/Traditional cardiac rehab program on 05/17/23  with completion of  57 exercise and education sessions. Pt maintained good attendance and progressed nicely during their  participation in rehab as evidenced by increased MET level. Smita increased her distance on her post exercise walk test by 318 feet.  Medication list reconciled. Repeat  PHQ score- 0  .  Pt has made significant lifestyle changes and should be commended for their success.  Smita  achieved their goals during cardiac rehab.   Pt plans to continue exercise at home by walking and using her treadmill. Smita said participating in cardiac rehab was very helpful she has her confidence back. Smita will continue to use her fitness tracker/ fitbit hopes to have at least 12,000 steps per day. We are proud of Smita's progress!Thayer Headings RN BSN

## 2023-05-17 NOTE — Progress Notes (Signed)
Office Visit    Patient Name: Wanda Medina Date of Encounter: 05/17/2023  Primary Care Provider:  Willow Ora, MD Primary Cardiologist:  Nanetta Batty, MD  Chief Complaint    72 year old female with a history of CAD s/p STEMI, DES-LCx in 01/2023, ICM, hypertension, hyperlipidemia, type 2 diabetes, and GERD who presents for follow-up related to CAD.   Past Medical History    Past Medical History:  Diagnosis Date   Allergy    Arthritis    Chronic kidney disease    AKI saw Nephrologist in [place]   Diabetes mellitus without complication (HCC)    GERD (gastroesophageal reflux disease)    Hyperlipemia    Hypertension    Past Surgical History:  Procedure Laterality Date   APPENDECTOMY     COLONOSCOPY     CORONARY/GRAFT ACUTE MI REVASCULARIZATION N/A 02/02/2023   Procedure: Coronary/Graft Acute MI Revascularization;  Surgeon: Runell Gess, MD;  Location: [facility];  Service: Cardiovascular;  Laterality: N/A;   LEFT HEART CATH AND CORONARY ANGIOGRAPHY N/A 02/02/2023   Procedure: LEFT HEART CATH AND CORONARY ANGIOGRAPHY;  Surgeon: Runell Gess, MD;  Location: [facility];  Service: Cardiovascular;  Laterality: N/A;   MEDIAL PARTIAL KNEE REPLACEMENT     Rt. knee in 05/2016   TRIGGER FINGER RELEASE     both hands    Allergies  Allergies  Allergen Reactions   Ace Inhibitors     cough   Altace [Ramipril] Cough   Lisinopril Cough     Labs/Other Studies Reviewed    The following studies were reviewed today:  Cardiac Studies & Procedures   CARDIAC CATHETERIZATION  CARDIAC CATHETERIZATION 02/02/2023  Narrative Images from the original result were not included.    Prox Cx to Mid Cx lesion is 99% stenosed.   Mid LAD lesion is 60% stenosed.   A drug-eluting stent was successfully placed using a SYNERGY XD 3.0X16.   Post intervention, there is a 0% residual stenosis.   The left ventricular systolic function is normal.   LV end  diastolic pressure is mildly elevated.   The left ventricular ejection fraction is 50-55% by visual estimate.  Wanda Medina is a 72 y.o. female   [id number] LOCATION:  FACILITY: [facility] PHYSICIAN: Nanetta Batty, M.D. August 18, 1951   DATE OF PROCEDURE:  02/02/2023  DATE OF DISCHARGE:     CARDIAC CATHETERIZATION    History obtained from chart review.  Ms. Freeze is a 72 year old married [place] female with history of hypertension, hyperlipidemia, non-insulin-requiring diabetes and family history for heart disease.  She has been having off-and-on chest pain for several days which was persistent this evening.  EMS was called.  EKG showed inferolateral ST segment elevation.  She was hemodynamically stable.  She was transported urgently to [facility] for angiography and potential intervention.   PROCEDURE DESCRIPTION:  The patient was brought to the second floor [place] [facility] cath lab in the postabsorptive state.  She was not premedicated .  Her right groinwas prepped and shaved in usual sterile fashion. Xylocaine 1% was used for local anesthesia. A 6 French sheath was inserted into the right common femoral artery using standard Seldinger technique.  Ultrasound was used to identify the right common femoral artery and guide access.  A digital image was captured and placed the patient's chart.  The patient received 6000 units  of heparin intravenously.  6 French right left Judkins diagnostic catheters were used for selective coronary angiography  and left ventriculography respectively.  Isovue dye is used for the entirety of the case (150 cc of contrast total to patient).  Retrograde aortic, ventricular and pullback pressures were recorded.  LVEDP was measured at 22 mmHg.  The patient received 180 mg of p.o. Brilinta.  The ending ACT was 347.  Isovue dye is used for the entirety of the intervention.  Retrograde ordered pressures monitored to the case.  Using a 6 [place] XB 3.0  cm guide catheter along with 0.14 [place] guidewire I crossed the proximal to mid dominant AV groove circumflex lesion with little difficulty.  Because of the thrombotic nature of the lesion I passed a 5.5 Pronto aspiration thrombectomy catheter and perform multiple passes.  I then predilated with a 2 mm x 12 mm balloon and stented with a 3 mm x 16 mm long Synergy drug-eluting stent deployed at 16 atm.  I postdilated the stent with a 3.25 mm x 12 mm long noncompliant balloon at 14 atm (3.33 cm) resulting in reduction of a 99% thrombotic AV groove circumflex lesion and a dominant vessel to 0% residual TIMI-3 flow.  The patient tolerated the procedure well.  I did give her Aggrastat bolus followed by a 6-hour infusion.  Impression Successful PCI drug-eluting stenting of proximal to mid AV groove circumflex and a dominant vessel with inferolateral ST segment elevation.  The door to balloon time was 25 minutes.  The LVEDP was measured at 22 mmHg.  The patient did have normal LV function.  She had 70% segmental calcified mid LAD stenosis after the second diagonal branch.  She will need 12 months of uninterrupted DAPT along with high-dose statin therapy, low-dose beta-blocker, SGLT2.  I anticipate early discharge within 48 hours (fast-track).  The sheath was sewn securely in place.  The patient left lab in stable condition.  Nanetta Batty. MD, [facility] 02/02/2023 5:40 AM  Findings Coronary Findings Diagnostic  Dominance: Left  Left Anterior Descending Mid LAD lesion is 60% stenosed. Vessel is not the culprit lesion.  Left Circumflex Prox Cx to Mid Cx lesion is 99% stenosed. Vessel is the culprit lesion. The lesion is heavily thrombotic.  Intervention  Prox Cx to Mid Cx lesion Stent Lesion crossed with guidewire. Pre-stent angioplasty was performed. A drug-eluting stent was successfully placed using a SYNERGY XD 3.0X16. Stent strut is well apposed. Stent does not overlap previously placed  stentPost-stent angioplasty was performed. Post-Intervention Lesion Assessment The intervention was successful. Pre-interventional TIMI flow is 2. Post-intervention TIMI flow is 3. No complications occurred at this lesion. There is a 0% residual stenosis post intervention.   STRESS TESTS  EXERCISE TOLERANCE TEST (ETT) 01/18/2021  Narrative  Blood pressure demonstrated a normal response to exercise.  There was no ST segment deviation noted during stress.  Negative, adequate stress test.   ECHOCARDIOGRAM  ECHOCARDIOGRAM COMPLETE 02/02/2023  Narrative ECHOCARDIOGRAM REPORT    Patient Name:   Wanda Medina Date of Exam: 02/02/2023 Medical Rec #:  [id number]                    Height:       61.0 in Accession #:    [id number]                   Weight:       114.0 lb Date of Birth:  07-29-1951                     BSA:  1.487 m Patient Age:    71 years                     BP:           170/105 mmHg Patient Gender: F                            HR:           78 bpm. Exam Location:  Inpatient  Procedure: 2D Echo, Cardiac Doppler and Color Doppler  Indications:    CAD of native vessel  History:        Patient has no prior history of Echocardiogram examinations. Signs/Symptoms:Chest Pain; Risk Factors:Hypertension and Dyslipidemia.  Sonographer:    Delcie Roch RDCS Referring Phys: [id number] EZEQUIEL D MUNOZ GONZALEZ   Sonographer Comments: Could not lie on left side due to recent cath. IMPRESSIONS   1. There is severe hypokinesis of the entire inferolateral LV wall and the mid-to-apical inferior LV wall. Left ventricular ejection fraction, by estimation, is 45 to 50%. The left ventricle has mildly decreased function. The left ventricle demonstrates regional wall motion abnormalities (see scoring diagram/findings for description). There is mild left ventricular hypertrophy of the basal-septal segment. Left ventricular diastolic parameters are consistent with  Grade I diastolic dysfunction (impaired relaxation). 2. Right ventricular systolic function is normal. The right ventricular size is normal. Tricuspid regurgitation signal is inadequate for assessing PA pressure. 3. The mitral valve is grossly normal. Mild mitral valve regurgitation. 4. The aortic valve is tricuspid. There is mild calcification of the aortic valve. There is mild thickening of the aortic valve. Aortic valve regurgitation is trivial. Aortic valve sclerosis/calcification is present, without any evidence of aortic stenosis.  Comparison(s): No prior Echocardiogram.  FINDINGS Left Ventricle: There is severe hypokinesis of the entire inferolateral LV wall and the mid-to-apical inferior LV wall. Left ventricular ejection fraction, by estimation, is 45 to 50%. The left ventricle has mildly decreased function. The left ventricle demonstrates regional wall motion abnormalities. The left ventricular internal cavity size was normal in size. There is mild left ventricular hypertrophy of the basal-septal segment. Left ventricular diastolic parameters are consistent with Grade I diastolic dysfunction (impaired relaxation).  Right Ventricle: The right ventricular size is normal. No increase in right ventricular wall thickness. Right ventricular systolic function is normal. Tricuspid regurgitation signal is inadequate for assessing PA pressure.  Left Atrium: Left atrial size was normal in size.  Right Atrium: Right atrial size was normal in size.  Pericardium: There is no evidence of pericardial effusion.  Mitral Valve: The mitral valve is grossly normal. There is mild thickening of the mitral valve leaflet(s). There is mild calcification of the mitral valve leaflet(s). Mild to moderate mitral annular calcification. Mild mitral valve regurgitation.  Tricuspid Valve: The tricuspid valve is normal in structure. Tricuspid valve regurgitation is not demonstrated.  Aortic Valve: The aortic valve  is tricuspid. There is mild calcification of the aortic valve. There is mild thickening of the aortic valve. Aortic valve regurgitation is trivial. Aortic valve sclerosis/calcification is present, without any evidence of aortic stenosis.  Pulmonic Valve: The pulmonic valve was normal in structure. Pulmonic valve regurgitation is trivial.  Aorta: The aortic root and ascending aorta are structurally normal, with no evidence of dilitation.  IAS/Shunts: The atrial septum is grossly normal.   LEFT VENTRICLE PLAX 2D LVIDd:         3.70 cm  Diastology LVIDs:         2.90 cm     LV e' medial:    5.55 cm/s LV PW:         0.90 cm     LV E/e' medial:  9.5 LV IVS:        1.10 cm     LV e' lateral:   4.68 cm/s LVOT diam:     1.70 cm     LV E/e' lateral: 11.3 LV SV:         24 LV SV Index:   16 LVOT Area:     2.27 cm  LV Volumes (MOD) LV vol d, MOD A2C: 41.6 ml LV vol d, MOD A4C: 46.9 ml LV vol s, MOD A2C: 21.8 ml LV vol s, MOD A4C: 26.4 ml LV SV MOD A2C:     19.8 ml LV SV MOD A4C:     46.9 ml LV SV MOD BP:      21.6 ml  RIGHT VENTRICLE            IVC RV Basal diam:  1.80 cm    IVC diam: 1.30 cm RV S prime:     9.90 cm/s  LEFT ATRIUM             Index        RIGHT ATRIUM          Index LA diam:        3.30 cm 2.22 cm/m   RA Area:     6.87 cm LA Vol (A2C):   34.2 ml 22.99 ml/m  RA Volume:   11.10 ml 7.46 ml/m LA Vol (A4C):   30.8 ml 20.71 ml/m LA Biplane Vol: 33.1 ml 22.25 ml/m AORTIC VALVE LVOT Vmax:   53.50 cm/s LVOT Vmean:  36.200 cm/s LVOT VTI:    0.107 m  AORTA Ao Root diam: 2.90 cm Ao Asc diam:  3.20 cm  MITRAL VALVE MV Area (PHT): 4.15 cm    SHUNTS MV Decel Time: 183 msec    Systemic VTI:  0.11 m MR Peak grad: 105.7 mmHg   Systemic Diam: 1.70 cm MR Mean grad: 65.0 mmHg MR Vmax:      514.00 cm/s MR Vmean:     382.0 cm/s MV E velocity: 52.90 cm/s MV A velocity: 87.50 cm/s MV E/A ratio:  0.60  Laurance Flatten MD Electronically signed by Laurance Flatten  MD Signature Date/Time: 02/02/2023/2:23:45 PM    Final            Recent Labs: 02/02/2023: ALT 49; B Natriuretic Peptide 78.8; TSH 4.991 02/03/2023: BUN 19; Creatinine, Ser 1.33; Hemoglobin 12.6; Platelets 249; Potassium 4.0; Sodium 133  Recent Lipid Panel    Component Value Date/Time   CHOL 111 02/05/2023 0224   TRIG 78 02/05/2023 0224   HDL 52 02/05/2023 0224   CHOLHDL 2.1 02/05/2023 0224   VLDL 16 02/05/2023 0224   LDLCALC 43 02/05/2023 0224    History of Present Illness   72 year old female with the above past medical history including CAD s/p STEMI, DES-LCx in 01/2023, ICM, hypertension, hyperlipidemia, type 2 diabetes, and GERD.   She has a strong family history of CAD.  She presented to the ED on 02/02/2023 with chest pain.  EKG in the field showed inferolateral STEMI.  She underwent emergent cardiac catheterization which revealed 99% proximal to mid left circumflex stenosis, s/p DES, 60% mid LAD stenosis, EF 50 to 55% by visual estimate.  Echocardiogram showed EF  45 to 50%, mildly decreased LV function, mild LVH with basal septal segment, G1 DD, normal RV function, mild mitral valve regurgitation, aortic sclerosis without evidence of stenosis.  She was started on aspirin, Brilinta, metoprolol, losartan, Farxiga, and Crestor.  She was last seen in the office on 02/15/2023 was stable from a cardiac standpoint.  She denies any symptoms concerning for angina.  BP was stable.  She was started on Entresto.  She presents today for follow-up accompanied by her daughter and sister. Since her last visit she has done well from a cardiac standpoint.  She denies any symptoms concerning for angina.  She graduated from cardiac rehab today.  She is looking forward to traveling to [place] in July to attend a family wedding.  Overall, she reports feeling well.    Home Medications    Current Outpatient Medications  Medication Sig Dispense Refill   aspirin EC 81 MG tablet Take 1 tablet (81 mg total) by  mouth daily. Swallow whole. 90 tablet 3   calcium-vitamin D (OSCAL WITH D) 500-200 MG-UNIT tablet Take 1 tablet by mouth daily.     cyclobenzaprine (FLEXERIL) 5 MG tablet Take 1 tablet (5 mg total) by mouth 2 (two) times daily as needed for muscle spasms. 180 tablet 3   dapagliflozin propanediol (FARXIGA) 10 MG TABS tablet Take 1 tablet (10 mg total) by mouth daily before breakfast. 90 tablet 3   Dulaglutide (TRULICITY) 3 MG/0.5ML SOPN Inject 3 mg as directed once a week. 2 mL 5   ENTRESTO 24-26 MG TAKE 1 TABLET BY MOUTH TWICE A DAY 60 tablet 3   ferrous sulfate 324 (65 Fe) MG TBEC Take 324 mg by mouth daily.     fluticasone (FLONASE) 50 MCG/ACT nasal spray Place 2 sprays into both nostrils daily. 16 g 6   gabapentin (NEURONTIN) 100 MG capsule Take 1 capsule (100 mg total) by mouth 2 (two) times daily. 180 capsule 3   Lancets (BD LANCET ULTRAFINE 30G) MISC 1 application by Does not apply route in the morning and at bedtime. 360 each 3   metoprolol succinate (TOPROL-XL) 25 MG 24 hr tablet Take 1 tablet (25 mg total) by mouth daily. 90 tablet 3   montelukast (SINGULAIR) 10 MG tablet TAKE 1 TABLET BY MOUTH EVERYDAY AT BEDTIME (Patient taking differently: Take 10 mg by mouth at bedtime.) 90 tablet 0   nitroGLYCERIN (NITROSTAT) 0.4 MG SL tablet Place 1 tablet (0.4 mg total) under the tongue every 5 (five) minutes x 3 doses as needed for chest pain. 25 tablet 1   omeprazole (PRILOSEC) 20 MG capsule Take 1 capsule (20 mg total) by mouth daily. 90 capsule 3   ONETOUCH ULTRA test strip TEST BLOOD SUGARS TWICE DAILY AS NEEDED. 100 strip 3   rosuvastatin (CRESTOR) 40 MG tablet Take 1 tablet (40 mg total) by mouth daily. 90 tablet 3   ticagrelor (BRILINTA) 90 MG TABS tablet Take 1 tablet (90 mg total) by mouth 2 (two) times daily. 180 tablet 3   TRULICITY 0.75 MG/0.5ML SOPN Inject 0.75 mg into the skin once a week. 2 mL 5   TRULICITY 1.5 MG/0.5ML SOPN Inject 1.5 mg into the skin once a week.     zinc  gluconate 50 MG tablet Take 50 mg by mouth daily.     pseudoephedrine (SUDAFED 12 HOUR) 120 MG 12 hr tablet Take 1 tablet (120 mg total) by mouth 2 (two) times daily. (Patient not taking: Reported on 05/17/2023) 20 tablet 0  Saline (SIMPLY SALINE) 0.9 % AERS Place 2 each into the nose daily as needed. (Patient not taking: Reported on 05/17/2023) 500 mL 1   No current facility-administered medications for this visit.     Review of Systems    She denies chest pain, palpitations, dyspnea, pnd, orthopnea, n, v, dizziness, syncope, edema, weight gain, or early satiety. All other systems reviewed and are otherwise negative except as noted above.   Physical Exam    VS:  BP 122/64 (BP Location: Left Arm, Patient Position: Sitting, Cuff Size: Normal)   Pulse 96   Ht 5' 1.5" (1.562 m)   Wt 121 lb 3.2 oz (55 kg)   LMP  (LMP Unknown)   SpO2 98%   BMI 22.53 kg/m  GEN: Well nourished, well developed, in no acute distress. HEENT: normal. Neck: Supple, no JVD, carotid bruits, or masses. Cardiac: RRR, no murmurs, rubs, or gallops. No clubbing, cyanosis, edema.  Radials/DP/PT 2+ and equal bilaterally.  Respiratory:  Respirations regular and unlabored, clear to auscultation bilaterally. GI: Soft, nontender, nondistended, BS + x 4. MS: no deformity or atrophy. Skin: warm and dry, no rash. Neuro:  Strength and sensation are intact. Psych: Normal affect.  Accessory Clinical Findings    ECG personally reviewed by me today - No EKG in office today.  Lab Results  Component Value Date   WBC 7.2 02/03/2023   HGB 12.6 02/03/2023   HCT 38.8 02/03/2023   MCV 93.3 02/03/2023   PLT 249 02/03/2023   Lab Results  Component Value Date   CREATININE 1.33 (H) 02/03/2023   BUN 19 02/03/2023   NA 133 (L) 02/03/2023   K 4.0 02/03/2023   CL 104 02/03/2023   CO2 20 (L) 02/03/2023   Lab Results  Component Value Date   ALT 49 (H) 02/02/2023   AST 373 (H) 02/02/2023   ALKPHOS 64 02/02/2023   BILITOT 0.9  02/02/2023   Lab Results  Component Value Date   CHOL 111 02/05/2023   HDL 52 02/05/2023   LDLCALC 43 02/05/2023   TRIG 78 02/05/2023   CHOLHDL 2.1 02/05/2023    Lab Results  Component Value Date   HGBA1C 6.9 (H) 02/02/2023    Assessment & Plan    1. CAD: S/p STEMI, DES-LCx in 01/2023. Stable with no anginal symptoms.  Continue aspirin, Brilinta, metoprolol, Entresto, Farxiga, and Crestor.   2. ICM: Echo in 01/2023 showed EF 45 to 50%, mildly decreased LV function, mild LVH with basal septal segment, G1 DD, normal RV function, mild mitral valve regurgitation, aortic sclerosis without evidence of stenosis.  Euvolemic and well compensated on exam.  Will repeat limited echo to reevaluate LVEF.  Otherwise, continue current medications as above.   3. Hypertension: BP well controlled. Continue current antihypertensive regimen with above changes.   4. Hyperlipidemia: LDL was 43 in 01/2023.  Will update CMET today.  Continue Crestor.   5. Type 2 diabetes: A1c was 6.9 in 01/2023.  Monitored and managed per PCP.   6. Disposition: Follow-up in 6 months.     Joylene Grapes, NP 05/17/2023, 5:20 PM

## 2023-05-17 NOTE — Patient Instructions (Addendum)
Medication Instructions:  Your physician recommends that you continue on your current medications as directed. Please refer to the Current Medication list given to you today.  *If you need a refill on your cardiac medications before your next appointment, please call your pharmacy*   Lab Work: CMET today.    Testing/Procedures: Your physician has requested that you have an echocardiogram. Echocardiography is a painless test that uses sound waves to create images of your heart. It provides your doctor with information about the size and shape of your heart and how well your heart's chambers and valves are working. This procedure takes approximately one hour. There are no restrictions for this procedure. Please do NOT wear cologne, perfume, aftershave, or lotions (deodorant is allowed). Please arrive 15 minutes prior to your appointment time.     Follow-Up: At [facility], you and your health needs are our priority.  As part of our continuing mission to provide you with exceptional heart care, we have created designated Provider Care Teams.  These Care Teams include your primary Cardiologist (physician) and Advanced Practice Providers (APPs -  Physician Assistants and Nurse Practitioners) who all work together to provide you with the care you need, when you need it.  We recommend signing up for the patient portal called "MyChart".  Sign up information is provided on this After Visit Summary.  MyChart is used to connect with patients for Virtual Visits (Telemedicine).  Patients are able to view lab/test results, encounter notes, upcoming appointments, etc.  Non-urgent messages can be sent to your provider as well.   To learn more about what you can do with MyChart, go to [email or web address].    Your next appointment:   6 month(s)  Provider:   Nanetta Batty, MD     Other Instructions

## 2023-05-18 LAB — COMPREHENSIVE METABOLIC PANEL
ALT: 39 IU/L — ABNORMAL HIGH (ref 0–32)
AST: 41 IU/L — ABNORMAL HIGH (ref 0–40)
Albumin/Globulin Ratio: 1.5 (ref 1.2–2.2)
Albumin: 4 g/dL (ref 3.8–4.8)
Alkaline Phosphatase: 64 IU/L (ref 44–121)
BUN/Creatinine Ratio: 21 (ref 12–28)
BUN: 40 mg/dL — ABNORMAL HIGH (ref 8–27)
Bilirubin Total: <0.2 mg/dL (ref 0.0–1.2)
CO2: 21 mmol/L (ref 20–29)
Calcium: 9.2 mg/dL (ref 8.7–10.3)
Chloride: 105 mmol/L (ref 96–106)
Creatinine, Ser: 1.91 mg/dL — ABNORMAL HIGH (ref 0.57–1.00)
Globulin, Total: 2.6 g/dL (ref 1.5–4.5)
Glucose: 236 mg/dL — ABNORMAL HIGH (ref 70–99)
Potassium: 4.4 mmol/L (ref 3.5–5.2)
Sodium: 140 mmol/L (ref 134–144)
Total Protein: 6.6 g/dL (ref 6.0–8.5)
eGFR: 27 mL/min/{1.73_m2} — ABNORMAL LOW (ref >59)

## 2023-05-24 ENCOUNTER — Encounter: Payer: Self-pay | Admitting: Family Medicine

## 2023-05-24 ENCOUNTER — Ambulatory Visit: Payer: BC Managed Care – PPO | Admitting: Family Medicine

## 2023-05-24 VITALS — BP 120/70 | HR 97 | Temp 97.9°F | Ht 61.5 in | Wt 121.2 lb

## 2023-05-24 DIAGNOSIS — E1159 Type 2 diabetes mellitus with other circulatory complications: Secondary | ICD-10-CM

## 2023-05-24 DIAGNOSIS — M8580 Other specified disorders of bone density and structure, unspecified site: Secondary | ICD-10-CM

## 2023-05-24 DIAGNOSIS — E1122 Type 2 diabetes mellitus with diabetic chronic kidney disease: Secondary | ICD-10-CM | POA: Diagnosis not present

## 2023-05-24 DIAGNOSIS — Z7985 Long-term (current) use of injectable non-insulin antidiabetic drugs: Secondary | ICD-10-CM

## 2023-05-24 DIAGNOSIS — E1142 Type 2 diabetes mellitus with diabetic polyneuropathy: Secondary | ICD-10-CM

## 2023-05-24 DIAGNOSIS — N1832 Chronic kidney disease, stage 3b: Secondary | ICD-10-CM

## 2023-05-24 DIAGNOSIS — E785 Hyperlipidemia, unspecified: Secondary | ICD-10-CM

## 2023-05-24 DIAGNOSIS — I152 Hypertension secondary to endocrine disorders: Secondary | ICD-10-CM

## 2023-05-24 DIAGNOSIS — I255 Ischemic cardiomyopathy: Secondary | ICD-10-CM | POA: Insufficient documentation

## 2023-05-24 DIAGNOSIS — I213 ST elevation (STEMI) myocardial infarction of unspecified site: Secondary | ICD-10-CM

## 2023-05-24 LAB — POCT GLYCOSYLATED HEMOGLOBIN (HGB A1C): Hemoglobin A1C: 7.5 % — AB (ref 4.0–5.6)

## 2023-05-24 MED ORDER — TRULICITY 3 MG/0.5ML ~~LOC~~ SOAJ: 3.0000 mg | SUBCUTANEOUS | 3 refills | Status: DC

## 2023-05-24 NOTE — Progress Notes (Signed)
Subjective  CC:  Chief Complaint  Patient presents with   Diabetes    HPI: Wanda Medina is a 72 y.o. female who presents to the office today for follow up of diabetes and problems listed above in the chief complaint.  Diabetes follow up: Her diabetic control is reported as Worse.  She just completed cardiac rehab.  Unable to walk 10 miles daily, walking 2 to 5 mg/day.  This decrease in activity has caused her sugars to rise.  No symptoms but recent nonfasting sugar was 238 at cardiology office.  She denies exertional CP or SOB or symptomatic hypoglycemia. She denies foot sores or paresthesias.  She is taking Trulicity 1.5 mg weekly and Jardiance 10 mg daily.  No adverse effects Cardiology notes reviewed: Stable ischemic cardiomyopathy on Entresto, for follow-up echocardiogram next month.  She denies chest pain or ischemic symptoms.  Completed cardiac rehab after myocardial infarction.  No palpitations or edema.  Blood pressure has been well-controlled and tolerating medications.  Continues on aspirin and Brilinta Neuropathy remains controlled on gabapentin Tolerating statin, LFTs have returned to baseline and mildly elevated. Chronic kidney disease, baseline 3B, most recently checked and GFR down to 27.  May be related to volume status and Entresto.  No symptoms.  She is on Jardiance. Osteopenia due for recheck bone density.  Wt Readings from Last 3 Encounters:  05/24/23 121 lb 3.2 oz (55 kg)  05/17/23 120 lb 2.4 oz (54.5 kg)  05/17/23 121 lb 3.2 oz (55 kg)    BP Readings from Last 3 Encounters:  05/24/23 120/70  05/17/23 114/66  05/17/23 122/64    Assessment  1. Type 2 diabetes mellitus with stage 3b chronic kidney disease, without long-term current use of insulin (HCC)   2. Long-term (current) use of injectable non-insulin antidiabetic drugs   3. Diabetic peripheral neuropathy (HCC)   4. Hyperlipidemia with target LDL less than 70   5. Hypertension associated  with diabetes (HCC)   6. ST elevation myocardial infarction (STEMI), unspecified artery (HCC)   7. Ischemic cardiomyopathy   8. Osteopenia, unspecified location      Plan  Diabetes is currently marginally controlled.  Want A1c to be less than 7.0.  Increase Trulicity to 3 mg weekly.  Continue activity.  Recheck 3 months Chronic kidney disease secondary to diabetes: Mildly worse, may be volume and/or medication related.  Will monitor closely.  Recheck 3 months. Continue gabapentin for neuropathy Continue statin for hyperlipidemia, LDL is at goal.  Monitor LFTs.  Currently stable No signs of ischemia Follow-up ischemic cardiomyopathy with echocardiogram next month. Bone density ordered  Follow up: 3 months for complete physical and recheck Orders Placed This Encounter  Procedures   DG Bone Density   POCT HgB A1C   Meds ordered this encounter  Medications   Dulaglutide (TRULICITY) 3 MG/0.5ML SOPN    Sig: Inject 3 mg as directed once a week.    Dispense:  6 mL    Refill:  3      Immunization History  Administered Date(s) Administered   Fluad Quad(high Dose 65+) 10/11/2021   Influenza, High Dose Seasonal PF 12/18/2018   Influenza-Unspecified 11/20/2019, 09/18/2020   PFIZER(Purple Top)SARS-COV-2 Vaccination 01/23/2020, 02/17/2020, 09/18/2020, 07/16/2021   Pfizer Covid-19 Vaccine Bivalent Booster 36yrs & up 11/04/2021   Pneumococcal Conjugate-13 09/09/2016   Pneumococcal Polysaccharide-23 10/27/2018   Zoster Recombinat (Shingrix) 08/24/2019, 11/20/2019    Diabetes Related Lab Review: Lab Results  Component Value Date   HGBA1C 7.5 (A)  05/24/2023   HGBA1C 6.9 (H) 02/02/2023   HGBA1C 7.1 (A) 11/19/2022    Lab Results  Component Value Date   MICROALBUR 4.0 (H) 08/22/2022   Lab Results  Component Value Date   CREATININE 1.91 (H) 05/17/2023   BUN 40 (H) 05/17/2023   NA 140 05/17/2023   K 4.4 05/17/2023   CL 105 05/17/2023   CO2 21 05/17/2023   Lab Results  Component  Value Date   CHOL 111 02/05/2023   CHOL 161 08/22/2022   CHOL 146 08/01/2021   Lab Results  Component Value Date   HDL 52 02/05/2023   HDL 77.90 08/22/2022   HDL 63.00 08/01/2021   Lab Results  Component Value Date   LDLCALC 43 02/05/2023   LDLCALC 70 08/22/2022   LDLCALC 66 08/01/2021   Lab Results  Component Value Date   TRIG 78 02/05/2023   TRIG 69.0 08/22/2022   TRIG 85.0 08/01/2021   Lab Results  Component Value Date   CHOLHDL 2.1 02/05/2023   CHOLHDL 2 08/22/2022   CHOLHDL 2 08/01/2021   No results found for: "LDLDIRECT" The ASCVD Risk score (Arnett DK, et al., 2019) failed to calculate for the following reasons:   The patient has a prior MI or stroke diagnosis I have reviewed the PMH, Fam and Soc history. Patient Active Problem List   Diagnosis Date Noted   Diabetic peripheral neuropathy (HCC) 10/31/2021    Priority: High   Diabetic retinopathy of both eyes without macular edema associated with type 2 diabetes mellitus (HCC) 08/08/2021    Priority: High   Hyperlipidemia with target LDL less than 70 08/01/2021    Priority: High   Type 2 diabetes mellitus with diabetic chronic kidney disease (HCC) 05/18/2019    Priority: High    Baseline creatinine: 1.3-1.4    Hypertension associated with diabetes (HCC) 10/27/2018    Priority: High   Osteoarthritis of knee, unspecified 11/12/2018    Priority: Medium    Osteopenia 11/12/2018    Priority: Medium     Dexa: 04/2020. tscore of -1.3. FRAX: 1.2%/9.3%. repeat 3 years. 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 She has never used smokeless tobacco. She reports that she does not drink alcohol and does not use drugs.  Review of Systems: Ophthalmic: negative for eye pain, loss of vision or double vision Cardiovascular: negative for chest pain Respiratory: negative for SOB or persistent cough Gastrointestinal: negative for abdominal pain Genitourinary: negative for dysuria or gross hematuria MSK: negative for foot lesions Neurologic: negative for weakness or gait disturbance  Objective  Vitals: BP 120/70   Pulse 97   Temp 97.9 F (36.6 C)   Ht 5' 1.5" (1.562 m)   Wt 121 lb 3.2 oz (55 kg)   LMP  (LMP Unknown)   SpO2 97%   BMI 22.53 kg/m  General: well appearing, no acute distress  Psych:  Alert  and oriented, normal mood and affect HEENT:  Normocephalic, atraumatic, moist mucous membranes, supple neck  Cardiovascular:  Nl S1 and S2, RRR without murmur, gallop or rub. no edema Respiratory:  Good breath sounds bilaterally, CTAB with normal effort, no rales   Diabetic education: ongoing education regarding chronic disease management for diabetes was given today. We continue to reinforce the ABC's of diabetic management: A1c (<7 or 8 dependent upon patient), tight blood pressure control, and cholesterol management with goal LDL < 100 minimally. We discuss diet strategies, exercise recommendations, medication options and possible side effects. At each visit, we review recommended immunizations and preventive care recommendations for diabetics and stress that good diabetic control can prevent other problems. See below for this patient's data.   Commons side effects, risks, benefits, and alternatives for medications and treatment plan prescribed today were discussed, and the patient expressed understanding of the  given instructions. Patient is instructed to call or message via MyChart if he/she has any questions or concerns regarding our treatment plan. No barriers to understanding were identified. We discussed Red Flag symptoms and signs in detail. Patient expressed understanding regarding what to do in case of urgent or emergency type symptoms.  Medication list was reconciled, printed and provided to the patient in AVS. Patient instructions and summary information was reviewed with the patient as documented in the AVS. This note was prepared with assistance of Dragon voice recognition software. Occasional wrong-word or sound-a-like substitutions may have occurred due to the inherent limitations of voice recognition software

## 2023-05-24 NOTE — Patient Instructions (Signed)
Please return in 3 months for your annual complete physical; please come fasting.    If you have any questions or concerns, please don't hesitate to send me a message via MyChart or call the office at [phone]. Thank you for visiting with [place] today! It's our pleasure caring for you.   Please call the office checked below to schedule your appointment for your mammogram and/or bone density screen (the checked studies were ordered): both are due in August. [x]   Mammogram  [x]   Bone Density  [x]   [facility]     [address] [place], [place]        [phone]         []   [facility]  [address] [place], [place]  [phone]

## 2023-05-29 ENCOUNTER — Telehealth: Payer: Self-pay

## 2023-05-29 NOTE — Telephone Encounter (Signed)
Spoke with pt and pts daughter Minal. Pt was notified of lab results. Pt is going to discuss options of staying on Entresto with repeat labs in 1 month or d/c Entresto, start Losartan 25 mg and repeat labs in 2-4 weeks. Pt will will call back or send mychart message after she talks with her daughter.

## 2023-06-26 ENCOUNTER — Ambulatory Visit (HOSPITAL_COMMUNITY): Payer: BC Managed Care – PPO | Attending: Nurse Practitioner

## 2023-06-26 DIAGNOSIS — I255 Ischemic cardiomyopathy: Secondary | ICD-10-CM | POA: Insufficient documentation

## 2023-06-26 LAB — ECHOCARDIOGRAM LIMITED
Area-P 1/2: 4.63 cm2
S' Lateral: 2.1 cm

## 2023-06-28 ENCOUNTER — Telehealth: Payer: Self-pay

## 2023-06-28 NOTE — Telephone Encounter (Signed)
Lmom to discuss echo results. Waiting on a return call. 

## 2023-07-01 NOTE — Telephone Encounter (Signed)
Left voicemail message to call the clinic. 

## 2023-07-01 NOTE — Telephone Encounter (Signed)
Patient returned call for results 

## 2023-07-02 NOTE — Telephone Encounter (Signed)
The patient has been notified of the result and verbalized understanding.  All questions (if any) were answered. Asencion Gowda, LPN 03/18/8118 1:47 AM

## 2023-07-14 ENCOUNTER — Other Ambulatory Visit: Payer: Self-pay | Admitting: Nurse Practitioner

## 2023-07-18 LAB — HM MAMMOGRAPHY

## 2023-07-25 ENCOUNTER — Encounter (INDEPENDENT_AMBULATORY_CARE_PROVIDER_SITE_OTHER): Payer: Self-pay

## 2023-07-26 ENCOUNTER — Ambulatory Visit
Admission: RE | Admit: 2023-07-26 | Discharge: 2023-07-26 | Disposition: A | Discharge status: Home / Self Care | Payer: BC Managed Care – PPO | Source: Ambulatory Visit | Attending: Family Medicine | Admitting: Family Medicine

## 2023-07-26 DIAGNOSIS — M8580 Other specified disorders of bone density and structure, unspecified site: Secondary | ICD-10-CM

## 2023-08-04 ENCOUNTER — Other Ambulatory Visit: Payer: Self-pay | Admitting: Family Medicine

## 2023-08-14 NOTE — Progress Notes (Signed)
See mychart note.  Dexa: 04/2020. tscore of -1.3. FRAX: 1.2%/9.3%. repeat 3 years. Continue calcium/vitamin D  DEXA: 08.2024 lowest T = -1.4; ostepenia, stable. No change. Repeat in 2 years.

## 2023-09-03 ENCOUNTER — Ambulatory Visit (INDEPENDENT_AMBULATORY_CARE_PROVIDER_SITE_OTHER): Payer: BC Managed Care – PPO | Admitting: Family Medicine

## 2023-09-03 ENCOUNTER — Encounter: Payer: Self-pay | Admitting: Family Medicine

## 2023-09-03 VITALS — BP 124/64 | HR 80 | Temp 97.8°F | Ht 61.5 in | Wt 124.8 lb

## 2023-09-03 DIAGNOSIS — Z794 Long term (current) use of insulin: Secondary | ICD-10-CM

## 2023-09-03 DIAGNOSIS — N1832 Chronic kidney disease, stage 3b: Secondary | ICD-10-CM

## 2023-09-03 DIAGNOSIS — Z0001 Encounter for general adult medical examination with abnormal findings: Secondary | ICD-10-CM | POA: Diagnosis not present

## 2023-09-03 DIAGNOSIS — E785 Hyperlipidemia, unspecified: Secondary | ICD-10-CM

## 2023-09-03 DIAGNOSIS — I255 Ischemic cardiomyopathy: Secondary | ICD-10-CM

## 2023-09-03 DIAGNOSIS — I252 Old myocardial infarction: Secondary | ICD-10-CM

## 2023-09-03 DIAGNOSIS — E1122 Type 2 diabetes mellitus with diabetic chronic kidney disease: Secondary | ICD-10-CM | POA: Diagnosis not present

## 2023-09-03 DIAGNOSIS — E1142 Type 2 diabetes mellitus with diabetic polyneuropathy: Secondary | ICD-10-CM | POA: Diagnosis not present

## 2023-09-03 DIAGNOSIS — Z23 Encounter for immunization: Secondary | ICD-10-CM | POA: Diagnosis not present

## 2023-09-03 DIAGNOSIS — I152 Hypertension secondary to endocrine disorders: Secondary | ICD-10-CM

## 2023-09-03 DIAGNOSIS — Z0000 Encounter for general adult medical examination without abnormal findings: Secondary | ICD-10-CM

## 2023-09-03 DIAGNOSIS — E11319 Type 2 diabetes mellitus with unspecified diabetic retinopathy without macular edema: Secondary | ICD-10-CM

## 2023-09-03 DIAGNOSIS — E538 Deficiency of other specified B group vitamins: Secondary | ICD-10-CM | POA: Diagnosis not present

## 2023-09-03 DIAGNOSIS — E1159 Type 2 diabetes mellitus with other circulatory complications: Secondary | ICD-10-CM | POA: Diagnosis not present

## 2023-09-03 LAB — COMPREHENSIVE METABOLIC PANEL
ALT: 38 U/L — ABNORMAL HIGH (ref 0–35)
AST: 36 U/L (ref 0–37)
Albumin: 4 g/dL (ref 3.5–5.2)
Alkaline Phosphatase: 55 U/L (ref 39–117)
BUN: 30 mg/dL — ABNORMAL HIGH (ref 6–23)
CO2: 26 mEq/L (ref 19–32)
Calcium: 9.7 mg/dL (ref 8.4–10.5)
Chloride: 105 mEq/L (ref 96–112)
Creatinine, Ser: 1.88 mg/dL — ABNORMAL HIGH (ref 0.40–1.20)
GFR: 26.22 mL/min — ABNORMAL LOW (ref >60.00)
Glucose, Bld: 121 mg/dL — ABNORMAL HIGH (ref 70–99)
Potassium: 4.2 mEq/L (ref 3.5–5.1)
Sodium: 139 mEq/L (ref 135–145)
Total Bilirubin: 0.4 mg/dL (ref 0.2–1.2)
Total Protein: 7.1 g/dL (ref 6.0–8.3)

## 2023-09-03 LAB — CBC WITH DIFFERENTIAL/PLATELET
Basophils Absolute: 0 10*3/uL (ref 0.0–0.1)
Basophils Relative: 0.7 % (ref 0.0–3.0)
Eosinophils Absolute: 0.2 10*3/uL (ref 0.0–0.7)
Eosinophils Relative: 5.6 % — ABNORMAL HIGH (ref 0.0–5.0)
HCT: 37.1 % (ref 36.0–46.0)
Hemoglobin: 12.1 g/dL (ref 12.0–15.0)
Lymphocytes Relative: 27.3 % (ref 12.0–46.0)
Lymphs Abs: 1.1 10*3/uL (ref 0.7–4.0)
MCHC: 32.7 g/dL (ref 30.0–36.0)
MCV: 94 fl (ref 78.0–100.0)
Monocytes Absolute: 0.4 10*3/uL (ref 0.1–1.0)
Monocytes Relative: 9 % (ref 3.0–12.0)
Neutro Abs: 2.4 10*3/uL (ref 1.4–7.7)
Neutrophils Relative %: 57.4 % (ref 43.0–77.0)
Platelets: 177 10*3/uL (ref 150.0–400.0)
RBC: 3.94 Mil/uL (ref 3.87–5.11)
RDW: 13.1 % (ref 11.5–15.5)
WBC: 4.2 10*3/uL (ref 4.0–10.5)

## 2023-09-03 LAB — LIPID PANEL
Cholesterol: 141 mg/dL (ref 0–200)
HDL: 78.4 mg/dL (ref >39.00)
LDL Cholesterol: 48 mg/dL (ref 0–99)
NonHDL: 62.15
Total CHOL/HDL Ratio: 2
Triglycerides: 73 mg/dL (ref 0.0–149.0)
VLDL: 14.6 mg/dL (ref 0.0–40.0)

## 2023-09-03 LAB — MICROALBUMIN / CREATININE URINE RATIO
Creatinine,U: 44.6 mg/dL
Microalb Creat Ratio: 22.1 mg/g (ref 0.0–30.0)
Microalb, Ur: 9.8 mg/dL — ABNORMAL HIGH (ref 0.0–1.9)

## 2023-09-03 LAB — HEMOGLOBIN A1C: Hgb A1c MFr Bld: 7.8 % — ABNORMAL HIGH (ref 4.6–6.5)

## 2023-09-03 LAB — TSH: TSH: 2.09 u[IU]/mL (ref 0.35–5.50)

## 2023-09-03 LAB — VITAMIN D 25 HYDROXY (VIT D DEFICIENCY, FRACTURES): VITD: 54.26 ng/mL (ref 30.00–100.00)

## 2023-09-03 LAB — VITAMIN B12: Vitamin B-12: 1088 pg/mL — ABNORMAL HIGH (ref 211–911)

## 2023-09-03 NOTE — Progress Notes (Signed)
Subjective   Chief Complaint  Patient presents with   Annual Exam    Pt here for Annual Exam  and is currently fasting    Diabetes    HPI: Wanda Medina is a 72 y.o. female who presents to [facility] [place] today for a Female Wellness Visit. She also has the concerns and/or needs as listed above in the chief complaint. These will be addressed in addition to the Health Maintenance Visit.   Wellness Visit: annual visit with health maintenance review and exam  HM: doing well! Walking 5 miles per day. Gardens and embroiders. Had gyn exam w/ mammo. Calling for records (8/02/024). Other screens current. Eligible for flu,covid and rsv.   Chronic disease f/u and/or acute problem visit: (deemed necessary to be done in addition to the wellness visit): Type 2 diabetes: On Trulicity 3 mg weekly and Farxiga 10 mg daily.  Reports fasting sugars range between 90 and 110.  Feeling very well.  Energy levels are better.  Active again.  Diet is good.  Weight is back to her baseline.  Eye exam is up-to-date with retinopathy.  Also has peripheral diabetic neuropathy on gabapentin.  Reports good control.  No significant pain.  No foot sores.  No side effects from her medications. Ischemic cardiomyopathy due to history of STEMI February 2024.  Follows with cardiology closely.  Active again.  No shortness of breath or chest pain.  Remains on multiple cardiology medications including Entresto, Brilinta. Hyperlipidemia on statin well-tolerated.  Fasting for recheck GERD on chronic PPI with history of B12 deficiency.  This medical condition is well controlled. There are no signs of complications, medication side effects, or red flags. Patient is instructed to continue the current treatment plan without change in therapies or medications. Hypertension on multiple medications with good control.  Assessment  1. Encounter for well adult exam with abnormal findings   2. Type 2 diabetes  mellitus with stage 3b chronic kidney disease, without long-term current use of insulin (HCC)   3. Hypertension associated with diabetes (HCC)   4. Hyperlipidemia with target LDL less than 70   5. Diabetic retinopathy of both eyes without macular edema associated with type 2 diabetes mellitus, unspecified retinopathy severity (HCC)   6. Diabetic peripheral neuropathy (HCC)   7. B12 deficiency   8. Ischemic cardiomyopathy   9. Need for influenza vaccination   10. History of ST elevation myocardial infarction (STEMI)      Plan  Female Wellness Visit: Age appropriate Health Maintenance and Prevention measures were discussed with patient. Included topics are cancer screening recommendations, ways to keep healthy (see AVS) including dietary and exercise recommendations, regular eye and dental care, use of seat belts, and avoidance of moderate alcohol use and tobacco use.  Screens are current BMI: discussed patient's BMI and encouraged positive lifestyle modifications to help get to or maintain a target BMI. HM needs and immunizations were addressed and ordered. See below for orders. See HM and immunization section for updates.  Flu shot given today.  Patient to get from pharmacy RSV and COVID booster Routine labs and screening tests ordered including cmp, cbc and lipids where appropriate. Discussed recommendations regarding Vit D and calcium supplementation (see AVS)  Chronic disease management visit and/or acute problem visit: Type 2 diabetes: Hopefully control is now good again.  Continue Trulicity 3 and Farxiga 10.  Continue walking.  Check A1c.  Continue gabapentin for neuropathy.  Continue routine eye exams for retinopathy.  Will recheck in 3 to 6 months depending on A1c levels. Hypertension is well-controlled. Ischemic cardiomyopathy per cardiology.  No symptoms of heart failure.  Continues on [place]. Coronary artery disease disease: Stable Hyperlipidemia on statin: Recheck fasting lipids  today and LFTs Monitor trend vitamin D level with history of deficiency Monitoring B12 levels on chronic PPIs Continue omeprazole daily for GERD  Follow up: 6 months to recheck diabetes and blood pressure Orders Placed This Encounter  Procedures   Flu Vaccine Trivalent High Dose (Fluad)   CBC with Differential/Platelet   Comprehensive metabolic panel   Lipid panel   Hemoglobin A1c   TSH   Microalbumin / creatinine urine ratio   VITAMIN D 25 Hydroxy (Vit-D Deficiency, Fractures)   Vitamin B12   No orders of the defined types were placed in this encounter.     Body mass index is 23.2 kg/m. Wt Readings from Last 3 Encounters:  09/03/23 124 lb 12.8 oz (56.6 kg)  05/24/23 121 lb 3.2 oz (55 kg)  05/17/23 120 lb 2.4 oz (54.5 kg)     Patient Active Problem List   Diagnosis Date Noted Date Diagnosed   History of ST elevation myocardial infarction (STEMI) 09/03/2023     Priority: High    february 2024    Ischemic cardiomyopathy 05/24/2023     Priority: High   Diabetic peripheral neuropathy (HCC) 10/31/2021     Priority: High   Diabetic retinopathy of both eyes without macular edema associated with type 2 diabetes mellitus (HCC) 08/08/2021     Priority: High   Hyperlipidemia with target LDL less than 70 08/01/2021     Priority: High   Type 2 diabetes mellitus with diabetic chronic kidney disease (HCC) 05/18/2019     Priority: High    Baseline creatinine: 1.3-1.4    Hypertension associated with diabetes (HCC) 10/27/2018     Priority: High   Osteoarthritis of knee, unspecified 11/12/2018     Priority: Medium    Osteopenia 11/12/2018     Priority: Medium     Dexa: 04/2020. tscore of -1.3. FRAX: 1.2%/9.3%. repeat 3 years. Continue calcium/vitamin D  DEXA: 08.2024 lowest T = -1.4; ostepenia, stable. No change. Repeat in 2 years.     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    Acute ST elevation myocardial infarction (STEMI) of inferior wall (HCC) 02/02/2023    Acute ST elevation myocardial infarction (STEMI) due to occlusion of circumflex coronary artery (HCC) 02/02/2023    Health Maintenance  Topic Date Due   Diabetic kidney evaluation - Urine ACR  08/23/2023   COVID-19 Vaccine (6 - 2023-24 season) 09/19/2023 (Originally 08/11/2023)   HEMOGLOBIN A1C  11/23/2023   FOOT EXAM  02/19/2024   OPHTHALMOLOGY EXAM  04/08/2024   Diabetic kidney evaluation - eGFR measurement  05/16/2024   MAMMOGRAM  07/17/2024   DEXA SCAN  07/25/2025   Colonoscopy  09/17/2027   Pneumonia Vaccine 82+ Years old  Completed   INFLUENZA VACCINE  Completed   Hepatitis C Screening  Completed   Zoster Vaccines- Shingrix  Completed   HPV VACCINES  Aged Out   DTaP/Tdap/Td  Discontinued   Immunization History  Administered Date(s) Administered  Fluad Quad(high Dose 65+) 10/11/2021   Fluad Trivalent(High Dose 65+) 09/03/2023   Influenza, High Dose Seasonal PF 12/18/2018   Influenza-Unspecified 11/20/2019, 09/18/2020   PFIZER(Purple Top)SARS-COV-2 Vaccination 01/23/2020, 02/17/2020, 09/18/2020, 07/16/2021   Pfizer Covid-19 Vaccine Bivalent Booster 80yrs & up 11/04/2021   Pneumococcal Conjugate-13 09/09/2016   Pneumococcal Polysaccharide-23 10/27/2018   Zoster Recombinant(Shingrix) 08/24/2019, 11/20/2019   We updated and reviewed the patient's past history in detail and it is documented below. Allergies: Patient is allergic to ace inhibitors, altace [ramipril], and lisinopril. Past Medical History Patient  has a past medical history of Allergy, Arthritis, Chronic kidney disease, Diabetes mellitus without complication (HCC), GERD (gastroesophageal reflux disease), Hyperlipemia, and Hypertension. Past Surgical  History Patient  has a past surgical history that includes Medial partial knee replacement; Colonoscopy; Trigger finger release; Appendectomy; Coronary/Graft Acute MI Revascularization (N/A, 02/02/2023); and LEFT HEART CATH AND CORONARY ANGIOGRAPHY (N/A, 02/02/2023). Family History: Patient family history includes Arthritis in her mother; COPD in her father and mother; Heart attack in her brother, father, and mother. Social History:  Patient  reports that she has never smoked. She has never used smokeless tobacco. She reports that she does not drink alcohol and does not use drugs.  Review of Systems: Constitutional: negative for fever or malaise Ophthalmic: negative for photophobia, double vision or loss of vision Cardiovascular: negative for chest pain, dyspnea on exertion, or new LE swelling Respiratory: negative for SOB or persistent cough Gastrointestinal: negative for abdominal pain, change in bowel habits or melena Genitourinary: negative for dysuria or gross hematuria, no abnormal uterine bleeding or disharge Musculoskeletal: negative for new gait disturbance or muscular weakness Integumentary: negative for new or persistent rashes, no breast lumps Neurological: negative for TIA or stroke symptoms Psychiatric: negative for SI or delusions Allergic/Immunologic: negative for hives  Patient Care Team    Relationship Specialty Notifications Start End  Willow Ora, MD PCP - General Family Medicine  08/01/21   Runell Gess, MD PCP - Cardiology Cardiology  02/02/23     Objective  Vitals: BP 124/64   Pulse 80   Temp 97.8 F (36.6 C)   Ht 5' 1.5" (1.562 m)   Wt 124 lb 12.8 oz (56.6 kg)   LMP  (LMP Unknown)   SpO2 98%   BMI 23.20 kg/m  General:  Well developed, well nourished, no acute distress  Psych:  Alert and orientedx3,normal mood and affect HEENT:  Normocephalic, atraumatic, non-icteric sclera,  supple neck without adenopathy, mass or thyromegaly Cardiovascular:   Normal S1, S2, RRR without gallop, rub or murmur Respiratory:  Good breath sounds bilaterally, CTAB with normal respiratory effort Gastrointestinal: normal bowel sounds, soft, non-tender, no noted masses. No HSM MSK: extremities without edema, joints without erythema or swelling Neurologic:    Mental status is normal.  Gross motor and sensory exams are normal.  No tremor  Commons side effects, risks, benefits, and alternatives for medications and treatment plan prescribed today were discussed, and the patient expressed understanding of the given instructions. Patient is instructed to call or message via MyChart if he/she has any questions or concerns regarding our treatment plan. No barriers to understanding were identified. We discussed Red Flag symptoms and signs in detail. Patient expressed understanding regarding what to do in case of urgent or emergency type symptoms.  Medication list was reconciled, printed and provided to the patient in AVS. Patient instructions and summary information was reviewed with the patient as documented in the AVS. This note was prepared with assistance of  Dragon Chemical engineer. Occasional wrong-word or sound-a-like substitutions may have occurred due to the inherent limitations of voice recognition software

## 2023-09-05 ENCOUNTER — Encounter: Payer: Self-pay | Admitting: Family Medicine

## 2023-09-05 NOTE — Progress Notes (Signed)
Dr. Allyson Sabal and Vara Guardian work with you.  Her kidney function has remained elevated on the [place].  Not sure if you would like to change this back to losartan or continue it.  Please let the patient know.  Will need to consider renal consult. On improving her diabetic control as well.  May need to restart insulin. Thanks!  Dr. Jiles Prows, please call patient and let her know that I would like her to consider restarting nighttime insulin.  If she agrees, order Lantus 10 units nightly (solostar pen).  She can have a follow-up visit with me if she would like to discuss further.  Also sent her a MyChart message.  You can let her know those things as well.  Thanks

## 2023-09-09 ENCOUNTER — Telehealth: Payer: Self-pay

## 2023-09-09 ENCOUNTER — Other Ambulatory Visit: Payer: Self-pay | Admitting: Family Medicine

## 2023-09-09 ENCOUNTER — Other Ambulatory Visit: Payer: Self-pay

## 2023-09-09 DIAGNOSIS — E785 Hyperlipidemia, unspecified: Secondary | ICD-10-CM

## 2023-09-09 DIAGNOSIS — Z79899 Other long term (current) drug therapy: Secondary | ICD-10-CM

## 2023-09-09 DIAGNOSIS — I10 Essential (primary) hypertension: Secondary | ICD-10-CM

## 2023-09-09 MED ORDER — LOSARTAN POTASSIUM 25 MG PO TABS: 25.0000 mg | ORAL_TABLET | Freq: Every day | ORAL | 5 refills | Status: DC

## 2023-09-09 NOTE — Telephone Encounter (Signed)
Spoke with pt. Pt is going to d/c Entresto at this time and start Losartan 25 mg daily. Pt will repeat BMET in 2-4 weeks as directed. Lab order mailed to pt.

## 2023-09-12 ENCOUNTER — Telehealth: Payer: Self-pay | Admitting: Cardiovascular Disease

## 2023-09-12 NOTE — Telephone Encounter (Signed)
Spoke with patient she is aware to refer all vaccines to PCP

## 2023-09-12 NOTE — Telephone Encounter (Signed)
New Message:       Patient called.med and wanted to know if she can take the Covid and RSV shot?     Pt c/o medication issue:  1. Name of Medication: Covid and RSV Shot  2. How are you currently taking this medication (dosage and times per day)?   3. Are you having a reaction (difficulty breathing--STAT)?   4. What is your medication issue? Can she take these shots

## 2023-09-12 NOTE — Telephone Encounter (Signed)
Spoke with patient and she would like to know if you agree with her getting [place] and RSV vaccine that was recommended by PCP.

## 2023-09-20 ENCOUNTER — Other Ambulatory Visit: Payer: Self-pay | Admitting: Family Medicine

## 2023-09-20 ENCOUNTER — Telehealth: Payer: Self-pay | Admitting: Family Medicine

## 2023-09-20 NOTE — Telephone Encounter (Signed)
Patient states Dr. Kathie Rhodes, cardiologist, wanted her to complete some labs and informed her she could get this done at PCP office. I asked pt what kind of labs and she stated that cardiology informed her that information is in her chart. Please advise.

## 2023-09-24 ENCOUNTER — Other Ambulatory Visit: Payer: Self-pay

## 2023-09-24 DIAGNOSIS — E1159 Type 2 diabetes mellitus with other circulatory complications: Secondary | ICD-10-CM

## 2023-09-24 NOTE — Telephone Encounter (Signed)
LVM informing pt to call back to get scheduled

## 2023-09-25 ENCOUNTER — Other Ambulatory Visit (INDEPENDENT_AMBULATORY_CARE_PROVIDER_SITE_OTHER): Payer: BC Managed Care – PPO

## 2023-09-25 DIAGNOSIS — E1159 Type 2 diabetes mellitus with other circulatory complications: Secondary | ICD-10-CM

## 2023-09-25 DIAGNOSIS — I152 Hypertension secondary to endocrine disorders: Secondary | ICD-10-CM | POA: Diagnosis not present

## 2023-09-25 LAB — BASIC METABOLIC PANEL
BUN: 34 mg/dL — ABNORMAL HIGH (ref 6–23)
CO2: 23 meq/L (ref 19–32)
Calcium: 9.8 mg/dL (ref 8.4–10.5)
Chloride: 104 meq/L (ref 96–112)
Creatinine, Ser: 1.84 mg/dL — ABNORMAL HIGH (ref 0.40–1.20)
GFR: 26.9 mL/min — ABNORMAL LOW (ref >60.00)
Glucose, Bld: 101 mg/dL — ABNORMAL HIGH (ref 70–99)
Potassium: 4 meq/L (ref 3.5–5.1)
Sodium: 136 meq/L (ref 135–145)

## 2023-09-25 NOTE — Progress Notes (Signed)
See requested labs. Renal function persistently elevated.  Consider stopping ace, and if can't adjust meds to help, will need to refer to renal.  Thanks!

## 2023-09-26 ENCOUNTER — Other Ambulatory Visit: Payer: Self-pay

## 2023-09-26 DIAGNOSIS — N189 Chronic kidney disease, unspecified: Secondary | ICD-10-CM

## 2023-10-03 ENCOUNTER — Ambulatory Visit: Payer: BC Managed Care – PPO | Attending: Adult Health | Admitting: Adult Health

## 2023-10-03 ENCOUNTER — Telehealth: Payer: Self-pay | Admitting: Cardiovascular Disease

## 2023-10-03 ENCOUNTER — Encounter: Payer: Self-pay | Admitting: Adult Health

## 2023-10-03 VITALS — BP 142/84 | HR 78 | Ht 61.5 in | Wt 124.0 lb

## 2023-10-03 DIAGNOSIS — R002 Palpitations: Secondary | ICD-10-CM

## 2023-10-03 DIAGNOSIS — I10 Essential (primary) hypertension: Secondary | ICD-10-CM | POA: Diagnosis not present

## 2023-10-03 DIAGNOSIS — I5032 Chronic diastolic (congestive) heart failure: Secondary | ICD-10-CM | POA: Diagnosis not present

## 2023-10-03 DIAGNOSIS — I2510 Atherosclerotic heart disease of native coronary artery without angina pectoris: Secondary | ICD-10-CM

## 2023-10-03 DIAGNOSIS — R0602 Shortness of breath: Secondary | ICD-10-CM | POA: Diagnosis not present

## 2023-10-03 MED ORDER — LOSARTAN POTASSIUM 50 MG PO TABS: 50.0000 mg | ORAL_TABLET | Freq: Every day | ORAL | 3 refills | Status: DC

## 2023-10-03 NOTE — Progress Notes (Signed)
Cardiology Office Note:  .   Date:  10/03/2023  ID:  Wanda Medina, DOB [birth year], MRN [id number] PCP: Willow Ora, MD  [facility] Providers Cardiologist:  Dr. Tresa Endo }   History of Present Illness: .   Wanda Medina is a 72 y.o. female CAD s/p STEMI, DES-LCx in 01/2023, ICM, hypertension, hyperlipidemia, type 2 diabetes, and GERD on GDMT with Clifton Custard, continues on Brilinta and metoprolol.  [name] was recently discontinued on 09/09/2023 due to elevated creatinine of 1.8.  She was restarted on losartan 25 mg daily.  Over the last 2 days the patient has had worsening shortness of breath, fatigue, she also states that she has noticed her heart racing with minimal activity.  She was pouring rice into a pot of water, and noticed it.  It was short lasting but she did feel uncomfortable with it.  She is normally very active working in her garden 2 to 3 hours a day, she states that she walks 5 miles a day has never had any issues with chest pain or shortness of breath until the last couple of days.  She states that when she was feeling this by the end of the day she was tired so she took Tylenol and Benadryl and slept.  This morning she is felt some better.  She denies any dietary indiscretion with sodium intake.  She has not missed any doses of her medications.   ROS: As above otherwise negative.  Studies Reviewed: Marland Kitchen    EKG Interpretation Date/Time:  Thursday October 03 2023 14:44:24 EDT Ventricular Rate:  78 PR Interval:  184 QRS Duration:  82 QT Interval:  402 QTC Calculation: 458 R Axis:   -27  Text Interpretation: Normal sinus rhythm Possible Left atrial enlargement Inferior infarct (cited on or before 02-Feb-2023) Anterolateral infarct (cited on or before 02-Feb-2023) When compared with ECG of 05-Feb-2023 05:16, No significant change was found Confirmed by Joni Reining [phone]) on 10/03/2023 3:00:27 PM   Physical Exam:    VS:  BP (!)  142/84 (BP Location: Left Arm, Patient Position: Sitting, Cuff Size: Normal)   Pulse 78   Ht 5' 1.5" (1.562 m)   Wt 124 lb (56.2 kg)   LMP  (LMP Unknown)   SpO2 98%   BMI 23.05 kg/m    Wt Readings from Last 3 Encounters:  10/03/23 124 lb (56.2 kg)  09/03/23 124 lb 12.8 oz (56.6 kg)  05/24/23 121 lb 3.2 oz (55 kg)    GEN: Well nourished, well developed in no acute distress NECK: No JVD; No carotid bruits CARDIAC: RRR, no murmurs, rubs, gallops RESPIRATORY:  Clear to auscultation without rales, mild bibasilar crackles, no wheezes or rhonchi. ABDOMEN: Soft, non-tender, non-distended EXTREMITIES:  No edema; No deformity   ASSESSMENT AND PLAN: .    Dyspnea: Noticed more over the last couple of days with fatigue.  She has had associated heart racing with minimal activity.  This was confirmed by Apple watch heart rate recordings, but there was no EKG associated.  Heart rate went up to 113 bpm.  She is normally very active and has felt more tired over the last couple of days.  I will check a BNP and a CBC as she is also on Brilinta.  She has no evidence of volume overload on examination.  Weight has been stable. May consider Zio monitor to r/o Afib if she has recurrence of heart racing. .   2.  Coronary artery disease: History of  STEMI February 2024 with drug-eluting stent to proximal to mid circumflex lesion in the setting of 99% stenosis with 0% residual stenosis following procedure.  Symptoms of dyspnea and rapid heart rhythm are dissimilar to her symptoms prior to MI which was severe GERD.  No ischemic testing is planned at this time.  EKG is unchanged.  Continue continue Brilinta, and metoprolol.  3.  Chronic diastolic CHF: Was not able to tolerate Entresto due to creatinine elevation to 1.8.  This was discontinued appropriately and she was started on losartan 25 mg daily.  There is no evidence of volume overload on her examination today.  She is currently not on diuretic.  Checking BNP  today.  4.  Hypertension: Blood pressure is elevated in the office today.  I will increase her losartan from 25 mg daily to 50 mg daily.  She is to keep track of her blood pressure at home and report any new symptoms with higher dose.  She will follow-up in 2 to 3 weeks with her blood pressure recordings for comparison.  5.  History of iron deficiency anemia: Checking CBC today due to dyspnea and also being on antiplatelet therapy.     Signed, Bettey Mare. Liborio Nixon, ANP, AACC

## 2023-10-03 NOTE — Telephone Encounter (Signed)
Patient still currently SOB and not relieved with rest. New onset as of yesterday evening 10/23.  Pt reported having no energy to complete simple task. Tried to lift 10lb bag of rice and was unable to do so. Last HR was 101 but no new BP reading to provide.  No other symptoms present.  She verbalized understanding of appt time/location for today and to go to call us back if she has worsening or new symptoms before appt time.

## 2023-10-03 NOTE — Telephone Encounter (Signed)
Pt c/o Shortness Of Breath: STAT if SOB developed within the last 24 hours or pt is noticeably SOB on the phone  1. Are you currently SOB (can you hear that pt is SOB on the phone)? Today I felt shortness of breathing.   2. How long have you been experiencing SOB? Since yesterday I have this feeling.   3. Are you SOB when sitting or when up moving around? I did check BP it's little high it was 131/88 at 11-30 am.   4. Are you currently experiencing any other symptoms? No energy.    Answers received via patient schedule.

## 2023-10-03 NOTE — Patient Instructions (Signed)
Medication Instructions:  Increase Losartan to 50 mg ( Take 1 Tablet Daily). *If you need a refill on your cardiac medications before your next appointment, please call your pharmacy*   Lab Work: CBC, CMET, BNP If you have labs (blood work) drawn today and your tests are completely normal, you will receive your results only by: MyChart Message (if you have MyChart) OR A paper copy in the mail If you have any lab test that is abnormal or we need to change your treatment, we will call you to review the results.   Testing/Procedures: No Testing   Follow-Up: At [facility], you and your health needs are our priority.  As part of our continuing mission to provide you with exceptional heart care, we have created designated Provider Care Teams.  These Care Teams include your primary Cardiologist (physician) and Advanced Practice Providers (APPs -  Physician Assistants and Nurse Practitioners) who all work together to provide you with the care you need, when you need it.  We recommend signing up for the patient portal called "MyChart".  Sign up information is provided on this After Visit Summary.  MyChart is used to connect with patients for Virtual Visits (Telemedicine).  Patients are able to view lab/test results, encounter notes, upcoming appointments, etc.  Non-urgent messages can be sent to your provider as well.   To learn more about what you can do with MyChart, go to [email or web address].    Your next appointment:   2-3 week(s)  Provider:   Bernadene Person, NP

## 2023-10-04 LAB — CBC
Hematocrit: 39.3 % (ref 34.0–46.6)
Hemoglobin: 12.7 g/dL (ref 11.1–15.9)
MCH: 31.3 pg (ref 26.6–33.0)
MCHC: 32.3 g/dL (ref 31.5–35.7)
MCV: 97 fL (ref 79–97)
Platelets: 211 10*3/uL (ref 150–450)
RBC: 4.06 x10E6/uL (ref 3.77–5.28)
RDW: 12.4 % (ref 11.7–15.4)
WBC: 5.7 10*3/uL (ref 3.4–10.8)

## 2023-10-04 LAB — COMPREHENSIVE METABOLIC PANEL
ALT: 44 [IU]/L — ABNORMAL HIGH (ref 0–32)
AST: 48 [IU]/L — ABNORMAL HIGH (ref 0–40)
Albumin: 4.4 g/dL (ref 3.8–4.8)
Alkaline Phosphatase: 69 [IU]/L (ref 44–121)
BUN/Creatinine Ratio: 17 (ref 12–28)
BUN: 31 mg/dL — ABNORMAL HIGH (ref 8–27)
Bilirubin Total: 0.2 mg/dL (ref 0.0–1.2)
CO2: 20 mmol/L (ref 20–29)
Calcium: 9.5 mg/dL (ref 8.7–10.3)
Chloride: 102 mmol/L (ref 96–106)
Creatinine, Ser: 1.85 mg/dL — ABNORMAL HIGH (ref 0.57–1.00)
Globulin, Total: 2.9 g/dL (ref 1.5–4.5)
Glucose: 99 mg/dL (ref 70–99)
Potassium: 4.9 mmol/L (ref 3.5–5.2)
Sodium: 139 mmol/L (ref 134–144)
Total Protein: 7.3 g/dL (ref 6.0–8.5)
eGFR: 28 mL/min/{1.73_m2} — ABNORMAL LOW (ref >59)

## 2023-10-04 LAB — BRAIN NATRIURETIC PEPTIDE: BNP: 75.6 pg/mL (ref 0.0–100.0)

## 2023-10-10 ENCOUNTER — Telehealth: Payer: Self-pay

## 2023-10-10 NOTE — Telephone Encounter (Addendum)
Results viewed by patient via MyChart.----- Message from Joni Reining sent at 10/04/2023  5:09 PM EDT ----- BNP and CBC are normal. No other changes. She will see Bernadene Person on follow up.

## 2023-10-16 ENCOUNTER — Telehealth: Payer: Self-pay

## 2023-10-16 DIAGNOSIS — E785 Hyperlipidemia, unspecified: Secondary | ICD-10-CM

## 2023-10-16 MED ORDER — ROSUVASTATIN CALCIUM 20 MG PO TABS: 20.0000 mg | ORAL_TABLET | Freq: Every day | ORAL | 3 refills | Status: DC

## 2023-10-16 NOTE — Telephone Encounter (Addendum)
Called patient regarding results. Patient advised to decrease Crestor from 40 mg to 20 mg Daily. Patient advised to have CMET in 1 week. Patietn had understanding of instructions .----- Message from Joni Reining sent at 10/04/2023  7:16 AM EDT ----- Decrease rosuvastatin to 20 mg daily from 40 mg daily.  Liver enzymes are elevated, Kidney function is still slightly elevated. Will need to repeat the CMET in 2 weeks. No evidence of anemia Continue to keep track of the BP and record this,  call for worsening symptoms.   KL

## 2023-10-21 ENCOUNTER — Encounter: Payer: Self-pay | Admitting: Nurse Practitioner

## 2023-10-21 ENCOUNTER — Ambulatory Visit: Payer: BC Managed Care – PPO | Attending: Nurse Practitioner | Admitting: Nurse Practitioner

## 2023-10-21 VITALS — BP 134/82 | HR 81 | Ht 61.5 in | Wt 123.0 lb

## 2023-10-21 DIAGNOSIS — I255 Ischemic cardiomyopathy: Secondary | ICD-10-CM | POA: Diagnosis not present

## 2023-10-21 DIAGNOSIS — R0602 Shortness of breath: Secondary | ICD-10-CM

## 2023-10-21 DIAGNOSIS — I10 Essential (primary) hypertension: Secondary | ICD-10-CM | POA: Diagnosis not present

## 2023-10-21 DIAGNOSIS — R002 Palpitations: Secondary | ICD-10-CM

## 2023-10-21 DIAGNOSIS — I2510 Atherosclerotic heart disease of native coronary artery without angina pectoris: Secondary | ICD-10-CM

## 2023-10-21 DIAGNOSIS — E1122 Type 2 diabetes mellitus with diabetic chronic kidney disease: Secondary | ICD-10-CM

## 2023-10-21 DIAGNOSIS — E785 Hyperlipidemia, unspecified: Secondary | ICD-10-CM

## 2023-10-21 DIAGNOSIS — N1832 Chronic kidney disease, stage 3b: Secondary | ICD-10-CM

## 2023-10-21 MED ORDER — CARVEDILOL 3.125 MG PO TABS: 3.1250 mg | ORAL_TABLET | Freq: Two times a day (BID) | ORAL | 3 refills | Status: DC

## 2023-10-21 NOTE — Patient Instructions (Signed)
Medication Instructions:  Stop Metoprolol as directed. Start Carvedilol 3.125 mg twice daily  *If you need a refill on your cardiac medications before your next appointment, please call your pharmacy*   Lab Work: Complete previously ordered CMET today.   Testing/Procedures: NONE ordered at this time of appointment   Follow-Up: At [facility], you and your health needs are our priority.  As part of our continuing mission to provide you with exceptional heart care, we have created designated Provider Care Teams.  These Care Teams include your primary Cardiologist (physician) and Advanced Practice Providers (APPs -  Physician Assistants and Nurse Practitioners) who all work together to provide you with the care you need, when you need it.  We recommend signing up for the patient portal called "MyChart".  Sign up information is provided on this After Visit Summary.  MyChart is used to connect with patients for Virtual Visits (Telemedicine).  Patients are able to view lab/test results, encounter notes, upcoming appointments, etc.  Non-urgent messages can be sent to your provider as well.   To learn more about what you can do with MyChart, go to [email or web address].    Your next appointment:    Keep scheduled December apt   Provider:   Nanetta Batty, MD     Other Instructions Goal blood pressure is 140/80 or below

## 2023-10-21 NOTE — Progress Notes (Signed)
Office Visit    Patient Name: Wanda Medina Date of Encounter: 10/21/2023  Primary Care Provider:  Willow Ora, MD Primary Cardiologist:  Nanetta Batty, MD  Chief Complaint    72 year old female with a history of CAD s/p STEMI, DES-LCx in 01/2023, ICM, hypertension, hyperlipidemia, type 2 diabetes, and GERD who presents for follow-up related to CAD.   Past Medical History    Past Medical History:  Diagnosis Date   Allergy    Arthritis    Chronic kidney disease    AKI saw Nephrologist in [place]   Diabetes mellitus without complication (HCC)    GERD (gastroesophageal reflux disease)    Hyperlipemia    Hypertension    Past Surgical History:  Procedure Laterality Date   APPENDECTOMY     COLONOSCOPY     CORONARY/GRAFT ACUTE MI REVASCULARIZATION N/A 02/02/2023   Procedure: Coronary/Graft Acute MI Revascularization;  Surgeon: Runell Gess, MD;  Location: [facility];  Service: Cardiovascular;  Laterality: N/A;   LEFT HEART CATH AND CORONARY ANGIOGRAPHY N/A 02/02/2023   Procedure: LEFT HEART CATH AND CORONARY ANGIOGRAPHY;  Surgeon: Runell Gess, MD;  Location: [facility];  Service: Cardiovascular;  Laterality: N/A;   MEDIAL PARTIAL KNEE REPLACEMENT     Rt. knee in 05/2016   TRIGGER FINGER RELEASE     both hands    Allergies  Allergies  Allergen Reactions   Ace Inhibitors     cough   Altace [Ramipril] Cough   Lisinopril Cough     Labs/Other Studies Reviewed    The following studies were reviewed today:  Cardiac Studies & Procedures   CARDIAC CATHETERIZATION  CARDIAC CATHETERIZATION 02/02/2023  Narrative Images from the original result were not included.    Prox Cx to Mid Cx lesion is 99% stenosed.   Mid LAD lesion is 60% stenosed.   A drug-eluting stent was successfully placed using a SYNERGY XD 3.0X16.   Post intervention, there is a 0% residual stenosis.   The left ventricular systolic function is normal.   LV  end diastolic pressure is mildly elevated.   The left ventricular ejection fraction is 50-55% by visual estimate.  Wanda Medina is a 72 y.o. female   [id number] LOCATION:  FACILITY: [facility] PHYSICIAN: Nanetta Batty, M.D. [birth year]   DATE OF PROCEDURE:  02/02/2023  DATE OF DISCHARGE:     CARDIAC CATHETERIZATION    History obtained from chart review.  [name] is a 72 year old married [place] female with history of hypertension, hyperlipidemia, non-insulin-requiring diabetes and family history for heart disease.  She has been having off-and-on chest pain for several days which was persistent this evening.  EMS was called.  EKG showed inferolateral ST segment elevation.  She was hemodynamically stable.  She was transported urgently to [facility] for angiography and potential intervention.   PROCEDURE DESCRIPTION:  The patient was brought to the second floor [facility] cath lab in the postabsorptive state.  She was not premedicated .  Her right groinwas prepped and shaved in usual sterile fashion. Xylocaine 1% was used for local anesthesia. A 6 French sheath was inserted into the right common femoral artery using standard Seldinger technique.  Ultrasound was used to identify the right common femoral artery and guide access.  A digital image was captured and placed the patient's chart.  The patient received 6000 units  of heparin intravenously.  6 French right left Judkins diagnostic catheters were used for selective coronary angiography  and left ventriculography respectively.  Isovue dye is used for the entirety of the case (150 cc of contrast total to patient).  Retrograde aortic, ventricular and pullback pressures were recorded.  LVEDP was measured at 22 mmHg.  The patient received 180 mg of p.o. Brilinta.  The ending ACT was 347.  Isovue dye is used for the entirety of the intervention.  Retrograde ordered pressures monitored to the case.  Using a 6 [place] XB  3.0 cm guide catheter along with 0.14 [place] guidewire I crossed the proximal to mid dominant AV groove circumflex lesion with little difficulty.  Because of the thrombotic nature of the lesion I passed a 5.5 Pronto aspiration thrombectomy catheter and perform multiple passes.  I then predilated with a 2 mm x 12 mm balloon and stented with a 3 mm x 16 mm long Synergy drug-eluting stent deployed at 16 atm.  I postdilated the stent with a 3.25 mm x 12 mm long noncompliant balloon at 14 atm (3.33 cm) resulting in reduction of a 99% thrombotic AV groove circumflex lesion and a dominant vessel to 0% residual TIMI-3 flow.  The patient tolerated the procedure well.  I did give her Aggrastat bolus followed by a 6-hour infusion.  Impression Successful PCI drug-eluting stenting of proximal to mid AV groove circumflex and a dominant vessel with inferolateral ST segment elevation.  The door to balloon time was 25 minutes.  The LVEDP was measured at 22 mmHg.  The patient did have normal LV function.  She had 70% segmental calcified mid LAD stenosis after the second diagonal branch.  She will need 12 months of uninterrupted DAPT along with high-dose statin therapy, low-dose beta-blocker, SGLT2.  I anticipate early discharge within 48 hours (fast-track).  The sheath was sewn securely in place.  The patient left lab in stable condition.  Nanetta Batty. MD, [facility] 02/02/2023 5:40 AM  Findings Coronary Findings Diagnostic  Dominance: Left  Left Anterior Descending Mid LAD lesion is 60% stenosed. Vessel is not the culprit lesion.  Left Circumflex Prox Cx to Mid Cx lesion is 99% stenosed. Vessel is the culprit lesion. The lesion is heavily thrombotic.  Intervention  Prox Cx to Mid Cx lesion Stent Lesion crossed with guidewire. Pre-stent angioplasty was performed. A drug-eluting stent was successfully placed using a SYNERGY XD 3.0X16. Stent strut is well apposed. Stent does not overlap previously placed  stentPost-stent angioplasty was performed. Post-Intervention Lesion Assessment The intervention was successful. Pre-interventional TIMI flow is 2. Post-intervention TIMI flow is 3. No complications occurred at this lesion. There is a 0% residual stenosis post intervention.   STRESS TESTS  EXERCISE TOLERANCE TEST (ETT) 01/17/2021  Narrative  Blood pressure demonstrated a normal response to exercise.  There was no ST segment deviation noted during stress.  Negative, adequate stress test.   ECHOCARDIOGRAM  ECHOCARDIOGRAM LIMITED 06/26/2023  Narrative ECHOCARDIOGRAM LIMITED REPORT    Patient Name:   STEPHANIEANN GILLAND Date of Exam: 06/26/2023 Medical Rec #:  [id number]                    Height:       61.5 in Accession #:    [id number]                   Weight:       121.2 lb Date of Birth:  [birth year]                     BSA:  1.536 m Patient Age:    72 years                     BP:           130/77 mmHg Patient Gender: F                            HR:           86 bpm. Exam Location:  [address]  Procedure: Limited Echo, Cardiac Doppler, Limited Color Doppler, Strain Analysis and 3D Echo  Indications:    I25.5 Cardiomyopathy-Ischemic  History:        Patient has prior history of Echocardiogram examinations, most recent 02/02/2023. STEMI, CKD; Risk Factors:Diabetes, Hypertension and HLD.  Sonographer:    Clearence Ped RCS Referring Phys: [phone] Pearse Shiffler C Jasier Calabretta  IMPRESSIONS   1. Left ventricular ejection fraction, by estimation, is 60 to 65%. The left ventricle has normal function. There is mild concentric left ventricular hypertrophy. Left ventricular diastolic parameters are consistent with Grade I diastolic dysfunction (impaired relaxation). 2. Right ventricular systolic function is normal. The right ventricular size is normal. 3. The mitral valve is normal in structure. Mild mitral valve regurgitation. 4. The aortic valve is tricuspid. There is mild  calcification of the aortic valve. Aortic valve regurgitation is not visualized. Aortic valve sclerosis/calcification is present, without any evidence of aortic stenosis.  Conclusion(s)/Recommendation(s): The EF has normalized since previous echo.  FINDINGS Left Ventricle: Left ventricular ejection fraction, by estimation, is 60 to 65%. The left ventricle has normal function. There is mild concentric left ventricular hypertrophy. Left ventricular diastolic parameters are consistent with Grade I diastolic dysfunction (impaired relaxation).  Right Ventricle: The right ventricular size is normal. No increase in right ventricular wall thickness. Right ventricular systolic function is normal.  Left Atrium: Left atrial size was normal in size.  Right Atrium: Right atrial size was normal in size.  Pericardium: There is no evidence of pericardial effusion.  Mitral Valve: The mitral valve is normal in structure. Mild mitral valve regurgitation.  Tricuspid Valve: The tricuspid valve is normal in structure. Tricuspid valve regurgitation is trivial.  Aortic Valve: The aortic valve is tricuspid. There is mild calcification of the aortic valve. Aortic valve regurgitation is not visualized. Aortic valve sclerosis/calcification is present, without any evidence of aortic stenosis.  Pulmonic Valve: The pulmonic valve was grossly normal. Pulmonic valve regurgitation is not visualized.  Venous: The inferior vena cava was not well visualized.  LEFT VENTRICLE PLAX 2D LVIDd:         2.80 cm   Diastology LVIDs:         2.10 cm   LV e' medial:    4.13 cm/s LV PW:         1.30 cm   LV E/e' medial:  18.1 LV IVS:        1.30 cm   LV e' lateral:   7.40 cm/s LVOT diam:     1.70 cm   LV E/e' lateral: 10.1 LVOT Area:     2.27 cm  3D Volume EF: 3D EF:        51 % LV EDV:       79 ml LV ESV:       38 ml LV SV:        41 ml  LEFT ATRIUM         Index LA diam:  3.40 cm 2.21 cm/m  AORTA Ao Root diam:  3.10 cm Ao Asc diam:  3.10 cm  MITRAL VALVE MV Area (PHT):              SHUNTS MV Decel Time:              Systemic Diam: 1.70 cm MV E velocity: 74.90 cm/s MV A velocity: 115.00 cm/s MV E/A ratio:  0.65  Arvilla Meres MD Electronically signed by Arvilla Meres MD Signature Date/Time: 06/26/2023/11:25:16 AM    Final            Recent Labs: 09/03/2023: TSH 2.09 10/03/2023: ALT 44; BNP 75.6; BUN 31; Creatinine, Ser 1.85; Hemoglobin 12.7; Platelets 211; Potassium 4.9; Sodium 139  Recent Lipid Panel    Component Value Date/Time   CHOL 141 09/03/2023 1036   TRIG 73.0 09/03/2023 1036   HDL 78.40 09/03/2023 1036   CHOLHDL 2 09/03/2023 1036   VLDL 14.6 09/03/2023 1036   LDLCALC 48 09/03/2023 1036    History of Present Illness    72 year old female with the above past medical history including CAD s/p STEMI, DES-LCx in 01/2023, ICM, hypertension, hyperlipidemia, type 2 diabetes, and GERD.   She has a strong family history of CAD.  She presented to the ED on 02/02/2023 with chest pain.  EKG in the field showed inferolateral STEMI.  She underwent emergent cardiac catheterization which revealed 99% proximal to mid left circumflex stenosis, s/p DES, 60% mid LAD stenosis, EF 50 to 55% by visual estimate.  Echocardiogram showed EF 45 to 50%, mildly decreased LV function, mild LVH with basal septal segment, G1 DD, normal RV function, mild mitral valve regurgitation, aortic sclerosis without evidence of stenosis.  She was started on aspirin, Brilinta, metoprolol, losartan, Farxiga, and Crestor.  She was later started on Entresto, however this was discontinued in the setting of elevated creatinine.  She was transitioned to losartan.  Echocardiogram in 06/2023 showed EF 60 to 65%, normal LV function, mild concentric LVH, G1 DD, normal RV systolic function, mild mitral valve regurgitation.  She was last seen in the office on 10/03/2023 and noted worsening shortness of breath, elevated heart rate with  minimal activity.  BNP was normal.  BP was mildly elevated.  Losartan was increased to 50 mg daily.  Rosuvastatin was decreased to 20 mg daily in the setting of mildly elevated liver enzymes.    She presents today for follow-up accompanied by her daughters. Since her last visit she has been stable overall from a cardiac standpoint.  Her diastolic blood pressure has been mildly elevated.  She notes that this is often been in the setting of stress. She has days where she feels tired.  She continues to work in her yard but admits she has not been walking regularly as before.  She denies any chest pain, palpitations, dyspnea, edema, PND, orthopnea, weight gain.   Home Medications    Current Outpatient Medications  Medication Sig Dispense Refill   aspirin EC 81 MG tablet Take 1 tablet (81 mg total) by mouth daily. Swallow whole. 90 tablet 3   calcium-vitamin D (OSCAL WITH D) 500-200 MG-UNIT tablet Take 1 tablet by mouth daily.     carvedilol (COREG) 3.125 MG tablet Take 1 tablet (3.125 mg total) by mouth 2 (two) times daily with a meal. 180 tablet 3   cyclobenzaprine (FLEXERIL) 5 MG tablet TAKE 1 TABLET BY MOUTH 2 TIMES DAILY AS NEEDED FOR MUSCLE SPASMS. 180 tablet 3   dapagliflozin propanediol (  FARXIGA) 10 MG TABS tablet Take 1 tablet (10 mg total) by mouth daily before breakfast. 90 tablet 3   Dulaglutide (TRULICITY) 3 MG/0.5ML SOPN Inject 3 mg as directed once a week. 6 mL 3   ferrous sulfate 324 (65 Fe) MG TBEC Take 324 mg by mouth daily.     fluticasone (FLONASE) 50 MCG/ACT nasal spray Place 2 sprays into both nostrils daily. 16 g 6   gabapentin (NEURONTIN) 100 MG capsule TAKE 1 CAPSULE BY MOUTH TWICE A DAY 180 capsule 3   Lancets (BD LANCET ULTRAFINE 30G) MISC 1 application by Does not apply route in the morning and at bedtime. 360 each 3   losartan (COZAAR) 50 MG tablet Take 1 tablet (50 mg total) by mouth daily. 90 tablet 3   omeprazole (PRILOSEC) 20 MG capsule TAKE 1 CAPSULE BY MOUTH EVERY DAY  90 capsule 3   ONETOUCH ULTRA test strip TEST BLOOD SUGARS TWICE DAILY AS NEEDED. 100 strip 3   rosuvastatin (CRESTOR) 20 MG tablet Take 1 tablet (20 mg total) by mouth daily. 90 tablet 3   ticagrelor (BRILINTA) 90 MG TABS tablet Take 1 tablet (90 mg total) by mouth 2 (two) times daily. 180 tablet 3   zinc gluconate 50 MG tablet Take 50 mg by mouth daily.     montelukast (SINGULAIR) 10 MG tablet TAKE 1 TABLET BY MOUTH EVERYDAY AT BEDTIME (Patient not taking: Reported on 10/21/2023) 90 tablet 0   nitroGLYCERIN (NITROSTAT) 0.4 MG SL tablet Place 1 tablet (0.4 mg total) under the tongue every 5 (five) minutes x 3 doses as needed for chest pain. (Patient not taking: Reported on 10/21/2023) 25 tablet 1   No current facility-administered medications for this visit.     Review of Systems    She denies chest pain, palpitations, dyspnea, pnd, orthopnea, n, v, dizziness, syncope, edema, weight gain, or early satiety. All other systems reviewed and are otherwise negative except as noted above.   Physical Exam    VS:  BP 134/82 (BP Location: Right Arm, Patient Position: Sitting, Cuff Size: Small)   Pulse 81   Ht 5' 1.5" (1.562 m)   Wt 123 lb (55.8 kg)   LMP  (LMP Unknown)   SpO2 99%   BMI 22.86 kg/m   GEN: Well nourished, well developed, in no acute distress. HEENT: normal. Neck: Supple, no JVD, carotid bruits, or masses. Cardiac: RRR, no murmurs, rubs, or gallops. No clubbing, cyanosis, edema.  Radials/DP/PT 2+ and equal bilaterally.  Respiratory:  Respirations regular and unlabored, clear to auscultation bilaterally. GI: Soft, nontender, nondistended, BS + x 4. MS: no deformity or atrophy. Skin: warm and dry, no rash. Neuro:  Strength and sensation are intact. Psych: Normal affect.  Accessory Clinical Findings    ECG personally reviewed by me today -    - no EKG in office today.   Lab Results  Component Value Date   WBC 5.7 10/03/2023   HGB 12.7 10/03/2023   HCT 39.3 10/03/2023    MCV 97 10/03/2023   PLT 211 10/03/2023   Lab Results  Component Value Date   CREATININE 1.85 (H) 10/03/2023   BUN 31 (H) 10/03/2023   NA 139 10/03/2023   K 4.9 10/03/2023   CL 102 10/03/2023   CO2 20 10/03/2023   Lab Results  Component Value Date   ALT 44 (H) 10/03/2023   AST 48 (H) 10/03/2023   ALKPHOS 69 10/03/2023   BILITOT 0.2 10/03/2023   Lab Results  Component  Value Date   CHOL 141 09/03/2023   HDL 78.40 09/03/2023   LDLCALC 48 09/03/2023   TRIG 73.0 09/03/2023   CHOLHDL 2 09/03/2023    Lab Results  Component Value Date   HGBA1C 7.8 (H) 09/03/2023    Assessment & Plan    1. Palpitations/shortness of breath: Most recent echo in 06/2023 showed EF 60 to 65%, normal LV function, mild concentric LVH, G1 DD, normal RV systolic function, mild mitral valve regurgitation.  BNP at the time of symptoms was normal.  She does admit to increased personal stress, question if this contributed to her symptoms.  She denies any recent palpitations or shortness of breath, she does notice a dry cough.  Euvolemic and well compensated on exam.  Continue to monitor symptoms.  Encouraged increase activity as tolerated.  2. CAD: S/p STEMI, DES-LCx in 01/2023. Stable with no anginal symptoms.  Continue aspirin, Brilinta, carvedilol as below, losartan, Farxiga, and Crestor.   3. ICM: Echo in 01/2023 showed EF 45 to 50%, mildly decreased LV function, mild LVH with basal septal segment, G1 DD, normal RV function, mild mitral valve regurgitation, aortic sclerosis without evidence of stenosis.  Repeat echo in 06/2023 normalization of EF (60 to 65%). Euvolemic and well compensated on exam. Continue current medications as above.   4. Hypertension: Her BP, particularly her DBP has been elevated recently.  Will stop metoprolol and start carvedilol 3.125 mg twice daily.  For now, will continue losartan at current dose.  If BP remains elevated, consider titration of carvedilol versus transitioning to alternative  ARB.    5. Hyperlipidemia: LDL was 43 in 01/2023.  Recent mild elevation in liver enzymes, Crestor was decreased to 20 mg daily.  Will update CMET today.  Consider repeat fasting lipids, LFTs in 3 months.  If LDL is elevated above goal, and liver enzymes stable, consider resuming Crestor 40 mg daily.  For now, continue Crestor at current dose.  6. Type 2 diabetes: A1c was 7.8 in 08/2023.  Monitored and managed per PCP.   7. CKD stage IIIb: Creatinine was 1.85 in 09/2023.  CMET pending today.  If renal function remains decreased, consider referral to nephrology.   8. Disposition:  Follow-up as scheduled with Dr. Allyson Sabal in 11/2023.        Joylene Grapes, NP 10/21/2023, 7:17 PM

## 2023-10-22 ENCOUNTER — Telehealth: Payer: Self-pay

## 2023-10-22 DIAGNOSIS — N1832 Chronic kidney disease, stage 3b: Secondary | ICD-10-CM

## 2023-10-22 LAB — COMPREHENSIVE METABOLIC PANEL
ALT: 37 [IU]/L — ABNORMAL HIGH (ref 0–32)
AST: 39 [IU]/L (ref 0–40)
Albumin: 4.6 g/dL (ref 3.8–4.8)
Alkaline Phosphatase: 71 [IU]/L (ref 44–121)
BUN/Creatinine Ratio: 14 (ref 12–28)
BUN: 23 mg/dL (ref 8–27)
Bilirubin Total: 0.3 mg/dL (ref 0.0–1.2)
CO2: 21 mmol/L (ref 20–29)
Calcium: 9.8 mg/dL (ref 8.7–10.3)
Chloride: 104 mmol/L (ref 96–106)
Creatinine, Ser: 1.69 mg/dL — ABNORMAL HIGH (ref 0.57–1.00)
Globulin, Total: 3 g/dL (ref 1.5–4.5)
Glucose: 95 mg/dL (ref 70–99)
Potassium: 4.5 mmol/L (ref 3.5–5.2)
Sodium: 140 mmol/L (ref 134–144)
Total Protein: 7.6 g/dL (ref 6.0–8.5)
eGFR: 32 mL/min/{1.73_m2} — ABNORMAL LOW (ref >59)

## 2023-10-22 NOTE — Telephone Encounter (Signed)
Spoke with pt and pts daughter Minal. They were notified of lab results and referral to nephrology was discussed for ongoing management from chronic kidney disease. Pt is agreeable to see nephrology. Referral placed.

## 2023-10-23 ENCOUNTER — Telehealth: Payer: Self-pay

## 2023-10-23 NOTE — Telephone Encounter (Addendum)
Patients daughter called Minal regarding results by Lamar Benes, CMA . Patients daughter had understanding of results.----- Message from Joni Reining sent at 10/22/2023  7:01 AM EST ----- I reviewed the labs.  Kidney function has improved from 2 weeks ago, liver enzymes have also improved from 2 weeks ago.  Continue current medication management.

## 2023-11-02 ENCOUNTER — Other Ambulatory Visit: Payer: Self-pay | Admitting: Cardiovascular Disease

## 2023-11-18 ENCOUNTER — Ambulatory Visit: Payer: BC Managed Care – PPO | Attending: Cardiovascular Disease | Admitting: Cardiovascular Disease

## 2023-11-18 ENCOUNTER — Encounter: Payer: Self-pay | Admitting: Cardiovascular Disease

## 2023-11-18 VITALS — BP 130/88 | HR 84 | Ht 61.5 in | Wt 126.4 lb

## 2023-11-18 DIAGNOSIS — I152 Hypertension secondary to endocrine disorders: Secondary | ICD-10-CM

## 2023-11-18 DIAGNOSIS — I2119 ST elevation (STEMI) myocardial infarction involving other coronary artery of inferior wall: Secondary | ICD-10-CM | POA: Diagnosis not present

## 2023-11-18 DIAGNOSIS — E785 Hyperlipidemia, unspecified: Secondary | ICD-10-CM

## 2023-11-18 DIAGNOSIS — I255 Ischemic cardiomyopathy: Secondary | ICD-10-CM | POA: Diagnosis not present

## 2023-11-18 DIAGNOSIS — E1159 Type 2 diabetes mellitus with other circulatory complications: Secondary | ICD-10-CM

## 2023-11-18 NOTE — Assessment & Plan Note (Signed)
History of hyperlipidemia on rosuvastatin with lipid profile performed 08/30/2023 revealing a total cholesterol 141, LDL of 48 and HDL of 78.

## 2023-11-18 NOTE — Assessment & Plan Note (Signed)
History of essential hypertension blood pressure measured today 130/88.  She is on low-dose carvedilol and losartan.

## 2023-11-18 NOTE — Patient Instructions (Signed)
Medication Instructions:  Your physician recommends that you continue on your current medications as directed. Please refer to the Current Medication list given to you today.  *If you need a refill on your cardiac medications before your next appointment, please call your pharmacy*   Follow-Up: At [facility], you and your health needs are our priority.  As part of our continuing mission to provide you with exceptional heart care, we have created designated Provider Care Teams.  These Care Teams include your primary Cardiologist (physician) and Advanced Practice Providers (APPs -  Physician Assistants and Nurse Practitioners) who all work together to provide you with the care you need, when you need it.  We recommend signing up for the patient portal called "MyChart".  Sign up information is provided on this After Visit Summary.  MyChart is used to connect with patients for Virtual Visits (Telemedicine).  Patients are able to view lab/test results, encounter notes, upcoming appointments, etc.  Non-urgent messages can be sent to your provider as well.   To learn more about what you can do with MyChart, go to [email or web address].    Your next appointment:   6 month(s)  Provider:   Diona Browner, NP      Then, Quay Burow, MD will plan to see you again in 12 month(s).

## 2023-11-18 NOTE — Assessment & Plan Note (Signed)
History of ischemic cardiomyopathy with an EF initially by 2D echo post intervention of 45 to 50% on 02/02/2023.  Follow-up echo performed 06/26/2023 revealed normalization of her EF to 60 to 65% with grade 1 diastolic dysfunction.

## 2023-11-18 NOTE — Assessment & Plan Note (Signed)
History of inferolateral STEMI 01/29/2023.  I took her urgently to the Cath Lab and demonstrated a subtotal AV groove circumflex which I stented using a 3 mm x 16 mm long Synergy drug-eluting stent postdilated to 3.33 mm.  She in addition had 60% segmental mid LAD stenosis.  She remains on aspirin and Brilinta.

## 2023-11-18 NOTE — Progress Notes (Signed)
11/18/2023 Wanda Medina   Mar 04, 1951  [id number]  Primary Physician Willow Ora, MD Primary Cardiologist: Runell Gess MD Nicholes Calamity, [place]  HPI:  Wanda Medina is a 72 y.o. 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 at [employer].  She has a history of treated hypertension, hyperlipidemia and diabetes.  She does have a brother who has had stents as well.  She had an inferolateral STEMI 02/02/2023 which time I brought her to the Cath Lab revealing a subtotally occluded mid AV groove circumflex and a left dominant system and a 60% segmental mid LAD.  I performed PCI drug-eluting stenting using a 3 mm x 16 mm long Synergy drug-eluting stent postdilated to 3.33 mm.  She remains on DAPT with aspirin and Brilinta.  She has been asymptomatic.  Her 2D echo at the time of her intervention revealed an EF of 45 to 50%.  Follow-up echo performed 06/26/2023 revealed normalization of her ejection fraction.  She is currently fairly active and denies chest pain or shortness of breath.   Current Meds  Medication Sig   aspirin EC 81 MG tablet Take 1 tablet (81 mg total) by mouth daily. Swallow whole.   calcium-vitamin D (OSCAL WITH D) 500-200 MG-UNIT tablet Take 1 tablet by mouth daily.   carvedilol (COREG) 3.125 MG tablet Take 1 tablet (3.125 mg total) by mouth 2 (two) times daily with a meal.   cyclobenzaprine (FLEXERIL) 5 MG tablet TAKE 1 TABLET BY MOUTH 2 TIMES DAILY AS NEEDED FOR MUSCLE SPASMS.   dapagliflozin propanediol (FARXIGA) 10 MG TABS tablet Take 1 tablet (10 mg total) by mouth daily before breakfast.   Dulaglutide (TRULICITY) 3 MG/0.5ML SOPN Inject 3 mg as directed once a week.   ferrous sulfate 324 (65 Fe) MG TBEC Take 324 mg by mouth daily.   fluticasone (FLONASE) 50 MCG/ACT nasal spray Place 2 sprays into both  nostrils daily.   gabapentin (NEURONTIN) 100 MG capsule TAKE 1 CAPSULE BY MOUTH TWICE A DAY   losartan (COZAAR) 50 MG tablet Take 1 tablet (50 mg total) by mouth daily.   montelukast (SINGULAIR) 10 MG tablet TAKE 1 TABLET BY MOUTH EVERYDAY AT BEDTIME   nitroGLYCERIN (NITROSTAT) 0.4 MG SL tablet Place 1 tablet (0.4 mg total) under the tongue every 5 (five) minutes x 3 doses as needed for chest pain.   omeprazole (PRILOSEC) 20 MG capsule TAKE 1 CAPSULE BY MOUTH EVERY DAY   rosuvastatin (CRESTOR) 20 MG tablet Take 1 tablet (20 mg total) by mouth daily.   ticagrelor (BRILINTA) 90 MG TABS tablet Take 1 tablet (90 mg total) by mouth 2 (two) times daily.   zinc gluconate 50 MG tablet Take 50 mg by mouth daily.     Allergies  Allergen Reactions   Ace Inhibitors     cough   Altace [Ramipril] Cough   Lisinopril Cough    Social History   Socioeconomic History   Marital status: Married    Spouse name: Not on file   Number of children: 3   Years of education: Not on file   Highest education level: Not on file  Occupational History   Not on file  Tobacco Use   Smoking status: Never   Smokeless tobacco: Never  Vaping Use   Vaping status: Never Used  Substance and Sexual Activity   Alcohol  use: No   Drug use: No   Sexual activity: Not Currently  Other Topics Concern   Not on file  Social History Narrative   Not on file   Social Determinants of Health   Financial Resource Strain: Not on file  Food Insecurity: No Food Insecurity (02/04/2023)   Hunger Vital Sign    Worried About Running Out of Food in the Last Year: Never true    Ran Out of Food in the Last Year: Never true  Transportation Needs: No Transportation Needs (02/04/2023)   PRAPARE - Administrator, Civil Service (Medical): No    Lack of Transportation (Non-Medical): No  Physical Activity: Not on file  Stress: Not on file  Social Connections: Not on file  Intimate Partner Violence: Not At Risk (02/04/2023)    Humiliation, Afraid, Rape, and Kick questionnaire    Fear of Current or Ex-Partner: No    Emotionally Abused: No    Physically Abused: No    Sexually Abused: No     Review of Systems: General: negative for chills, fever, night sweats or weight changes.  Cardiovascular: negative for chest pain, dyspnea on exertion, edema, orthopnea, palpitations, paroxysmal nocturnal dyspnea or shortness of breath Dermatological: negative for rash Respiratory: negative for cough or wheezing Urologic: negative for hematuria Abdominal: negative for nausea, vomiting, diarrhea, bright red blood per rectum, melena, or hematemesis Neurologic: negative for visual changes, syncope, or dizziness All other systems reviewed and are otherwise negative except as noted above.    Blood pressure 130/88, pulse 84, height 5' 1.5" (1.562 m), weight 126 lb 6.4 oz (57.3 kg), SpO2 98%.  General appearance: alert and no distress Neck: no adenopathy, no carotid bruit, no JVD, supple, symmetrical, trachea midline, and thyroid not enlarged, symmetric, no tenderness/mass/nodules Lungs: clear to auscultation bilaterally Heart: regular rate and rhythm, S1, S2 normal, no murmur, click, rub or gallop Extremities: extremities normal, atraumatic, no cyanosis or edema Pulses: 2+ and symmetric Skin: Skin color, texture, turgor normal. No rashes or lesions Neurologic: Grossly normal  EKG not performed today      ASSESSMENT AND PLAN:   Hypertension associated with diabetes (HCC) History of essential hypertension blood pressure measured today 130/88.  She is on low-dose carvedilol and losartan.  Hyperlipidemia with target LDL less than 70 History of hyperlipidemia on rosuvastatin with lipid profile performed 08/30/2023 revealing a total cholesterol 141, LDL of 48 and HDL of 78.  Acute ST elevation myocardial infarction (STEMI) of inferior wall (HCC) History of inferolateral STEMI 01/29/2023.  I took her urgently to the Cath Lab and  demonstrated a subtotal AV groove circumflex which I stented using a 3 mm x 16 mm long Synergy drug-eluting stent postdilated to 3.33 mm.  She in addition had 60% segmental mid LAD stenosis.  She remains on aspirin and Brilinta.  Ischemic cardiomyopathy History of ischemic cardiomyopathy with an EF initially by 2D echo post intervention of 45 to 50% on 02/02/2023.  Follow-up echo performed 06/26/2023 revealed normalization of her EF to 60 to 65% with grade 1 diastolic dysfunction.     Runell Gess MD FACP,FACC,FAHA, [facility] 11/18/2023 3:51 PM

## 2023-12-03 NOTE — Telephone Encounter (Addendum)
Spoke with pt. Pt was made aware that [facility] tried to reach her to schedule apt per Bernadene Person NP request. Pt was given [facility] [phone] and will call them to schedule an apt.

## 2023-12-03 NOTE — Telephone Encounter (Signed)
Spoke with [facility] @ [phone]. They received the referral sent to them and contacted pt on 11/19/23. Pt hasn't returned their call to schedule appointment with a provider at [facility].

## 2023-12-03 NOTE — Telephone Encounter (Signed)
[facility] confirmed apt for pt was scheduled for 01/06/24 with Dr. Zetta Bills.

## 2024-01-06 ENCOUNTER — Other Ambulatory Visit: Payer: Self-pay | Admitting: Nephrology

## 2024-01-06 DIAGNOSIS — N1832 Chronic kidney disease, stage 3b: Secondary | ICD-10-CM

## 2024-01-07 LAB — PROTEIN / CREATININE RATIO, URINE: Creatinine, Urine: 20.1

## 2024-01-07 LAB — IRON,TIBC AND FERRITIN PANEL
%SAT: 32
Iron: 96
TIBC: 297
UIBC: 201

## 2024-01-07 LAB — VITAMIN D 25 HYDROXY (VIT D DEFICIENCY, FRACTURES): Vit D, 25-Hydroxy: 41.4

## 2024-01-07 LAB — COMPREHENSIVE METABOLIC PANEL: eGFR: 36

## 2024-01-07 LAB — BASIC METABOLIC PANEL: Creatinine: 1.5 — AB (ref 0.5–1.1)

## 2024-01-07 LAB — CBC AND DIFFERENTIAL: Hemoglobin: 12.9 (ref 12.0–16.0)

## 2024-01-09 ENCOUNTER — Ambulatory Visit
Admission: RE | Admit: 2024-01-09 | Discharge: 2024-01-09 | Disposition: A | Discharge status: Home / Self Care | Payer: BC Managed Care – PPO | Source: Ambulatory Visit | Attending: Nephrology | Admitting: Nephrology

## 2024-01-09 DIAGNOSIS — N1832 Chronic kidney disease, stage 3b: Secondary | ICD-10-CM

## 2024-01-09 LAB — LAB REPORT - SCANNED
Creatinine, POC: 20.1 mg/dL
EGFR: 36

## 2024-02-16 ENCOUNTER — Other Ambulatory Visit: Payer: Self-pay | Admitting: Nurse Practitioner

## 2024-02-18 ENCOUNTER — Other Ambulatory Visit: Payer: Self-pay | Admitting: Family Medicine

## 2024-02-19 ENCOUNTER — Other Ambulatory Visit: Payer: Self-pay | Admitting: Family Medicine

## 2024-02-19 ENCOUNTER — Other Ambulatory Visit: Payer: Self-pay

## 2024-02-19 MED ORDER — MONTELUKAST SODIUM 10 MG PO TABS: 10.0000 mg | ORAL_TABLET | Freq: Every day | ORAL | 3 refills | Status: DC

## 2024-02-19 MED ORDER — MONTELUKAST SODIUM 10 MG PO TABS: 10.0000 mg | ORAL_TABLET | Freq: Every day | ORAL | 3 refills | Status: AC

## 2024-02-19 NOTE — Telephone Encounter (Signed)
 Copied from CRM [id number]. Topic: Clinical - Medication Refill >> Feb 19, 2024 10:40 AM Truddie Crumble wrote: Most Recent Primary Care Visit:  Provider: [facility]  Department: [facility]  Visit Type: LAB  Date: 09/25/2023  Medication: montelukast (SINGULAIR) 10 MG tablet  Has the patient contacted their pharmacy? Yes (Agent: If no, request that the patient contact the pharmacy for the refill. If patient does not wish to contact the pharmacy document the reason why and proceed with request.) (Agent: If yes, when and what did the pharmacy advise?)  Is this the correct pharmacy for this prescription? Yes If no, delete pharmacy and type the correct one.  This is the patient's preferred pharmacy:  [id number] [place] [place] - [address] [place] [address] Phone: [phone] Fax: [phone]   Has the prescription been filled recently? No  Is the patient out of the medication? Yes  Has the patient been seen for an appointment in the last year OR does the patient have an upcoming appointment? Yes  Can we respond through MyChart? Yes  Agent: Please be advised that Rx refills may take up to 3 business days. We ask that you follow-up with your pharmacy.

## 2024-02-19 NOTE — Telephone Encounter (Signed)
 Prescription request was already signed today by PCP

## 2024-03-02 ENCOUNTER — Telehealth: Payer: Self-pay | Admitting: Cardiovascular Disease

## 2024-03-02 ENCOUNTER — Other Ambulatory Visit: Payer: Self-pay | Admitting: Nurse Practitioner

## 2024-03-02 MED ORDER — TICAGRELOR 90 MG PO TABS: 90.0000 mg | ORAL_TABLET | Freq: Two times a day (BID) | ORAL | 2 refills | Status: DC

## 2024-03-02 NOTE — Telephone Encounter (Signed)
 Pt's medication was sent to pt's pharmacy as requested. Confirmation received.

## 2024-03-02 NOTE — Telephone Encounter (Signed)
*  STAT* If patient is at the pharmacy, call can be transferred to refill team.   1. Which medications need to be refilled? (please list name of each medication and dose if known)   ticagrelor (BRILINTA) 90 MG TABS tablet   2. Which pharmacy/location (including street and city if local pharmacy) is medication to be sent to? [facility] [address] [place], [place] - [address] Phone: [phone]  Fax: [phone]     3. Do they need a 30 day or 90 day supply? 90 Pt is completely out of medication

## 2024-03-03 ENCOUNTER — Encounter: Payer: Self-pay | Admitting: Family Medicine

## 2024-03-03 ENCOUNTER — Ambulatory Visit: Payer: BC Managed Care – PPO | Admitting: Family Medicine

## 2024-03-03 ENCOUNTER — Other Ambulatory Visit: Payer: Self-pay | Admitting: Nurse Practitioner

## 2024-03-03 VITALS — BP 165/90 | HR 78 | Temp 97.7°F | Ht 61.5 in | Wt 129.2 lb

## 2024-03-03 DIAGNOSIS — N1832 Chronic kidney disease, stage 3b: Secondary | ICD-10-CM | POA: Diagnosis not present

## 2024-03-03 DIAGNOSIS — E1122 Type 2 diabetes mellitus with diabetic chronic kidney disease: Secondary | ICD-10-CM

## 2024-03-03 DIAGNOSIS — E11319 Type 2 diabetes mellitus with unspecified diabetic retinopathy without macular edema: Secondary | ICD-10-CM | POA: Diagnosis not present

## 2024-03-03 DIAGNOSIS — E1142 Type 2 diabetes mellitus with diabetic polyneuropathy: Secondary | ICD-10-CM

## 2024-03-03 DIAGNOSIS — I255 Ischemic cardiomyopathy: Secondary | ICD-10-CM

## 2024-03-03 DIAGNOSIS — R5383 Other fatigue: Secondary | ICD-10-CM | POA: Diagnosis not present

## 2024-03-03 DIAGNOSIS — N184 Chronic kidney disease, stage 4 (severe): Secondary | ICD-10-CM

## 2024-03-03 DIAGNOSIS — E1159 Type 2 diabetes mellitus with other circulatory complications: Secondary | ICD-10-CM

## 2024-03-03 DIAGNOSIS — I152 Hypertension secondary to endocrine disorders: Secondary | ICD-10-CM

## 2024-03-03 LAB — POCT GLYCOSYLATED HEMOGLOBIN (HGB A1C): Hemoglobin A1C: 7 % — AB (ref 4.0–5.6)

## 2024-03-03 LAB — TSH: TSH: 2.56 u[IU]/mL (ref 0.35–5.50)

## 2024-03-03 MED ORDER — LOSARTAN POTASSIUM 100 MG PO TABS: 100.0000 mg | ORAL_TABLET | Freq: Every day | ORAL | 3 refills | Status: AC

## 2024-03-03 NOTE — Patient Instructions (Addendum)
 Please return in 3 months to recheck diabetes and blood pressure    If you have any questions or concerns, please don't hesitate to send me a message via MyChart or call the office at [phone]. Thank you for visiting with [place] today! It's our pleasure caring for you.   VISIT SUMMARY:  Today, we reviewed your hypertension, diabetes, allergies, fatigue, and recent fall. We made some adjustments to your medications and discussed plans for follow-up and additional tests.  YOUR PLAN:  -HYPERTENSION: Hypertension means high blood pressure. Your blood pressure is slightly elevated, so we are increasing your medication dosage from 50 mg to 1000 mg. We will re-evaluate your blood pressure in 3 months.  -TYPE 2 DIABETES MELLITUS: Type 2 Diabetes Mellitus is a condition where your body does not use insulin properly, leading to high blood sugar levels. Your A1c has improved to 7, but it is still above the target. We will continue with your current diabetes management plan and re-evaluate in 3 months.  -FATIGUE: Fatigue means feeling very tired. This could be related to your thyroid function, so we will order a thyroid function test to investigate further.  -ALLERGIC RHINITIS: Allergic Rhinitis is an allergic reaction that causes sneezing, runny nose, and other similar symptoms. Despite taking Singulair, you still have symptoms. We will continue to monitor this.  -COCCYX CONTUSION: A coccyx contusion is a bruise on your tailbone. The pain from your fall is expected to resolve with time.  -GENERAL HEALTH MAINTENANCE: We encourage you to engage in regular physical activity, such as walking, to improve your overall health.  INSTRUCTIONS:  Please schedule a follow-up appointment in 3 months to re-evaluate your blood pressure and diabetes management. Additionally, we will order a thyroid function test to investigate your fatigue further.

## 2024-03-03 NOTE — Progress Notes (Signed)
 Subjective  CC:  Chief Complaint  Patient presents with   Diabetes   Hyperlipidemia   Hypertension    HPI: Wanda Medina is a 73 y.o. female who presents to the office today for follow up of diabetes and problems listed above in the chief complaint.  Discussed the use of AI scribe software for clinical note transcription with the patient, who gave verbal consent to proceed.  History of Present Illness   Wanda Medina is a 73 year old female with hypertension who presents for diabetes and HTN follow-up.  Her blood pressure has been stable on her current medication regimen, although she has not been checking it at home recently. Her last cardiologist visit was two to three months ago, and her blood pressure was stable at that time. She experiences shortness of breath with activity but has no chest pain or leg swelling. However, it is elevated in office today. Feels well but fatigued.   Her diabetes management has improved, with her A1c now at 7.0, which is better than previous levels. She recalls that when she was on insulin before, her blood sugar would drop around 4 PM. Currently, she is not on insulin and is monitoring her condition. She takes her farxiga and trulicity w/o problems  She has terrible allergies, for which she is taking allegra and Singulair. While the medication helps, she still experiences rhinorrhea and severe headaches. She walks her dog for about fifteen to twenty minutes but does not engage in much other outdoor activity due to fatigue.  She feels tired easily and attributes it to any activity she does, including cooking, which now takes her much longer than before. Her sleep pattern includes going to bed after midnight and waking up around six or seven in the morning, which she states is normal for her. No depression, and she describes her mood as happy, enjoying activities like watching TV. Not sure why she is tired. Endorsed sob with  activities. Her heart failure has improved with nl EF on last echo, w/ diastolic dysfunction only. No ongoing ischemia.  HLD w/ h/o elevated lfts; crestor dose was adjusted downward by cards and lfts improved.   In November, she fell in her vegetable garden and bruised her left side, including her coccyx, which still causes some pain. She is looking forward to gardening in the spring.      Wt Readings from Last 3 Encounters:  03/03/24 129 lb 3.2 oz (58.6 kg)  11/18/23 126 lb 6.4 oz (57.3 kg)  10/21/23 123 lb (55.8 kg)    BP Readings from Last 3 Encounters:  03/03/24 (!) 165/90  11/18/23 130/88  10/21/23 134/82    Assessment  1. Type 2 diabetes mellitus with stage 3b chronic kidney disease, without long-term current use of insulin (HCC)   2. Type 2 diabetes mellitus with stage 4 chronic kidney disease, without long-term current use of insulin (HCC)   3. 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 Diabetic peripheral neuropathy (HCC)   5. Hypertension associated with diabetes (HCC)   6. Other fatigue   7. Ischemic cardiomyopathy      Plan  Assessment and Plan    Hypertension Blood pressure elevated despite current medication adjustments. - Increase antihypertensive medication dosage losartan from 50mg  to 100mg  - Schedule follow-up in 3 months to re-evaluate blood pressure.  Type 2 Diabetes Mellitus A1c improved to 7 but remains above target.  Insulin not used due to hypoglycemia risk. - Continue current diabetes management plan. - Schedule follow-up in 3 months to re-evaluate diabetes management.  Fatigue Fatigue possibly related to thyroid function. - Order thyroid function test.  Allergic Rhinitis Persistent symptoms despite Singulair, continue allegra. And siingulair  Coccyx Contusion Pain continues post-fall, expected to resolve with time.  General Health  Maintenance Encouraged regular physical activity for overall health improvement. - Encourage regular walking and physical activity.        Follow up: 3 mo for recheck dm and htn Orders Placed This Encounter  Procedures   TSH   POCT HgB A1C   No orders of the defined types were placed in this encounter.     Immunization History  Administered Date(s) Administered   Fluad Quad(high Dose 65+) 10/11/2021   Fluad Trivalent(High Dose 65+) 09/03/2023   Influenza, High Dose Seasonal PF 12/18/2018   Influenza-Unspecified 11/20/2019, 09/18/2020   PFIZER(Purple Top)SARS-COV-2 Vaccination 01/23/2020, 02/17/2020, 09/18/2020, 07/16/2021   Pfizer Covid-19 Vaccine Bivalent Booster 69yrs & up 11/04/2021   Pfizer(Comirnaty)Fall Seasonal Vaccine 12 years and older 09/16/2023   Pneumococcal Conjugate-13 09/09/2016   Pneumococcal Polysaccharide-23 10/27/2018   Respiratory Syncytial Virus Vaccine,Recomb Aduvanted(Arexvy) 09/16/2023   Zoster Recombinant(Shingrix) 08/24/2019, 11/20/2019    Diabetes Related Lab Review: Lab Results  Component Value Date   HGBA1C 7.0 (A) 03/03/2024   HGBA1C 7.8 (H) 09/03/2023   HGBA1C 7.5 (A) 05/24/2023    Lab Results  Component Value Date   MICROALBUR 9.8 (H) 09/03/2023   Lab Results  Component Value Date   CREATININE 1.69 (H) 10/21/2023   BUN 23 10/21/2023   NA 140 10/21/2023   K 4.5 10/21/2023   CL 104 10/21/2023   CO2 21 10/21/2023   Lab Results  Component Value Date   CHOL 141 09/03/2023   CHOL 111 02/05/2023   CHOL 161 08/22/2022   Lab Results  Component Value Date   HDL 78.40 09/03/2023   HDL 52 02/05/2023   HDL 77.90 08/22/2022   Lab Results  Component Value Date   LDLCALC 48 09/03/2023   LDLCALC 43 02/05/2023   LDLCALC 70 08/22/2022   Lab Results  Component Value Date   TRIG 73.0 09/03/2023   TRIG 78 02/05/2023   TRIG 69.0 08/22/2022   Lab Results  Component Value Date   CHOLHDL 2 09/03/2023   CHOLHDL 2.1 02/05/2023    CHOLHDL 2 08/22/2022   No results found for: "LDLDIRECT" The ASCVD Risk score (Arnett DK, et al., 2019) failed to calculate for the following reasons:   Risk score cannot be calculated because patient has a medical history suggesting prior/existing ASCVD I have reviewed the PMH, Fam and Soc history. Patient Active Problem List   Diagnosis Date Noted Date Diagnosed   History of ST elevation myocardial infarction (STEMI) 09/03/2023     Priority: High    february 2024    Ischemic cardiomyopathy 05/24/2023     Priority: High    Ischemic cardiomyopathy due to MI, with normalization of EF 09/2023    Diabetic peripheral neuropathy (HCC) 10/31/2021     Priority: High   Diabetic retinopathy of both eyes without macular edema associated with type 2 diabetes mellitus (HCC) 08/08/2021     Priority: High   Hyperlipidemia with target LDL less than 70 08/01/2021     Priority: High    Hyperlipidemia    Type 2 diabetes mellitus with stage 4 chronic kidney disease (HCC) 05/18/2019     Priority: High  Baseline creatinine: 1.3-1.4, increased to 1.7 02/2024 and now following w/ renal, dr. Allena Katz Diabetic control worsening 08/2023: offered lantus; pt defers.  Farxiga and trulicity    Hypertension associated with diabetes (HCC) 10/27/2018     Priority: High    Essential hypertension    Chronic kidney disease, stage 4 (severe) (HCC) 10/27/2018     Priority: High   Osteoarthritis of knee, unspecified 11/12/2018     Priority: Medium    Osteopenia 11/12/2018     Priority: Medium     Dexa: 04/2020. tscore of -1.3. FRAX: 1.2%/9.3%. repeat 3 years. Continue calcium/vitamin D  DEXA: 08.2024 lowest T = -1.4; ostepenia, stable. No change. Repeat in 2 years.     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 She has never used smokeless tobacco. She reports that she does not drink alcohol and does not use drugs.  Review of Systems: Ophthalmic: negative for eye pain, loss of vision or double vision Cardiovascular: negative for chest pain Respiratory: negative for SOB or persistent cough Gastrointestinal: negative for abdominal pain Genitourinary: negative for dysuria or gross hematuria MSK: negative for foot lesions Neurologic: negative for weakness or gait disturbance  Objective  Vitals: BP (!) 165/90   Pulse 78   Temp 97.7 F (36.5 C)   Ht 5' 1.5" (1.562 m)   Wt 129 lb 3.2 oz (58.6 kg)   LMP  (LMP Unknown)   SpO2 98%   BMI 24.02 kg/m  General: well appearing, no acute distress  Psych:  Alert and oriented, normal mood and affect HEENT:  Normocephalic, atraumatic, moist mucous membranes, supple neck  Cardiovascular:  Nl S1 and S2, RRR without murmur, gallop or rub. no edema Respiratory:  Good breath sounds bilaterally, CTAB with normal effort, no rales Gastrointestinal: normal BS, soft, nontender Skin:  Warm, no rashes Neurologic:   Mental status is normal. normal gait Foot exam: no erythema, pallor, or cyanosis visible nl proprioception and sensation to monofilament testing bilaterally, +2 distal pulses bilaterally    Diabetic education: ongoing education regarding chronic disease management for diabetes was given today. We continue to reinforce the ABC's of diabetic management: A1c (<7 or 8 dependent upon patient), tight blood pressure control, and cholesterol management with goal LDL < 100 minimally. We discuss diet  strategies, exercise recommendations, medication options and possible side effects. At each visit, we review recommended immunizations and preventive care recommendations for diabetics and stress that good diabetic control can prevent other problems. See below for this patient's data.   Commons side effects, risks, benefits, and alternatives for medications and treatment plan prescribed today were discussed, and the patient expressed understanding of the given instructions. Patient is instructed to call or message via MyChart if he/she has any questions or concerns regarding our treatment plan. No barriers to understanding were identified. We discussed Red Flag symptoms and signs in detail. Patient expressed understanding  regarding what to do in case of urgent or emergency type symptoms.  Medication list was reconciled, printed and provided to the patient in AVS. Patient instructions and summary information was reviewed with the patient as documented in the AVS. This note was prepared with assistance of Dragon voice recognition software. Occasional wrong-word or sound-a-like substitutions may have occurred due to the inherent limitations of voice recognition software

## 2024-03-04 ENCOUNTER — Encounter: Payer: Self-pay | Admitting: Family Medicine

## 2024-03-04 ENCOUNTER — Telehealth: Payer: Self-pay | Admitting: Nurse Practitioner

## 2024-03-04 NOTE — Telephone Encounter (Signed)
*  STAT* If patient is at the pharmacy, call can be transferred to refill team.   1. Which medications need to be refilled? (please list name of each medication and dose if known)   dapagliflozin propanediol (FARXIGA) 10 MG TABS tablet     2. Would you like to learn more about the convenience, safety, & potential cost savings by using the [facility]? No   3. Are you open to using the [facility] (Type [facility].) No   4. Which pharmacy/location (including street and city if local pharmacy) is medication to be sent to?  [phone] , [place] - [address]        5. Do they need a 30 day or 90 day supply? 90 day

## 2024-03-04 NOTE — Progress Notes (Signed)
 See my chart note.

## 2024-05-01 ENCOUNTER — Other Ambulatory Visit: Payer: Self-pay | Admitting: Family Medicine

## 2024-05-01 MED ORDER — ONETOUCH ULTRA VI STRP: ORAL_STRIP | 3 refills | Status: AC

## 2024-05-01 NOTE — Telephone Encounter (Signed)
 Copied from CRM [id number]. Topic: Clinical - Medication Refill >> May 01, 2024 10:32 AM Freya Jesus wrote: Medication: Georgianne Kirsten test strip [[id number]]  Has the patient contacted their pharmacy? No (Agent: If no, request that the patient contact the pharmacy for the refill. If patient does not wish to contact the pharmacy document the reason why and proceed with request.) (Agent: If yes, when and what did the pharmacy advise?)  This is the patient's preferred pharmacy:  [id number] [place] [place] - [address] [place] [address] Phone: [phone] Fax: [phone]  Is this the correct pharmacy for this prescription? Yes If no, delete pharmacy and type the correct one.   Has the prescription been filled recently? No  Is the patient out of the medication? Yes  Has the patient been seen for an appointment in the last year OR does the patient have an upcoming appointment? Yes  Can we respond through MyChart? Yes  Agent: Please be advised that Rx refills may take up to 3 business days. We ask that you follow-up with your pharmacy.

## 2024-05-15 LAB — HM DIABETES EYE EXAM

## 2024-05-19 ENCOUNTER — Ambulatory Visit: Attending: Cardiovascular Disease | Admitting: Cardiovascular Disease

## 2024-05-19 ENCOUNTER — Encounter: Payer: Self-pay | Admitting: Cardiovascular Disease

## 2024-05-19 VITALS — BP 140/70 | HR 85 | Ht 60.0 in | Wt 127.0 lb

## 2024-05-19 DIAGNOSIS — E785 Hyperlipidemia, unspecified: Secondary | ICD-10-CM

## 2024-05-19 DIAGNOSIS — I255 Ischemic cardiomyopathy: Secondary | ICD-10-CM

## 2024-05-19 DIAGNOSIS — I152 Hypertension secondary to endocrine disorders: Secondary | ICD-10-CM

## 2024-05-19 DIAGNOSIS — I2510 Atherosclerotic heart disease of native coronary artery without angina pectoris: Secondary | ICD-10-CM | POA: Diagnosis not present

## 2024-05-19 DIAGNOSIS — E1159 Type 2 diabetes mellitus with other circulatory complications: Secondary | ICD-10-CM | POA: Diagnosis not present

## 2024-05-19 DIAGNOSIS — I2119 ST elevation (STEMI) myocardial infarction involving other coronary artery of inferior wall: Secondary | ICD-10-CM | POA: Diagnosis not present

## 2024-05-19 MED ORDER — CLOPIDOGREL BISULFATE 75 MG PO TABS
75.0000 mg | ORAL_TABLET | Freq: Every day | ORAL | 3 refills | Status: AC
Start: 2024-05-19 — End: ?

## 2024-05-19 NOTE — Progress Notes (Signed)
 05/19/2024 Wanda Medina   1951-03-16  [id number]  Primary Physician Luevenia Saha, MD Primary Cardiologist: Avanell Leigh MD Bennye Bravo, [place]  HPI:  Wanda Medina is a 73 y.o.  thin-appearing married [place] female mother of 3 children, grandmother of 2 grandchildren who is accompanied by one of her daughters Bernardino Bridge who is a divorce and family lawyer here in [place].  I last saw her in the office 11/18/2023.  She has a history of treated hypertension, hyperlipidemia and diabetes.  She does have a brother who has had stents as well.  She had an inferolateral STEMI 02/02/2023 which time I brought her to the Cath Lab revealing a subtotally occluded mid AV groove circumflex and a left dominant system and a 60% segmental mid LAD.  I performed PCI drug-eluting stenting using a 3 mm x 16 mm long Synergy drug-eluting stent postdilated to 3.33 mm.  She remains on DAPT with aspirin  and Brilinta .  She has been asymptomatic.  Her 2D echo at the time of her intervention revealed an EF of 45 to 50%.  Follow-up echo performed 06/26/2023 revealed normalization of her ejection fraction.   Since I saw her 6 months ago she remained stable.  She is completely asymptomatic and denies chest pain or shortness of breath.  She is planning on going to [place] later this year which I have encouraged her to do.  Current Meds  Medication Sig   ASPIRIN  LOW DOSE 81 MG tablet TAKE 1 TABLET BY MOUTH DAILY. SWALLOW WHOLE.   calcium -vitamin D  (OSCAL WITH D) 500-200 MG-UNIT tablet Take 1 tablet by mouth daily.   carvedilol  (COREG ) 3.125 MG tablet Take 1 tablet (3.125 mg total) by mouth 2 (two) times daily with a meal.   clopidogrel (PLAVIX) 75 MG tablet Take 1 tablet (75 mg total) by mouth daily.   cyclobenzaprine  (FLEXERIL ) 5 MG tablet TAKE 1 TABLET BY MOUTH 2 TIMES DAILY AS NEEDED FOR MUSCLE SPASMS.   dapagliflozin  propanediol (FARXIGA ) 10 MG TABS tablet TAKE 1 TABLET BY MOUTH DAILY  BEFORE BREAKFAST.   Dulaglutide  (TRULICITY ) 3 MG/0.5ML SOPN Inject 3 mg as directed once a week.   ferrous sulfate 324 (65 Fe) MG TBEC Take 324 mg by mouth daily.   fluticasone  (FLONASE ) 50 MCG/ACT nasal spray Place 2 sprays into both nostrils daily.   gabapentin  (NEURONTIN ) 100 MG capsule TAKE 1 CAPSULE BY MOUTH TWICE A DAY   glucose blood (ONETOUCH ULTRA) test strip TEST BLOOD SUGARS TWICE DAILY AS NEEDED.   Lancets (BD LANCET ULTRAFINE 30G) MISC 1 application by Does not apply route in the morning and at bedtime.   losartan  (COZAAR ) 100 MG tablet Take 1 tablet (100 mg total) by mouth daily.   montelukast  (SINGULAIR ) 10 MG tablet Take 1 tablet (10 mg total) by mouth at bedtime.   nitroGLYCERIN  (NITROSTAT ) 0.4 MG SL tablet Place 1 tablet (0.4 mg total) under the tongue every 5 (five) minutes x 3 doses as needed for chest pain.   omeprazole  (PRILOSEC) 20 MG capsule TAKE 1 CAPSULE BY MOUTH EVERY DAY   rosuvastatin  (CRESTOR ) 20 MG tablet Take 1 tablet (20 mg total) by mouth daily.   zinc gluconate 50 MG tablet Take 50 mg by mouth daily.   [DISCONTINUED] ticagrelor  (BRILINTA ) 90 MG TABS tablet Take 1 tablet (90 mg total) by mouth 2 (two) times daily.     Allergies  Allergen Reactions   Ace Inhibitors     cough   Altace [  Ramipril] Cough   Lisinopril Cough    Social History   Socioeconomic History   Marital status: Married    Spouse name: Not on file   Number of children: 3   Years of education: Not on file   Highest education level: Bachelor's degree (e.g., BA, AB, BS)  Occupational History   Not on file  Tobacco Use   Smoking status: Never   Smokeless tobacco: Never  Vaping Use   Vaping status: Never Used  Substance and Sexual Activity   Alcohol use: No   Drug use: No   Sexual activity: Not Currently  Other Topics Concern   Not on file  Social History Narrative   Not on file   Social Drivers of Health   Financial Resource Strain: Low Risk  (03/03/2024)   Overall  Financial Resource Strain (CARDIA)    Difficulty of Paying Living Expenses: Not hard at all  Food Insecurity: No Food Insecurity (03/03/2024)   Hunger Vital Sign    Worried About Running Out of Food in the Last Year: Never true    Ran Out of Food in the Last Year: Never true  Transportation Needs: No Transportation Needs (03/03/2024)   PRAPARE - Administrator, Civil Service (Medical): No    Lack of Transportation (Non-Medical): No  Physical Activity: Insufficiently Active (03/03/2024)   Exercise Vital Sign    Days of Exercise per Week: 5 days    Minutes of Exercise per Session: 20 min  Stress: No Stress Concern Present (03/03/2024)   [employer] of Occupational Health - Occupational Stress Questionnaire    Feeling of Stress : Not at all  Social Connections: Unknown (03/03/2024)   Social Connection and Isolation Panel [NHANES]    Frequency of Communication with Friends and Family: More than three times a week    Frequency of Social Gatherings with Friends and Family: More than three times a week    Attends Religious Services: Patient declined    Database administrator or Organizations: No    Attends Engineer, structural: Not on file    Marital Status: Married  Catering manager Violence: Not At Risk (02/04/2023)   Humiliation, Afraid, Rape, and Kick questionnaire    Fear of Current or Ex-Partner: No    Emotionally Abused: No    Physically Abused: No    Sexually Abused: No     Review of Systems: General: negative for chills, fever, night sweats or weight changes.  Cardiovascular: negative for chest pain, dyspnea on exertion, edema, orthopnea, palpitations, paroxysmal nocturnal dyspnea or shortness of breath Dermatological: negative for rash Respiratory: negative for cough or wheezing Urologic: negative for hematuria Abdominal: negative for nausea, vomiting, diarrhea, bright red blood per rectum, melena, or hematemesis Neurologic: negative for visual changes,  syncope, or dizziness All other systems reviewed and are otherwise negative except as noted above.    Blood pressure (!) 140/70, pulse 85, height 5' (1.524 m), weight 127 lb (57.6 kg), SpO2 98%.  General appearance: alert and no distress Neck: no adenopathy, no carotid bruit, no JVD, supple, symmetrical, trachea midline, and thyroid  not enlarged, symmetric, no tenderness/mass/nodules Lungs: clear to auscultation bilaterally Heart: regular rate and rhythm, S1, S2 normal, no murmur, click, rub or gallop Extremities: extremities normal, atraumatic, no cyanosis or edema Pulses: 2+ and symmetric Skin: Skin color, texture, turgor normal. No rashes or lesions Neurologic: Grossly normal  EKG EKG Interpretation Date/Time:  Tuesday May 19 2024 16:07:23 EDT Ventricular Rate:  85 PR  Interval:  174 QRS Duration:  76 QT Interval:  388 QTC Calculation: 461 R Axis:   -34  Text Interpretation: Normal sinus rhythm Possible Left atrial enlargement Left axis deviation Minimal voltage criteria for LVH, may be normal variant ( R in aVL ) Inferior infarct (cited on or before 02-Feb-2023) Anterolateral infarct (cited on or before 02-Feb-2023) When compared with ECG of 03-Oct-2023 14:44, No significant change was found Confirmed by Lauro Portal [phone]) on 05/19/2024 4:14:13 PM    ASSESSMENT AND PLAN:   Hypertension associated with diabetes (HCC) History of essential hypertension blood pressure measured today 148/70.  She is on carvedilol  and losartan .  Hyperlipidemia with target LDL less than 70 History of hyperlipidemia on statin therapy lipid profile performed 08/30/2023 revealing a total cholesterol 141, LDL 48 and HDL 78.  Acute ST elevation myocardial infarction (STEMI) of inferior wall (HCC) History of CAD status post inferolateral STEMI 01/29/2023.  Brought her to the Cath Lab revealing a subtotally occluded mid AV groove circumflex and a left dominant system with 60% segmental mid LAD.  I  performed PCI and stenting using a 3 mm x 16 mm long Synergy drug-eluting stent postdilated to 3.33 mm.  She remains on DAPT with aspirin  and Brilinta .  I am going to discontinue her Brilinta  and begin clopidogrel.  Ischemic cardiomyopathy History of ischemic cardiomyopathy with an EF initially at the time of presentation of 45 to 50%.  Subsequent echo performed 06/26/2023 revealed normalization of her EF.  She is asymptomatic.     Avanell Leigh MD FACP,FACC,FAHA, [facility] 05/19/2024 4:22 PM

## 2024-05-19 NOTE — Assessment & Plan Note (Signed)
 History of essential hypertension blood pressure measured today 148/70.  She is on carvedilol  and losartan .

## 2024-05-19 NOTE — Patient Instructions (Signed)
 Medication Instructions:  Stop Brilinta   START Clopidogrel 75 mg daily   *If you need a refill on your cardiac medications before your next appointment, please call your pharmacy*   Follow-Up: At [facility], you and your health needs are our priority.  As part of our continuing mission to provide you with exceptional heart care, our providers are all part of one team.  This team includes your primary Cardiologist (physician) and Advanced Practice Providers or APPs (Physician Assistants and Nurse Practitioners) who all work together to provide you with the care you need, when you need it.  Your next appointment:   1 year(s)  Provider:   Lauro Portal, MD    We recommend signing up for the patient portal called "MyChart".  Sign up information is provided on this After Visit Summary.  MyChart is used to connect with patients for Virtual Visits (Telemedicine).  Patients are able to view lab/test results, encounter notes, upcoming appointments, etc.  Non-urgent messages can be sent to your provider as well.   To learn more about what you can do with MyChart, go to [email or web address].

## 2024-05-19 NOTE — Assessment & Plan Note (Signed)
 History of hyperlipidemia on statin therapy lipid profile performed 08/30/2023 revealing a total cholesterol 141, LDL 48 and HDL 78.

## 2024-05-19 NOTE — Assessment & Plan Note (Signed)
 History of ischemic cardiomyopathy with an EF initially at the time of presentation of 45 to 50%.  Subsequent echo performed 06/26/2023 revealed normalization of her EF.  She is asymptomatic.

## 2024-05-19 NOTE — Assessment & Plan Note (Signed)
 History of CAD status post inferolateral STEMI 01/29/2023.  Brought her to the Cath Lab revealing a subtotally occluded mid AV groove circumflex and a left dominant system with 60% segmental mid LAD.  I performed PCI and stenting using a 3 mm x 16 mm long Synergy drug-eluting stent postdilated to 3.33 mm.  She remains on DAPT with aspirin  and Brilinta .  I am going to discontinue her Brilinta  and begin clopidogrel.

## 2024-06-03 ENCOUNTER — Encounter: Payer: Self-pay | Admitting: Family Medicine

## 2024-06-03 ENCOUNTER — Ambulatory Visit: Admitting: Family Medicine

## 2024-06-03 VITALS — BP 126/77 | HR 84 | Temp 97.7°F | Ht 60.0 in | Wt 125.4 lb

## 2024-06-03 DIAGNOSIS — E1122 Type 2 diabetes mellitus with diabetic chronic kidney disease: Secondary | ICD-10-CM

## 2024-06-03 DIAGNOSIS — E11319 Type 2 diabetes mellitus with unspecified diabetic retinopathy without macular edema: Secondary | ICD-10-CM

## 2024-06-03 DIAGNOSIS — E1142 Type 2 diabetes mellitus with diabetic polyneuropathy: Secondary | ICD-10-CM

## 2024-06-03 DIAGNOSIS — N184 Chronic kidney disease, stage 4 (severe): Secondary | ICD-10-CM

## 2024-06-03 DIAGNOSIS — E785 Hyperlipidemia, unspecified: Secondary | ICD-10-CM | POA: Diagnosis not present

## 2024-06-03 DIAGNOSIS — M179 Osteoarthritis of knee, unspecified: Secondary | ICD-10-CM

## 2024-06-03 DIAGNOSIS — L237 Allergic contact dermatitis due to plants, except food: Secondary | ICD-10-CM

## 2024-06-03 DIAGNOSIS — I252 Old myocardial infarction: Secondary | ICD-10-CM

## 2024-06-03 LAB — CBC WITH DIFFERENTIAL/PLATELET
Basophils Absolute: 0 10*3/uL (ref 0.0–0.1)
Basophils Relative: 1 % (ref 0.0–3.0)
Eosinophils Absolute: 0.4 10*3/uL (ref 0.0–0.7)
Eosinophils Relative: 7.8 % — ABNORMAL HIGH (ref 0.0–5.0)
HCT: 35.5 % — ABNORMAL LOW (ref 36.0–46.0)
Hemoglobin: 11.6 g/dL — ABNORMAL LOW (ref 12.0–15.0)
Lymphocytes Relative: 25.1 % (ref 12.0–46.0)
Lymphs Abs: 1.2 10*3/uL (ref 0.7–4.0)
MCHC: 32.7 g/dL (ref 30.0–36.0)
MCV: 92.2 fl (ref 78.0–100.0)
Monocytes Absolute: 0.4 10*3/uL (ref 0.1–1.0)
Monocytes Relative: 8.1 % (ref 3.0–12.0)
Neutro Abs: 2.7 10*3/uL (ref 1.4–7.7)
Neutrophils Relative %: 58 % (ref 43.0–77.0)
Platelets: 180 10*3/uL (ref 150.0–400.0)
RBC: 3.85 Mil/uL — ABNORMAL LOW (ref 3.87–5.11)
RDW: 14.2 % (ref 11.5–15.5)
WBC: 4.6 10*3/uL (ref 4.0–10.5)

## 2024-06-03 LAB — LIPID PANEL
Cholesterol: 134 mg/dL (ref 0–200)
HDL: 61.7 mg/dL (ref >39.00)
LDL Cholesterol: 54 mg/dL (ref 0–99)
NonHDL: 71.93
Total CHOL/HDL Ratio: 2
Triglycerides: 90 mg/dL (ref 0.0–149.0)
VLDL: 18 mg/dL (ref 0.0–40.0)

## 2024-06-03 LAB — COMPREHENSIVE METABOLIC PANEL WITH GFR
ALT: 20 U/L (ref 0–35)
AST: 25 U/L (ref 0–37)
Albumin: 3.9 g/dL (ref 3.5–5.2)
Alkaline Phosphatase: 56 U/L (ref 39–117)
BUN: 31 mg/dL — ABNORMAL HIGH (ref 6–23)
CO2: 26 meq/L (ref 19–32)
Calcium: 9.2 mg/dL (ref 8.4–10.5)
Chloride: 106 meq/L (ref 96–112)
Creatinine, Ser: 1.69 mg/dL — ABNORMAL HIGH (ref 0.40–1.20)
GFR: 29.64 mL/min — ABNORMAL LOW (ref >60.00)
Glucose, Bld: 120 mg/dL — ABNORMAL HIGH (ref 70–99)
Potassium: 4.4 meq/L (ref 3.5–5.1)
Sodium: 139 meq/L (ref 135–145)
Total Bilirubin: 0.4 mg/dL (ref 0.2–1.2)
Total Protein: 7 g/dL (ref 6.0–8.3)

## 2024-06-03 LAB — POCT GLYCOSYLATED HEMOGLOBIN (HGB A1C): Hemoglobin A1C: 6.9 % — AB (ref 4.0–5.6)

## 2024-06-03 LAB — MICROALBUMIN / CREATININE URINE RATIO
Creatinine,U: 50 mg/dL
Microalb Creat Ratio: 46.8 mg/g — ABNORMAL HIGH (ref 0.0–30.0)
Microalb, Ur: 2.3 mg/dL — ABNORMAL HIGH (ref 0.0–1.9)

## 2024-06-03 MED ORDER — TRULICITY 3 MG/0.5ML ~~LOC~~ SOAJ: 3.0000 mg | SUBCUTANEOUS | 3 refills | Status: AC

## 2024-06-03 MED ORDER — PANTOPRAZOLE SODIUM 20 MG PO TBEC
20.0000 mg | DELAYED_RELEASE_TABLET | Freq: Every day | ORAL | 3 refills | Status: DC
Start: 2024-06-03 — End: 2024-06-17

## 2024-06-03 MED ORDER — BETAMETHASONE DIPROPIONATE 0.05 % EX CREA: TOPICAL_CREAM | Freq: Two times a day (BID) | CUTANEOUS | 0 refills | Status: AC

## 2024-06-03 NOTE — Patient Instructions (Addendum)
 Please return in 6 months for your annual complete physical; please come fasting.  For follow up on chronic medical conditions   I will release your lab results to you on your MyChart account with further instructions. You may see the results before I do, but when I review them I will send you a message with my report or have my assistant call you if things need to be discussed. Please reply to my message with any questions. Thank you!   If you have any questions or concerns, please don't hesitate to send me a message via MyChart or call the office at [phone]. Thank you for visiting with us  today! It's our pleasure caring for you.    VISIT SUMMARY: Today, you had a follow-up visit to review your diabetes, hypertension, and other health concerns. Your diabetes is well-controlled, and your blood pressure has improved with recent medication adjustments. We also discussed your recent rash, likely from poison ivy or oak, and your current medications, including a change to your heartburn medication due to an interaction with your heart medication.  YOUR PLAN: -HYPERTENSION WITH RETINOPATHY AND NEPHROPATHY: Hypertension, or high blood pressure, can lead to complications like retinopathy (eye damage) and nephropathy (kidney damage). Your blood pressure has improved with your current medication. We will continue your current treatment and order urine and blood tests to monitor your kidney health.  -POST-MYOCARDIAL INFARCTION MANAGEMENT: After a heart attack, it's important to manage your heart health carefully. You are currently on Plavix , a medication to prevent blood clots. We will change your heartburn medication from omeprazole  to Nexium to avoid interactions with Plavix .  -DIABETES MELLITUS: Diabetes is a condition where your blood sugar levels are too high. Your diabetes is well-controlled with an A1c of 6.8%. We will order urine and blood tests to ensure everything remains stable and renew your  Trulicity  prescription.  -CONTACT DERMATITIS (LIKELY POISON IVY OR OAK): Contact dermatitis is a skin reaction to an irritant, like poison ivy or oak. Your rash has improved with hydrocortisone cream. We will prescribe a stronger hydrocortisone cream to help it heal completely.  INSTRUCTIONS: Please continue taking your current medications as prescribed. We will order urine and blood tests to monitor your kidney health and diabetes. You will also receive a new prescription for Trulicity  and a stronger hydrocortisone cream for your rash. Additionally, switch your heartburn medication from omeprazole  to Nexium. Follow up with us  if you have any new symptoms or concerns.                      Contains text generated by Abridge.                                 Contains text generated by Abridge.

## 2024-06-03 NOTE — Progress Notes (Signed)
 Subjective  CC:  Chief Complaint  Patient presents with   Diabetes    HPI: Wanda Medina is a 73 y.o. female who presents to the office today for follow up of diabetes and problems listed above in the chief complaint.  Discussed the use of AI scribe software for clinical note transcription with the patient, who gave verbal consent to proceed.  History of Present Illness Wanda Medina is a 73 year old female with diabetes and hypertension who presents for a follow-up visit.  Her diabetes is well-controlled with a recent A1c of 6.8%. She is currently on Trulicity  but missed her dose last night due to running out of the medication.  Her blood pressure medication was increased in March, leading to improved blood pressure readings, although she has not been checking her blood pressure at home recently. She has retinopathy and nephropathy as complications of her hypertension.  She has a history of myocardial infarction and was recently switched from Brilinta  to Plavix . She is considering changing her heartburn medication from omeprazole  to Nexium due to its interaction with Plavix . No current chest pain or heart issues are reported.  She developed a widespread, itchy, and red rash after gardening, which she attributes to poison oak or poison ivy exposure. The rash has improved with prescription-strength hydrocortisone provided by her daughter.  Her husband recently underwent emergency back surgery, which required airlifting to [facility]. This event has been stressful, but she reports no exacerbation of her own health issues such as chest pain or blood pressure spikes during this period.  No chest pain, heartburn issues, new heart problems, swelling in her feet, or sores, except for the rash from poison exposure.    Wt Readings from Last 3 Encounters:  06/03/24 125 lb 6.4 oz (56.9 kg)  05/19/24 127 lb (57.6 kg)  03/03/24 129 lb 3.2 oz (58.6 kg)    BP Readings  from Last 3 Encounters:  06/03/24 126/77  05/19/24 (!) 140/70  03/03/24 (!) 165/90    Assessment  1. Type 2 diabetes mellitus with stage 4 chronic kidney disease, without long-term current use of insulin  (HCC)   2. Diabetic peripheral neuropathy (HCC)   3. 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 History of ST elevation myocardial infarction (STEMI)   5. Hyperlipidemia with target LDL less than 70   6. Chronic kidney disease, stage 4 (severe) (HCC)   7. Osteoarthritis of knee, unspecified   8. Poison ivy      Plan  Assessment and Plan Assessment & Plan Hypertension with Retinopathy and Nephropathy Hypertension improved with medication adjustment. Blood pressure control essential due to myocardial infarction and nephropathy. - Continue current antihypertensive regimen. - Order urine and blood tests to monitor nephropathy.  Post-Myocardial Infarction Management On Plavix  post-stent. Omeprazole  requires change due to interaction with Plavix . - Change omeprazole  to Nexium.  Diabetes Mellitus Diabetes controlled with A1c of 6.8%. Stable on current regimen. Missed Trulicity  dose. - Order urine and blood tests to ensure stability. - Order Trulicity  prescription.  Contact Dermatitis (likely Poison Ivy or [place]) Rash improved with hydrocortisone cream. - Prescribe prescription strength hydrocortisone cream.    Follow up: 6 mo for cpe and chronic problem f/u Orders Placed This Encounter  Procedures   Lipid panel   CBC with Differential/Platelet   Comprehensive metabolic panel with GFR   Microalbumin / creatinine urine ratio   POCT HgB A1C  Meds ordered this encounter  Medications   Dulaglutide  (TRULICITY ) 3 MG/0.5ML SOAJ    Sig: Inject 3 mg as directed once a week.    Dispense:  6 mL    Refill:  3   betamethasone dipropionate 0.05 % cream    Sig: Apply topically 2 (two)  times daily. For 1-2 weeks for poison ivy. Do not use on face    Dispense:  30 g    Refill:  0   pantoprazole  (PROTONIX ) 20 MG tablet    Sig: Take 1 tablet (20 mg total) by mouth daily.    Dispense:  90 tablet    Refill:  3      Immunization History  Administered Date(s) Administered   Fluad Quad(high Dose 65+) 10/11/2021   Fluad Trivalent(High Dose 65+) 09/03/2023   Influenza, High Dose Seasonal PF 12/18/2018   Influenza-Unspecified 11/20/2019, 09/18/2020   PFIZER(Purple Top)SARS-COV-2 Vaccination 01/23/2020, 02/17/2020, 09/18/2020, 07/16/2021   Pfizer Covid-19 Vaccine Bivalent Booster 57yrs & up 11/04/2021   Pfizer(Comirnaty)Fall Seasonal Vaccine 12 years and older 09/16/2023   Pneumococcal Conjugate-13 09/09/2016   Pneumococcal Polysaccharide-23 10/27/2018   Respiratory Syncytial Virus Vaccine,Recomb Aduvanted(Arexvy) 09/16/2023   Zoster Recombinant(Shingrix) 08/24/2019, 11/20/2019    Diabetes Related Lab Review: Lab Results  Component Value Date   HGBA1C 6.9 (A) 06/03/2024   HGBA1C 7.0 (A) 03/03/2024   HGBA1C 7.8 (H) 09/03/2023    Lab Results  Component Value Date   MICROALBUR 9.8 (H) 09/03/2023   Lab Results  Component Value Date   CREATININE 1.5 (A) 01/07/2024   BUN 23 10/21/2023   NA 140 10/21/2023   K 4.5 10/21/2023   CL 104 10/21/2023   CO2 21 10/21/2023   Lab Results  Component Value Date   CHOL 141 09/03/2023   CHOL 111 02/05/2023   CHOL 161 08/22/2022   Lab Results  Component Value Date   HDL 78.40 09/03/2023   HDL 52 02/05/2023   HDL 77.90 08/22/2022   Lab Results  Component Value Date   LDLCALC 48 09/03/2023   LDLCALC 43 02/05/2023   LDLCALC 70 08/22/2022   Lab Results  Component Value Date   TRIG 73.0 09/03/2023   TRIG 78 02/05/2023   TRIG 69.0 08/22/2022   Lab Results  Component Value Date   CHOLHDL 2 09/03/2023   CHOLHDL 2.1 02/05/2023   CHOLHDL 2 08/22/2022   No results found for: LDLDIRECT The ASCVD Risk score (Arnett  DK, et al., 2019) failed to calculate for the following reasons:   Risk score cannot be calculated because patient has a medical history suggesting prior/existing ASCVD I have reviewed the PMH, Fam and Soc history. 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 tobie Diabetic  control worsening 08/2023: offered lantus ; pt defers.  Farxiga  and trulicity     Hypertension associated with diabetes (HCC) 10/27/2018    Priority: High    Essential hypertension    Chronic kidney disease, stage 4 (severe) (HCC) 10/27/2018    Priority: High   Osteoarthritis of knee, unspecified 11/12/2018    Priority: Medium    Osteopenia 11/12/2018    Priority: Medium     Dexa: 04/2020. tscore of -1.3. FRAX: 1.2%/9.3%. repeat 3 years. Continue calcium /vitamin D   DEXA: 08.2024 lowest T = -1.4; ostepenia, stable. No change. Repeat in 2 years.     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 She has never used smokeless tobacco. She reports that she does not drink alcohol and does not use drugs.  Review of Systems: Ophthalmic: negative for eye pain, loss of vision or double vision Cardiovascular: negative for chest pain Respiratory: negative for SOB or persistent cough Gastrointestinal: negative for abdominal pain Genitourinary: negative for dysuria or gross hematuria MSK: negative for foot lesions Neurologic: negative for weakness or gait disturbance  Objective  Vitals: BP 126/77   Pulse 84   Temp 97.7 F (36.5 C)   Ht 5' (1.524 m)   Wt 125 lb 6.4 oz (56.9 kg)   LMP  (LMP Unknown)   SpO2 98%   BMI 24.49 kg/m  General: well appearing, no acute distress  Psych:  Alert and oriented, normal mood and affect HEENT:  Normocephalic, atraumatic, moist mucous membranes, supple neck  Cardiovascular:  Nl S1 and S2, RRR without murmur, gallop or rub. no edema Respiratory:  Good breath sounds bilaterally, CTAB with normal effort, no rales   Diabetic education: ongoing education regarding chronic disease management for diabetes was given today. We continue to reinforce the ABC's of diabetic management: A1c (<7 or 8 dependent upon patient), tight blood pressure control, and cholesterol management with goal LDL < 100 minimally. We discuss diet strategies, exercise recommendations, medication options and possible side effects. At each visit, we review recommended immunizations and preventive care recommendations for diabetics and stress that good diabetic control can prevent other problems. See below for this patient's data. Commons side effects, risks, benefits, and alternatives for medications and treatment plan prescribed today were discussed, and  the patient expressed understanding of the given instructions. Patient is instructed to call or message via MyChart if he/she has any questions or concerns regarding our treatment plan. No barriers to understanding were identified. We discussed Red Flag symptoms and signs in detail. Patient expressed understanding regarding what to do in case of urgent or emergency type symptoms.  Medication list was reconciled, printed and provided to the patient in AVS. Patient instructions and summary information was reviewed with the patient as documented in the AVS. This note was prepared with assistance of Dragon voice recognition software. Occasional wrong-word or sound-a-like substitutions may have occurred due to the inherent limitations of voice recognition software

## 2024-06-04 ENCOUNTER — Other Ambulatory Visit: Payer: Self-pay | Admitting: Family

## 2024-06-08 ENCOUNTER — Telehealth: Payer: Self-pay | Admitting: Family Medicine

## 2024-06-08 NOTE — Telephone Encounter (Unsigned)
 Copied from CRM [id number]. Topic: Clinical - Medication Refill >> Jun 08, 2024  3:47 PM Mercedes MATSU wrote: Medication: cyclobenzaprine  (FLEXERIL ) 5 MG tablet  Has the patient contacted their pharmacy? Yes (Agent: If no, request that the patient contact the pharmacy for the refill. If patient does not wish to contact the pharmacy document the reason why and proceed with request.) (Agent: If yes, when and what did the pharmacy advise?)  This is the patient's preferred pharmacy:  [id number] [place], [place] - [address] [place] [address] Phone: [phone] Fax: [phone]  Is this the correct pharmacy for this prescription? Yes If no, delete pharmacy and type the correct one.   Has the prescription been filled recently? Yes  Is the patient out of the medication? Yes  Has the patient been seen for an appointment in the last year OR does the patient have an upcoming appointment? Yes  Can we respond through MyChart? Yes  Agent: Please be advised that Rx refills may take up to 3 business days. We ask that you follow-up with your pharmacy.

## 2024-06-09 MED ORDER — CYCLOBENZAPRINE HCL 5 MG PO TABS: 5.0000 mg | ORAL_TABLET | Freq: Two times a day (BID) | ORAL | 3 refills | Status: AC

## 2024-06-12 ENCOUNTER — Ambulatory Visit: Payer: Self-pay | Admitting: Family Medicine

## 2024-06-12 NOTE — Progress Notes (Signed)
 See mychart note Dear Ms. Tobie, Your lab results are stable. Your kidney function is stable; please follow up with the kidney doctor as recommended. No changes are needed at this time.  Sincerely, Dr. Jodie

## 2024-06-16 ENCOUNTER — Ambulatory Visit: Payer: Self-pay

## 2024-06-16 NOTE — Telephone Encounter (Signed)
 FYI Only or Action Required?: Action required by provider: clinical question for provider and update on patient condition.  Patient was last seen in primary care on 06/03/2024 by Jodie Lavern CROME, MD.  Called Nurse Triage reporting Medication Problem.  Symptoms began several days ago.  Interventions attempted: Other: pepcid, smaller meals.  Symptoms are: unchanged.  Triage Disposition: Call PCP Now  Patient/caregiver understands and will follow disposition?: Yes   Copied from CRM [id number]. Topic: Clinical - Red Word Triage >> Jun 16, 2024  9:30 AM Powell HERO wrote: Red Word that prompted transfer to Nurse Triage: Patient states her medication is not working, she is throwing up and she cannot eat anything and is feeling awful. pantoprazole  (PROTONIX ) 20 MG tablet is what she was prescribed, doesn't know  how to proceed. Reason for Disposition  [1] Caller has URGENT medicine question about med that PCP or specialist prescribed AND [2] triager unable to answer question  Answer Assessment - Initial Assessment Questions 1. NAME of MEDICINE: What medicine(s) are you calling about?     pantoprazole  2. QUESTION: What is your question? (e.g., double dose of medicine, side effect)     Not controlling reflux 3. PRESCRIBER: Who prescribed the medicine? Reason: if prescribed by specialist, call should be referred to that group.     Lavern Jodie 4. SYMPTOMS: Do you have any symptoms? If Yes, ask: What symptoms are you having?  How bad are the symptoms (e.g., mild, moderate, severe)     Reflux/heartburn, nausea and vomiting. Patient reports that she is taking pantoprazole  30 minutes before breakfast and that blood sugars are stable  Patient would like different medication or stronger dosage Patient can be reached at [phone]  Protocols used: Medication Question Call-A-AH

## 2024-06-16 NOTE — Telephone Encounter (Signed)
 Please call patient to schedule an appointment per Dr. Jodie.

## 2024-06-16 NOTE — Telephone Encounter (Signed)
 Dr. Jodie, see message and advise.

## 2024-06-17 ENCOUNTER — Encounter: Payer: Self-pay | Admitting: Family Medicine

## 2024-06-17 ENCOUNTER — Ambulatory Visit: Admitting: Family Medicine

## 2024-06-17 VITALS — BP 109/66 | HR 99 | Temp 97.7°F | Ht 60.0 in | Wt 125.0 lb

## 2024-06-17 DIAGNOSIS — K219 Gastro-esophageal reflux disease without esophagitis: Secondary | ICD-10-CM

## 2024-06-17 MED ORDER — PANTOPRAZOLE SODIUM 20 MG PO TBEC: 40.0000 mg | DELAYED_RELEASE_TABLET | Freq: Every day | ORAL | Status: DC

## 2024-06-17 NOTE — Progress Notes (Signed)
 Subjective  CC:  Chief Complaint  Patient presents with   GI Problem    Pt stated that she has been experiencing some stomach burning and her food feels like it is coming back up since last Thrusday    HPI: Wanda Medina is a 73 y.o. female who presents to the office today to address the problems listed above in the chief complaint. Discussed the use of AI scribe software for clinical note transcription with the patient, who gave verbal consent to proceed.  History of Present Illness Wanda Medina is a 73 year old female with a history of heartburn and kidney issues who presents with worsening heartburn symptoms.  Her heartburn symptoms began suddenly on Thursday, described as a sensation of 'burning up'. Eating exacerbates her symptoms, causing a feeling of potential vomiting, although she has not vomited. She believes vomiting might provide relief.  She has been taking Protonix  20 mg for about a week after switching from omeprazole  due to now being on plavix . Initially, she continued omeprazole  for two to three days due to a delay in obtaining Protonix . The Protonix  does not seem effective as her symptoms persist. She has a history of taking omeprazole  and Zantac for heartburn but cannot recall when she started these medications. Pepcid was tried without significant relief.  She cannot take anti-inflammatory medications like Aleve due to kidney issues. She has not eaten today and reports back pain. A pharmacist friend suggested trying Nexium or increasing the Protonix  dose to 40 mg, which she did yesterday with noted improvement in her symptoms.  No nausea, vomiting, abdominal pain, diarrhea, or blood in stool.   Assessment  1. Gastroesophageal reflux disease, unspecified whether esophagitis present      Plan  Assessment and Plan Assessment & Plan Heartburn Current Protonix  dose may be inadequate. Consideration of increasing dose or switching to  Nexium due to Plavix  use. - Increase Protonix  to 40 mg for one week. - Monitor symptoms and report in one week. - Consider switching to prevacid (can't use nexium w/ plavix  either) - Refer to GI specialist if no improvement.    Follow up: prn No orders of the defined types were placed in this encounter.  Meds ordered this encounter  Medications   pantoprazole  (PROTONIX ) 20 MG tablet    Sig: Take 2 tablets (40 mg total) by mouth daily.     I reviewed the patients updated PMH, FH, and SocHx.  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 associated with type 2 diabetes mellitus (HCC) 08/08/2021    Priority: High   Hyperlipidemia with target LDL less than 70 08/01/2021    Priority: High   Type 2 diabetes mellitus with stage 4 chronic kidney disease (HCC) 05/18/2019    Priority: High   Hypertension associated with diabetes (HCC) 10/27/2018    Priority: High   Chronic kidney disease, stage 4 (severe) (HCC) 10/27/2018    Priority: High   Osteoarthritis of knee, unspecified 11/12/2018    Priority: Medium    Osteopenia 11/12/2018    Priority: Medium    GERD (gastroesophageal reflux disease) 10/27/2018    Priority: Medium    Arthritis 10/27/2018    Priority: Medium  B12 deficiency 11/20/2018    Priority: Low   Allergic rhinitis 10/27/2018    Priority: Low   ST elevation myocardial infarction (STEMI) (HCC) 02/19/2023   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 SWALLOW WHOLE.   betamethasone  dipropionate 0.05 % cream Apply topically 2 (two) times daily. For 1-2 weeks for poison ivy. Do not use on face   calcium -vitamin D  (OSCAL WITH D) 500-200 MG-UNIT tablet Take 1 tablet by mouth daily.   carvedilol  (COREG ) 3.125 MG tablet Take 1 tablet (3.125 mg total) by mouth 2 (two) times daily with a meal.   clopidogrel  (PLAVIX ) 75 MG tablet Take 1 tablet (75 mg total) by mouth daily.   cyclobenzaprine  (FLEXERIL ) 5 MG tablet Take 1 tablet (5 mg total) by mouth 2 (two) times daily.   dapagliflozin  propanediol (FARXIGA ) 10 MG TABS tablet TAKE 1 TABLET BY MOUTH DAILY BEFORE BREAKFAST.   Dulaglutide  (TRULICITY ) 3 MG/0.5ML SOAJ Inject 3 mg as directed once a week.   ferrous sulfate 324 (65 Fe) MG TBEC Take 324 mg by mouth daily.   fluticasone  (FLONASE ) 50 MCG/ACT nasal spray Place 2 sprays into both nostrils daily.   gabapentin  (NEURONTIN ) 100 MG capsule TAKE 1 CAPSULE BY MOUTH TWICE A DAY   glucose blood (ONETOUCH ULTRA) test strip TEST BLOOD SUGARS TWICE DAILY AS NEEDED.   Lancets (BD LANCET ULTRAFINE 30G) MISC 1 application by Does not apply route in the morning and at bedtime.   losartan  (COZAAR ) 100 MG tablet Take 1 tablet (100 mg total) by mouth daily.   montelukast  (SINGULAIR ) 10 MG tablet Take 1 tablet (10 mg total) by mouth at bedtime.   nitroGLYCERIN  (NITROSTAT ) 0.4 MG SL tablet Place 1 tablet (0.4 mg total) under the tongue every 5 (five) minutes x 3 doses as needed for chest pain.   rosuvastatin  (CRESTOR ) 20 MG tablet Take 1 tablet (20 mg total) by mouth daily.   zinc gluconate 50 MG tablet Take 50 mg by mouth daily.   [DISCONTINUED] pantoprazole  (PROTONIX ) 20 MG tablet Take 1 tablet (20 mg total) by mouth daily.   Allergies: Patient is allergic to ace inhibitors, altace [ramipril], and lisinopril. Family History: Patient family history includes Arthritis in her mother; COPD in her father and mother; Heart attack in her  brother, father, and mother. Social History:  Patient  reports that she has never smoked. She has never used smokeless tobacco. She reports that she does not drink alcohol and does not use drugs.  Review of Systems: Constitutional: Negative for fever malaise or anorexia Cardiovascular: negative for chest pain Respiratory: negative for SOB or persistent cough Gastrointestinal: negative for abdominal pain  Objective  Vitals: BP 109/66   Pulse 99   Temp 97.7 F (36.5 C)   Ht 5' (1.524 m)   Wt 125 lb (56.7 kg)   LMP  (LMP Unknown)   SpO2 98%   BMI 24.41 kg/m  General: no acute distress , A&Ox3 HEENT: PEERL, conjunctiva normal, neck is supple Gastrointestinal: soft, flat abdomen, normal active bowel sounds, no palpable masses, no hepatosplenomegaly, no appreciated hernias Non tender  Commons side effects, risks, benefits, and alternatives for medications and treatment plan prescribed today were discussed, and the patient expressed understanding of  the given instructions. Patient is instructed to call or message via MyChart if he/she has any questions or concerns regarding our treatment plan. No barriers to understanding were identified. We discussed Red Flag symptoms and signs in detail. Patient expressed understanding regarding what to do in case of urgent or emergency type symptoms.  Medication list was reconciled, printed and provided to the patient in AVS. Patient instructions and summary information was reviewed with the patient as documented in the AVS. This note was prepared with assistance of Dragon voice recognition software. Occasional wrong-word or sound-a-like substitutions may have occurred due to the inherent limitations of voice recognition software

## 2024-06-25 ENCOUNTER — Telehealth: Payer: Self-pay

## 2024-06-25 NOTE — Telephone Encounter (Signed)
 Copied from CRM [id number]. Topic: General - Other >> Jun 25, 2024 12:01 PM Paige D wrote: Reason for CRM: Pantoprazale 20 MG. Pt is taking prescription and it is working very well. Pt needs a refill on prescription as she is taking two and would like new prescription of 40mg  instead of 20mg  so she only has to take 1 pill a day.  I see on her med list it is 20mg . Ok to send in the 40mg .  Message has been routed to provider to address

## 2024-06-26 ENCOUNTER — Other Ambulatory Visit: Payer: Self-pay

## 2024-06-26 MED ORDER — PANTOPRAZOLE SODIUM 40 MG PO TBEC: 40.0000 mg | DELAYED_RELEASE_TABLET | Freq: Every day | ORAL | 3 refills | Status: DC

## 2024-06-26 NOTE — Telephone Encounter (Signed)
 Rx filled

## 2024-07-15 ENCOUNTER — Ambulatory Visit: Admitting: Family Medicine

## 2024-07-15 ENCOUNTER — Encounter: Payer: Self-pay | Admitting: Family Medicine

## 2024-07-15 VITALS — BP 145/77 | HR 89 | Temp 97.7°F | Ht 60.0 in | Wt 125.0 lb

## 2024-07-15 DIAGNOSIS — K119 Disease of salivary gland, unspecified: Secondary | ICD-10-CM | POA: Diagnosis not present

## 2024-07-15 DIAGNOSIS — K115 Sialolithiasis: Secondary | ICD-10-CM

## 2024-07-15 NOTE — Patient Instructions (Signed)
 Please follow up if symptoms do not improve or as needed.    We will call you to get you set up for a CT scan of your neck and to see an ENT doctor.   Salivary Stone  A salivary stone is a small cluster of mineral (mineral deposit) that builds up in the tubes (ducts) that drain the salivary glands. Most salivary stones are made of calcium . When a stone forms, saliva can back up into the gland and cause painful swelling. Your salivary glands are the glands that make saliva. You have six major salivary glands. Each gland has a duct that carries saliva into your mouth. Saliva keeps your mouth moist and breaks down the food that you eat. It also helps prevent tooth decay. Two salivary glands are found just in front of your ears (parotid). The ducts for these glands open up inside your cheeks, near your back teeth. You also have two glands under your tongue (sublingual) and two glands under your jaw (submandibular). The ducts for these glands open under your tongue. A stone can form in any salivary gland. The most common place for a salivary stone to form is in a submandibular salivary gland. What are the causes? Salivary stones may be caused by any condition that lessens the flow of saliva. It is not known why some people get stones. What increases the risk? You are more likely to develop this condition if: You do not drink enough water. You smoke. You have any of these: High blood pressure. Gout. Diabetes. What are the signs or symptoms? The main sign of a salivary stone is sudden swelling of a salivary gland during eating. This usually happens under the jaw on one side. Other signs and symptoms may include: Swelling of the cheek or under the tongue during eating. Pain in the swollen area. Trouble chewing or swallowing. Swelling that goes down after eating. Sometimes, the salivary stone may be seen. The stone is oval in shape and may be white or yellow in color. How is this diagnosed? This  condition may be diagnosed based on: Your signs and symptoms. A physical exam. In many cases, your health care provider will be able to feel the stone in a duct inside your mouth. Imaging studies, such as: X-rays. Ultrasound. CT scan. MRI. You may need to see an ear, nose, and throat specialist (ENT or otolaryngologist) for diagnosis and treatment. How is this treated? Treatment for this condition depends on the size of the stone. A small stone that is not causing symptoms may be treated with home care. A stone that is large enough to cause symptoms may be treated by: Probing and widening of the duct to let the stone pass. Putting a thin, flexible scope (endoscope) into the duct to find and remove the stone. Breaking up the stone with sound waves. Removing the whole salivary gland. Follow these instructions at home: To relieve discomfort Take NSAIDs, such as ibuprofen, to help relieve pain and swelling as told by your health care provider. Follow these instructions every few hours: Suck on a lemon candy or a vitamin C lozenge to prompt the flow of saliva. Put a warm, damp cloth (warmcompress) over the gland. Gently massage the gland. General instructions  Take over-the-counter and prescription medicines only as told by your health care provider. Drink enough fluid to keep your urine pale yellow. Do not use any products that contain nicotine or tobacco. These products include cigarettes, chewing tobacco, and vaping devices, such as  e-cigarettes. If you need help quitting, ask your health care provider. Keep all follow-up visits. This is important. Contact a health care provider if: You have pain and swelling in your face, jaw, or mouth after eating. You keep having swelling in any of these places: In front of your ear. Under your jaw. Inside your mouth. Get help right away if: You have pain and swelling in your face, jaw, or mouth, that suddenly gets worse. Your pain and swelling  make it hard to swallow, talk, or breathe. These symptoms may be an emergency. Get help right away. Call 911. Do not wait to see if the symptoms will go away. Do not drive yourself to the hospital. Summary A salivary stone is a small clump of mineral (mineral deposit) that builds up in the ducts that drain your salivary glands. When a stone forms, saliva can back up into the gland and cause painful swelling. Salivary stones may be caused by any condition that lessens the flow of saliva. Treatment for this condition depends on the size of the stone. This information is not intended to replace advice given to you by your health care provider. Make sure you discuss any questions you have with your health care provider. Document Revised: 11/22/2021 Document Reviewed: 11/22/2021 Elsevier Patient Education  2024 [employer].

## 2024-07-15 NOTE — Progress Notes (Signed)
 Subjective  CC:  Chief Complaint  Patient presents with   swollen glan on the Right side of neck    Pt stated that she noticed a swollen gland on the rt side of her neck last Tuesday and it hurts    HPI: Wanda Medina is a 73 y.o. female who presents to the office today to address the problems listed above in the chief complaint. Discussed the use of AI scribe software for clinical note transcription with the patient, who gave verbal consent to proceed.  History of Present Illness Wanda Medina is a 72 year old female who presents with a painful knot in the neck area.  She noticed a painful knot in her neck area on Tuesday after returning from a trip to [place]  for her grandson's birthday. The knot is tender to touch but has not increased in size since she first noticed it.  Initially, the pain was severe enough to prevent her from opening her mouth or eating, but this has improved over the last two days. She currently does not experience pain when eating.  No ear pain, sore throat, or cuts on the skin. She mentions having a blister under her tongue around the same time, which has since improved.  Her daughter has a history of thyroid  cancer, which is a concern for her. No f/c/s. No trauma. No neck masses.    Assessment  1. Salivary gland disorder   2. Salivary duct stone      Plan  Assessment and Plan Assessment & Plan Suspected submandibular salivary gland stone Tenderness and palpable knot in submandibular region suggest possible salivary gland obstruction or infection. Symptoms improved but require further evaluation. - Order noncontrast enhanced CT scan of the neck to evaluate for salivary stone. - Refer to ENT for further evaluation and management. - Provided handout on symptom management.    Follow up: prn Orders Placed This Encounter  Procedures   CT SOFT TISSUE NECK WO CONTRAST   Ambulatory referral to ENT   No orders of the  defined types were placed in this encounter.    I reviewed the patients updated PMH, FH, and SocHx.  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 associated with type 2 diabetes mellitus (HCC) 08/08/2021    Priority: High   Hyperlipidemia with target LDL less than 70 08/01/2021    Priority: High   Type 2 diabetes mellitus with stage 4 chronic kidney disease (HCC) 05/18/2019    Priority: High   Hypertension associated with diabetes (HCC) 10/27/2018    Priority: High   Chronic kidney disease, stage 4 (severe) (HCC) 10/27/2018    Priority: High   Osteoarthritis of knee, unspecified 11/12/2018    Priority: Medium    Osteopenia 11/12/2018    Priority: Medium    GERD (gastroesophageal reflux disease) 10/27/2018    Priority: Medium    Arthritis 10/27/2018    Priority: Medium    B12 deficiency 11/20/2018    Priority: Low   Allergic rhinitis 10/27/2018    Priority: Low   ST elevation myocardial infarction (STEMI) (HCC) 02/19/2023   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 SWALLOW WHOLE.   betamethasone  dipropionate 0.05 % cream Apply topically 2 (two) times daily. For 1-2 weeks for poison ivy. Do not use on face   calcium -vitamin D  (OSCAL WITH D) 500-200 MG-UNIT tablet Take 1 tablet by mouth daily.   carvedilol  (COREG ) 3.125 MG tablet Take 1 tablet (3.125 mg total) by mouth 2 (two) times daily with a meal.   clopidogrel  (PLAVIX ) 75 MG tablet  Take 1 tablet (75 mg total) by mouth daily.   cyclobenzaprine  (FLEXERIL ) 5 MG tablet Take 1 tablet (5 mg total) by mouth 2 (two) times daily.   dapagliflozin  propanediol (FARXIGA ) 10 MG TABS tablet TAKE 1 TABLET BY MOUTH DAILY BEFORE BREAKFAST.   Dulaglutide  (TRULICITY ) 3 MG/0.5ML SOAJ Inject 3 mg as directed once a week.   ferrous sulfate 324 (65 Fe) MG TBEC Take 324 mg by mouth daily.   fluticasone  (FLONASE ) 50 MCG/ACT nasal spray Place 2 sprays into both nostrils daily.   gabapentin  (NEURONTIN ) 100 MG capsule TAKE 1 CAPSULE BY MOUTH TWICE A DAY   glucose blood (ONETOUCH ULTRA) test strip TEST BLOOD SUGARS TWICE DAILY AS NEEDED.   Lancets (BD LANCET ULTRAFINE 30G) MISC 1 application by Does not apply route in the morning and at bedtime.   losartan  (COZAAR ) 100 MG tablet Take 1 tablet (100 mg total) by mouth daily.   montelukast  (SINGULAIR ) 10 MG tablet Take 1 tablet (10 mg total) by mouth at bedtime.   nitroGLYCERIN  (NITROSTAT ) 0.4 MG SL tablet Place 1 tablet (0.4 mg total) under the tongue every 5 (five) minutes x 3 doses as needed for chest pain.   pantoprazole  (PROTONIX ) 40 MG tablet Take 1 tablet (40 mg total) by mouth daily.   rosuvastatin  (CRESTOR ) 20 MG tablet Take 1 tablet (20 mg total) by mouth daily.   zinc gluconate 50 MG tablet Take 50 mg by mouth daily.   Allergies: Patient is allergic to ace inhibitors, altace [ramipril], and lisinopril. Family History: Patient family history includes Arthritis in her mother; COPD in her father and mother; Heart attack in her brother, father, and mother. Social History:  Patient  reports that she has never smoked. She has never used smokeless tobacco. She reports that she does not drink alcohol and does not use drugs.  Review of Systems: Constitutional: Negative for fever malaise or anorexia Cardiovascular: negative for chest pain Respiratory: negative for SOB or persistent cough Gastrointestinal: negative for abdominal pain  Objective   Vitals: BP (!) 145/77   Pulse 89   Temp 97.7 F (36.5 C)   Ht 5' (1.524 m)   Wt 125 lb (56.7 kg)   LMP  (LMP Unknown)   SpO2 99%   BMI 24.41 kg/m  General: no acute distress , A&Ox3 HEENT: PEERL, conjunctiva normal, neck is supple, no cervical lymphadenopathy Right submandibular mass: approx 2-3cm, minimally tender, no erythema or warmth or fluctuance. Firm.  Commons side effects, risks, benefits, and alternatives for medications and treatment plan prescribed today were discussed, and the patient expressed understanding of the given instructions. Patient is instructed to call or message via MyChart if he/she has any questions or concerns regarding our treatment plan. No barriers to understanding were identified. We discussed Red Flag symptoms and signs in detail. Patient expressed understanding regarding what to do in case of urgent  or emergency type symptoms.  Medication list was reconciled, printed and provided to the patient in AVS. Patient instructions and summary information was reviewed with the patient as documented in the AVS. This note was prepared with assistance of Dragon voice recognition software. Occasional wrong-word or sound-a-like substitutions may have occurred due to the inherent limitations of voice recognition software

## 2024-07-21 ENCOUNTER — Ambulatory Visit: Payer: Self-pay

## 2024-07-21 NOTE — Telephone Encounter (Signed)
 Reason for Disposition . Side (flank) or lower back pain present    No appt availability for remainder of day. Patient scheduled for earliest appt for tomorrow.  Answer Assessment - Initial Assessment Questions Patient states she also runny nose with clear phlegm. She is unsure if this is the cause of the runny nose. Patient denies vaginal discharge.  1. SYMPTOM: What's the main symptom you're concerned about? (e.g., frequency, incontinence)     Itching, pain on outside, swelling on external of vagina 2. ONSET: When did the  symptoms  start?     A couple of days ago 3. PAIN: Is there any pain? If Yes, ask: How bad is it? (Scale: 1-10; mild, moderate, severe)     5 4. CAUSE: What do you think is causing the symptoms?     Patient believes it's a UTI 5. OTHER SYMPTOMS: Do you have any other symptoms? (e.g., blood in urine, fever, flank pain, pain with urination)     New back pain, fever yesterday (patient said temperature was elevated more than her baseline)  Protocols used: Urinary Symptoms-A-AH

## 2024-07-21 NOTE — Telephone Encounter (Signed)
 Reviewed

## 2024-07-21 NOTE — Telephone Encounter (Signed)
 First attempt; no answer   Copied from CRM [id number]. Topic: Clinical - Medication Question >> Jul 21, 2024 11:34 AM Mesmerise C wrote: Reason for CRM: Patient believes she had UTI and would like a antibiotic sent in  [id number] [place], [place] - [address] [place] [address] Phone: [phone] Fax: [phone]

## 2024-07-21 NOTE — Telephone Encounter (Signed)
 FYI Only or Action Required?: FYI only for provider.  Patient was last seen in primary care on 07/15/2024 by Jodie Lavern CROME, MD.  Called Nurse Triage reporting UTI.  Symptoms began several days ago.  Interventions attempted: Nothing.  Symptoms are: gradually worsening.  Triage Disposition: See HCP Within 4 Hours (Or PCP Triage)  Patient/caregiver understands and will follow disposition?: Yes    Patient states she also runny nose with clear phlegm. She is unsure if this is the cause of the runny nose. Patient denies vaginal discharge.  1. SYMPTOM: What's the main symptom you're concerned about? (e.g., frequency, incontinence)     Itching, pain on outside, swelling on external of vagina 2. ONSET: When did the  symptoms  start?     A couple of days ago 3. PAIN: Is there any pain? If Yes, ask: How bad is it? (Scale: 1-10; mild, moderate, severe)     5 4. CAUSE: What do you think is causing the symptoms?     Patient believes it's a UTI 5. OTHER SYMPTOMS: Do you have any other symptoms? (e.g., blood in urine, fever, flank pain, pain with urination)     New back pain, fever yesterday (patient said temperature was elevated more than her baseline)

## 2024-07-22 ENCOUNTER — Encounter: Payer: Self-pay | Admitting: Family

## 2024-07-22 ENCOUNTER — Other Ambulatory Visit (HOSPITAL_COMMUNITY)
Admission: RE | Admit: 2024-07-22 | Discharge: 2024-07-22 | Disposition: A | Discharge status: Home / Self Care | Source: Ambulatory Visit | Attending: Family | Admitting: Family

## 2024-07-22 ENCOUNTER — Ambulatory Visit: Admitting: Family

## 2024-07-22 VITALS — BP 143/84 | HR 89 | Temp 97.2°F | Ht 60.0 in | Wt 126.5 lb

## 2024-07-22 DIAGNOSIS — R21 Rash and other nonspecific skin eruption: Secondary | ICD-10-CM

## 2024-07-22 DIAGNOSIS — S40861A Insect bite (nonvenomous) of right upper arm, initial encounter: Secondary | ICD-10-CM

## 2024-07-22 DIAGNOSIS — N898 Other specified noninflammatory disorders of vagina: Secondary | ICD-10-CM | POA: Diagnosis present

## 2024-07-22 DIAGNOSIS — S40862A Insect bite (nonvenomous) of left upper arm, initial encounter: Secondary | ICD-10-CM

## 2024-07-22 DIAGNOSIS — S80861A Insect bite (nonvenomous), right lower leg, initial encounter: Secondary | ICD-10-CM

## 2024-07-22 DIAGNOSIS — W57XXXA Bitten or stung by nonvenomous insect and other nonvenomous arthropods, initial encounter: Secondary | ICD-10-CM

## 2024-07-22 DIAGNOSIS — S80862A Insect bite (nonvenomous), left lower leg, initial encounter: Secondary | ICD-10-CM

## 2024-07-22 LAB — POCT URINALYSIS DIPSTICK
Bilirubin, UA: NEGATIVE
Blood, UA: NEGATIVE
Glucose, UA: POSITIVE — AB
Ketones, UA: NEGATIVE
Leukocytes, UA: NEGATIVE
Nitrite, UA: NEGATIVE
Protein, UA: NEGATIVE
Spec Grav, UA: 1.015 (ref 1.010–1.025)
Urobilinogen, UA: 0.2 U/dL
pH, UA: 6 (ref 5.0–8.0)

## 2024-07-22 MED ORDER — FLUCONAZOLE 150 MG PO TABS
ORAL_TABLET | ORAL | 0 refills | Status: AC
Start: 2024-07-22 — End: ?

## 2024-07-22 MED ORDER — CLOTRIMAZOLE 1 % EX CREA: 1.0000 | TOPICAL_CREAM | Freq: Two times a day (BID) | CUTANEOUS | 0 refills | Status: AC

## 2024-07-22 MED ORDER — TRIAMCINOLONE ACETONIDE 0.1 % EX CREA
1.0000 | TOPICAL_CREAM | Freq: Two times a day (BID) | CUTANEOUS | 0 refills | Status: AC
Start: 2024-07-22 — End: 2024-07-29

## 2024-07-22 NOTE — Progress Notes (Signed)
 Patient ID: Wanda Medina, female    DOB: 28-Jun-1951, 73 y.o.   MRN: [id number]  Chief Complaint  Patient presents with   Vaginal Itching    Pt c/o vaginal itching and pain, present for 4 days. Has tried tylenol , which did help sx.   HPI: Reports vaginal itching and burning for 4 days. She denies any urinary sx or vaginal discharge or foul odor. Denies any yeast infection for many years. She is taking Farxiga  and likes to work outside in her garden. She also reports having multiple insect bites on her arms and legs which are itchy. She has tried OTC hydrocortisone cream which is not working well.  ASSESSMENT & PLAN: 1. Vaginal itching (Primary) UA neg. Sending out vaginal swab, will treat for yeast based on sx.  - POCT Urinalysis Dipstick - Cervicovaginal ancillary only - clotrimazole  (LOTRIMIN ) 1 % cream; Apply 1 Application topically 2 (two) times daily for 7 days. Apply to outside vaginal skin.  Dispense: 14 g; Refill: 0 - fluconazole  (DIFLUCAN ) 150 MG tablet; Take 1 pill today and the 2nd pill in 3 days.  Dispense: 2 tablet; Refill: 0  2. Insect bite, unspecified site, initial encounter a few small red bumps noted on bilateral arms & legs.  - triamcinolone  cream (KENALOG ) 0.1 %; Apply 1 Application topically 2 (two) times daily for 7 days. Apply to itchy skin rash.  Dispense: 15 g; Refill: 0  3. Skin rash  - triamcinolone  cream (KENALOG ) 0.1 %; Apply 1 Application topically 2 (two) times daily for 7 days. Apply to itchy skin rash.  Dispense: 15 g; Refill: 0   Subjective:    Outpatient Medications Prior to Visit  Medication Sig Dispense Refill   ASPIRIN  LOW DOSE 81 MG tablet TAKE 1 TABLET BY MOUTH DAILY. SWALLOW WHOLE. 90 tablet 3   betamethasone  dipropionate 0.05 % cream Apply topically 2 (two) times daily. For 1-2 weeks for poison ivy. Do not use on face 30 g 0   calcium -vitamin D  (OSCAL WITH D) 500-200 MG-UNIT tablet Take 1 tablet by mouth daily.      carvedilol  (COREG ) 3.125 MG tablet Take 1 tablet (3.125 mg total) by mouth 2 (two) times daily with a meal. 180 tablet 3   clopidogrel  (PLAVIX ) 75 MG tablet Take 1 tablet (75 mg total) by mouth daily. 90 tablet 3   cyclobenzaprine  (FLEXERIL ) 5 MG tablet Take 1 tablet (5 mg total) by mouth 2 (two) times daily. 180 tablet 3   dapagliflozin  propanediol (FARXIGA ) 10 MG TABS tablet TAKE 1 TABLET BY MOUTH DAILY BEFORE BREAKFAST. 90 tablet 2   Dulaglutide  (TRULICITY ) 3 MG/0.5ML SOAJ Inject 3 mg as directed once a week. 6 mL 3   ferrous sulfate 324 (65 Fe) MG TBEC Take 324 mg by mouth daily.     fluticasone  (FLONASE ) 50 MCG/ACT nasal spray Place 2 sprays into both nostrils daily. 16 g 6   gabapentin  (NEURONTIN ) 100 MG capsule TAKE 1 CAPSULE BY MOUTH TWICE A DAY 180 capsule 3   glucose blood (ONETOUCH ULTRA) test strip TEST BLOOD SUGARS TWICE DAILY AS NEEDED. 100 strip 3   Lancets (BD LANCET ULTRAFINE 30G) MISC 1 application by Does not apply route in the morning and at bedtime. 360 each 3   losartan  (COZAAR ) 100 MG tablet Take 1 tablet (100 mg total) by mouth daily. 90 tablet 3   montelukast  (SINGULAIR ) 10 MG tablet Take 1 tablet (10 mg total) by mouth at bedtime. 90 tablet 3   nitroGLYCERIN  (  NITROSTAT ) 0.4 MG SL tablet Place 1 tablet (0.4 mg total) under the tongue every 5 (five) minutes x 3 doses as needed for chest pain. 25 tablet 1   pantoprazole  (PROTONIX ) 40 MG tablet Take 1 tablet (40 mg total) by mouth daily. 30 tablet 3   rosuvastatin  (CRESTOR ) 20 MG tablet Take 1 tablet (20 mg total) by mouth daily. 90 tablet 3   zinc gluconate 50 MG tablet Take 50 mg by mouth daily.     No facility-administered medications prior to visit.   Past Medical History:  Diagnosis Date   Allergy    Arthritis    Chronic kidney disease    AKI saw Nephrologist in [place]   Diabetes mellitus without complication (HCC)    GERD (gastroesophageal reflux disease)    Hyperlipemia    Hypertension    Past Surgical  History:  Procedure Laterality Date   APPENDECTOMY     COLONOSCOPY     CORONARY/GRAFT ACUTE MI REVASCULARIZATION N/A 02/02/2023   Procedure: Coronary/Graft Acute MI Revascularization;  Surgeon: Court Dorn PARAS, MD;  Location: [facility];  Service: Cardiovascular;  Laterality: N/A;   LEFT HEART CATH AND CORONARY ANGIOGRAPHY N/A 02/02/2023   Procedure: LEFT HEART CATH AND CORONARY ANGIOGRAPHY;  Surgeon: Court Dorn PARAS, MD;  Location: [facility];  Service: Cardiovascular;  Laterality: N/A;   MEDIAL PARTIAL KNEE REPLACEMENT     Rt. knee in 05/2016   TRIGGER FINGER RELEASE     both hands   Allergies  Allergen Reactions   Ace Inhibitors     cough   Altace [Ramipril] Cough   Lisinopril Cough      Objective:    Physical Exam Vitals and nursing note reviewed.  Constitutional:      Appearance: Normal appearance.  Cardiovascular:     Rate and Rhythm: Normal rate and regular rhythm.  Pulmonary:     Effort: Pulmonary effort is normal.     Breath sounds: Normal breath sounds.  Musculoskeletal:        General: Normal range of motion.  Skin:    General: Skin is warm and dry.     Findings: Rash (a few small red bumps noted on bilateral arms & legs.) present.  Neurological:     Mental Status: She is alert.  Psychiatric:        Mood and Affect: Mood normal.        Behavior: Behavior normal.    BP (!) 143/84   Pulse 89   Temp (!) 97.2 F (36.2 C) (Temporal)   Ht 5' (1.524 m)   Wt 126 lb 8 oz (57.4 kg)   LMP  (LMP Unknown)   SpO2 98%   BMI 24.71 kg/m  Wt Readings from Last 3 Encounters:  07/22/24 126 lb 8 oz (57.4 kg)  07/15/24 125 lb (56.7 kg)  06/17/24 125 lb (56.7 kg)      Lucius Krabbe, NP

## 2024-07-23 ENCOUNTER — Ambulatory Visit: Payer: Self-pay | Admitting: Family

## 2024-07-23 LAB — CERVICOVAGINAL ANCILLARY ONLY
Bacterial Vaginitis (gardnerella): NEGATIVE
Candida Glabrata: NEGATIVE
Candida Vaginitis: POSITIVE — AB
Comment: NEGATIVE
Comment: NEGATIVE
Comment: NEGATIVE

## 2024-07-23 LAB — HM MAMMOGRAPHY

## 2024-08-12 ENCOUNTER — Other Ambulatory Visit: Payer: Self-pay | Admitting: Nurse Practitioner

## 2024-08-12 ENCOUNTER — Other Ambulatory Visit: Payer: Self-pay | Admitting: Adult Health

## 2024-08-13 ENCOUNTER — Institutional Professional Consult (permissible substitution) (INDEPENDENT_AMBULATORY_CARE_PROVIDER_SITE_OTHER): Admitting: Otolaryngology

## 2024-08-20 ENCOUNTER — Other Ambulatory Visit: Payer: Self-pay

## 2024-08-21 MED ORDER — NITROGLYCERIN 0.4 MG SL SUBL: 0.4000 mg | SUBLINGUAL_TABLET | SUBLINGUAL | 1 refills | Status: AC | PRN

## 2024-09-11 ENCOUNTER — Other Ambulatory Visit: Payer: Self-pay | Admitting: Family Medicine

## 2024-09-19 ENCOUNTER — Other Ambulatory Visit: Payer: Self-pay | Admitting: Family Medicine

## 2024-10-26 ENCOUNTER — Other Ambulatory Visit: Payer: Self-pay | Admitting: Cardiovascular Disease

## 2024-10-27 ENCOUNTER — Telehealth: Payer: Self-pay | Admitting: Cardiovascular Disease

## 2024-10-27 MED ORDER — DAPAGLIFLOZIN PROPANEDIOL 10 MG PO TABS: 10.0000 mg | ORAL_TABLET | Freq: Every day | ORAL | 2 refills | Status: AC

## 2024-10-27 NOTE — Telephone Encounter (Signed)
 Pt's medication was sent to pt's pharmacy as requested. Confirmation received.

## 2024-10-27 NOTE — Telephone Encounter (Signed)
*  STAT* If patient is at the pharmacy, call can be transferred to refill team.   1. Which medications need to be refilled? (please list name of each medication and dose if known)   dapagliflozin  propanediol (FARXIGA ) 10 MG TABS tablet    2. Which pharmacy/location (including street and city if local pharmacy) is medication to be sent to?  [phone] [place], [place] - [address]    3. Do they need a 30 day or 90 day supply? 90

## 2024-11-16 ENCOUNTER — Ambulatory Visit: Admitting: Family Medicine

## 2024-11-25 ENCOUNTER — Encounter: Payer: Self-pay | Admitting: Family Medicine

## 2024-11-25 ENCOUNTER — Ambulatory Visit: Admitting: Family Medicine

## 2024-11-25 VITALS — BP 112/78 | HR 77 | Temp 97.0°F | Ht 60.0 in | Wt 127.4 lb

## 2024-11-25 DIAGNOSIS — Z7985 Long-term (current) use of injectable non-insulin antidiabetic drugs: Secondary | ICD-10-CM | POA: Diagnosis not present

## 2024-11-25 DIAGNOSIS — E1159 Type 2 diabetes mellitus with other circulatory complications: Secondary | ICD-10-CM | POA: Diagnosis not present

## 2024-11-25 DIAGNOSIS — Z789 Other specified health status: Secondary | ICD-10-CM | POA: Insufficient documentation

## 2024-11-25 DIAGNOSIS — E1122 Type 2 diabetes mellitus with diabetic chronic kidney disease: Secondary | ICD-10-CM | POA: Diagnosis not present

## 2024-11-25 DIAGNOSIS — E1142 Type 2 diabetes mellitus with diabetic polyneuropathy: Secondary | ICD-10-CM

## 2024-11-25 DIAGNOSIS — I152 Hypertension secondary to endocrine disorders: Secondary | ICD-10-CM

## 2024-11-25 DIAGNOSIS — I252 Old myocardial infarction: Secondary | ICD-10-CM

## 2024-11-25 DIAGNOSIS — N184 Chronic kidney disease, stage 4 (severe): Secondary | ICD-10-CM

## 2024-11-25 DIAGNOSIS — K219 Gastro-esophageal reflux disease without esophagitis: Secondary | ICD-10-CM | POA: Diagnosis not present

## 2024-11-25 DIAGNOSIS — E538 Deficiency of other specified B group vitamins: Secondary | ICD-10-CM

## 2024-11-25 LAB — RENAL FUNCTION PANEL
Albumin: 4 g/dL (ref 3.5–5.2)
BUN: 31 mg/dL — ABNORMAL HIGH (ref 6–23)
CO2: 27 meq/L (ref 19–32)
Calcium: 9.4 mg/dL (ref 8.4–10.5)
Chloride: 103 meq/L (ref 96–112)
Creatinine, Ser: 1.82 mg/dL — ABNORMAL HIGH (ref 0.40–1.20)
GFR: 27.03 mL/min — ABNORMAL LOW (ref >60.00)
Glucose, Bld: 109 mg/dL — ABNORMAL HIGH (ref 70–99)
Phosphorus: 3.6 mg/dL (ref 2.3–4.6)
Potassium: 4.1 meq/L (ref 3.5–5.1)
Sodium: 138 meq/L (ref 135–145)

## 2024-11-25 LAB — VITAMIN B12: Vitamin B-12: 586 pg/mL (ref 211–911)

## 2024-11-25 LAB — HEMOGLOBIN A1C: Hgb A1c MFr Bld: 7.4 % — ABNORMAL HIGH (ref 4.6–6.5)

## 2024-11-25 NOTE — Patient Instructions (Signed)
 Please return in 6 months for your annual complete physical; please come fasting.  For follow up on chronic medical conditions   I will release your lab results to you on your MyChart account with further instructions. You may see the results before I do, but when I review them I will send you a message with my report or have my assistant call you if things need to be discussed. Please reply to my message with any questions. Thank you!   If you have any questions or concerns, please don't hesitate to send me a message via MyChart or call the office at [phone]. Thank you for visiting with us  today! It's our pleasure caring for you.    VISIT SUMMARY: During your six-month checkup, we reviewed your overall health, including your diabetes, kidney function, blood pressure, and cardiovascular status. We also discussed your recent travel, breast cancer screening, and concerns about your vitamin B12 levels.  YOUR PLAN: -TYPE 2 DIABETES MELLITUS WITH STAGE 4 CHRONIC KIDNEY DISEASE: This means you have diabetes and advanced kidney disease. Your diabetes and kidney function are well-managed, and your blood pressure is well-controlled. We rechecked your kidney function and diabetes status and scheduled a follow-up appointment in six months for a physical examination.  -HYPERTENSION ASSOCIATED WITH DIABETES: This means you have high blood pressure related to your diabetes. Your blood pressure is well-controlled.  -HISTORY OF ST ELEVATION MYOCARDIAL INFARCTION: This means you have a history of a type of heart attack. You have no current chest pain or breathing difficulties, and your overall cardiovascular status is stable.  -VITAMIN B12 DEFICIENCY, SUSPECTED: This means you may have low levels of vitamin B12, which is important for nerve function and the production of red blood cells. We checked your Vitamin B12 levels and recommended taking an over-the-counter Vitamin B12 supplement, possibly three times a  week.  -GENERAL HEALTH MAINTENANCE: Your mammogram was last performed in August 2025. We will obtain the mammogram records from your [facility] office and schedule a mammogram if it is not up to date.  INSTRUCTIONS: Please follow up in six months for a physical examination. Continue monitoring your blood pressure, diabetes, and kidney function. Start taking an over-the-counter Vitamin B12 supplement, possibly three times a week. We will obtain your mammogram records and schedule a new mammogram if needed.                      Contains text generated by [employer]bridge.                                 Contains text generated by Abridge.

## 2024-11-25 NOTE — Progress Notes (Signed)
 Subjective  CC:  Chief Complaint  Patient presents with   Diabetes    Here for a 6 month follow-up for diabetes. Still having some issues with reflux, the meds are helping though. Has fasted for labs    Follow-up    HPI: Wanda Medina is a 73 y.o. female who presents to the office today for follow up of diabetes and problems listed above in the chief complaint.  Discussed the use of AI scribe software for clinical note transcription with the patient, who gave verbal consent to proceed.  History of Present Illness Wanda Medina is a 73 year old female who presents for a six-month checkup. DM f/u: remains well controlled by sxs. On trulicity  and farxiga . Eats well. Continues to exercise. Vegetarian diet. No eye or foot concerns.  Weight is stable. Imms current. Eye exam current.   HTN and HLD have been well controlled. Reviewed meds GERD improved on protonix   Sialolithiasis (07/2024) - Previous episode of sialolithiasis with a stone obstructing the gland, resolved spontaneously - Swelling subsided without medical intervention - Managed condition by increasing water intake to flush out the stone  Recent travel - Returned from a two-week trip to [place] - Remained healthy during travel despite prior episodes of illness during travel - [place] alone, sold a condominium, participated in local shopping and temple visit - Exercised caution with activities to avoid illness  Breast cancer screening - Mammogram performed in August 2025 at a facility in [place] through [facility], Dr. Rosalynn  Vitamin b12 status - Requests evaluation of vitamin B12 levels - Complete vegetarian, does not consume meat or take B12 supplements - Previous B12 level was 1400, considered high - Expresses concern about current B12 status  General health maintenance - Feels well overall - No chest pain or breathing difficulties - Diabetes monitored as part of routine health  check-up - Blood pressure reported to be very good    Wt Readings from Last 3 Encounters:  11/25/24 127 lb 6.4 oz (57.8 kg)  07/22/24 126 lb 8 oz (57.4 kg)  07/15/24 125 lb (56.7 kg)    BP Readings from Last 3 Encounters:  11/25/24 112/78  07/22/24 (!) 143/84  07/15/24 (!) 145/77    Assessment  1. Type 2 diabetes mellitus with stage 4 chronic kidney disease, without long-term current use of insulin  (HCC)   2. Diabetic peripheral neuropathy (HCC)   3. History of ST elevation myocardial infarction (STEMI)   4. Hypertension associated with diabetes (HCC)   5. Gastroesophageal reflux disease, unspecified whether esophagitis present   6. Vitamin B12 deficiency   7. Vegetarian diet   8. Long-term (current) use of injectable non-insulin  antidiabetic drugs      Plan  Assessment and Plan Assessment & Plan Type 2 diabetes mellitus with stage 4 chronic kidney disease Diabetes and kidney function are well-managed. Blood pressure is well-controlled. No chest pain or dyspnea reported. - Rechecked kidney function and diabetes status - has been well controlled.  - imms and eye exam up to date - no foot concerns - gaba for neuropathy - Scheduled follow-up appointment in six months for physical examination  Hypertension associated with diabetes Blood pressure is well-controlled. On bb and ace  History of ST elevation myocardial infarction No current chest pain or dyspnea. Overall cardiovascular status is stable.  Vitamin B12 deficiency, suspected Suspected due to vegetarian diet. Previous B12 level was 1400, considered high. No current supplementation. - Checked Vitamin B12 levels - Recommended over-the-counter Vitamin  B12 supplement, possibly three times a week  Continue statin Continue protonix  for gerd, controlled.  Resolved sialolithiasis  General Health Maintenance Mammogram was last performed in August 2025. Follow-up with OB GYN for records is needed. - Will obtain  mammogram records from [facility] office - Schedule mammogram if not up to date  Follow up: 6 mo for cpe and chronic problem f/u Orders Placed This Encounter  Procedures   Hemoglobin A1c   Renal function panel   Vitamin B12   No orders of the defined types were placed in this encounter.     Immunization History  Administered Date(s) Administered   Fluad Quad(high Dose 65+) 10/11/2021   Fluad Trivalent(High Dose 65+) 09/03/2023   INFLUENZA, HIGH DOSE SEASONAL PF 12/18/2018, 10/17/2024   Influenza-Unspecified 11/20/2019, 09/18/2020   Moderna SARS-COV2 Booster Vaccination 10/17/2024   PFIZER(Purple Top)SARS-COV-2 Vaccination 01/23/2020, 02/17/2020, 09/18/2020, 07/16/2021   Pfizer Covid-19 Vaccine Bivalent Booster 58yrs & up 11/04/2021   Pneumococcal Conjugate-13 09/09/2016   Pneumococcal Polysaccharide-23 10/27/2018   Respiratory Syncytial Virus Vaccine,Recomb Aduvanted(Arexvy) 09/16/2023   Zoster Recombinant(Shingrix) 08/24/2019, 11/20/2019    Diabetes Related Lab Review: Lab Results  Component Value Date   HGBA1C 6.9 (A) 06/03/2024   HGBA1C 7.0 (A) 03/03/2024   HGBA1C 7.8 (H) 09/03/2023    Lab Results  Component Value Date   MICROALBUR 2.3 (H) 06/03/2024   Lab Results  Component Value Date   CREATININE 1.69 (H) 06/03/2024   BUN 31 (H) 06/03/2024   NA 139 06/03/2024   K 4.4 06/03/2024   CL 106 06/03/2024   CO2 26 06/03/2024   Lab Results  Component Value Date   CHOL 134 06/03/2024   CHOL 141 09/03/2023   CHOL 111 02/05/2023   Lab Results  Component Value Date   HDL 61.70 06/03/2024   HDL 78.40 09/03/2023   HDL 52 02/05/2023   Lab Results  Component Value Date   LDLCALC 54 06/03/2024   LDLCALC 48 09/03/2023   LDLCALC 43 02/05/2023   Lab Results  Component Value Date   TRIG 90.0 06/03/2024   TRIG 73.0 09/03/2023   TRIG 78 02/05/2023   Lab Results  Component Value Date   CHOLHDL 2 06/03/2024   CHOLHDL 2 09/03/2023   CHOLHDL 2.1 02/05/2023   No  results found for: LDLDIRECT The ASCVD Risk score (Arnett DK, et al., 2019) failed to calculate for the following reasons:   Risk score cannot be calculated because patient has a medical history suggesting prior/existing ASCVD   * - Cholesterol units were assumed I have reviewed the PMH, Fam and Soc history. 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 tobie Diabetic control  worsening 08/2023: offered lantus ; pt defers.  Farxiga  and trulicity     Hypertension associated with diabetes (HCC) 10/27/2018     Priority: High    Essential hypertension    Chronic kidney disease, stage 4 (severe) (HCC) 10/27/2018     Priority: High   Osteoarthritis of knee, unspecified 11/12/2018     Priority: Medium    Osteopenia 11/12/2018     Priority: Medium     Dexa: 04/2020. tscore of -1.3. FRAX: 1.2%/9.3%. repeat 3 years. Continue calcium /vitamin D   DEXA: 08.2024 lowest T = -1.4; ostepenia, stable. No change. Repeat in 2 years.     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 She has never used smokeless tobacco. She reports that she does not drink alcohol and does not use drugs.  Review of Systems: Ophthalmic: negative for eye pain, loss of vision or double vision Cardiovascular: negative for chest pain Respiratory: negative for SOB or persistent cough Gastrointestinal: negative for abdominal pain Genitourinary: negative for dysuria or gross hematuria MSK: negative for foot lesions Neurologic: negative for weakness or gait disturbance  Objective  Vitals: BP 112/78 (BP Location: Left Arm, Patient Position: Sitting, Cuff Size: Normal)   Pulse 77   Temp (!) 97 F (36.1 C) (Temporal)   Ht 5' (1.524 m)   Wt 127 lb 6.4 oz (57.8 kg)   LMP  (LMP Unknown)   SpO2 99%   BMI 24.88 kg/m  General: well appearing, no acute distress  Psych:  Alert and oriented, normal mood and affect HEENT:  Normocephalic, atraumatic, moist mucous membranes, supple neck , no masses Cardiovascular:  Nl S1 and S2, RRR without murmur, gallop or rub. no edema Respiratory:  Good breath sounds bilaterally, CTAB with normal effort, no rales Ext no edema   Diabetic education: ongoing education regarding chronic disease management for diabetes was given today. We continue to reinforce the ABC's of diabetic management: A1c (<7 or 8 dependent upon patient), tight blood pressure control, and cholesterol management with goal LDL < 100 minimally. We discuss diet strategies, exercise recommendations, medication options and possible side effects. At each visit, we review recommended immunizations and  preventive care recommendations for diabetics and stress that good diabetic control can prevent other problems. See below for this patient's data. Commons side effects, risks, benefits, and alternatives for medications and treatment plan prescribed today were discussed, and the patient expressed understanding of the given instructions. Patient is instructed to call or message via MyChart if he/she has any questions or concerns regarding our treatment plan. No barriers to understanding were identified. We discussed Red Flag symptoms and signs in detail. Patient expressed understanding regarding what to do in case of urgent or emergency type symptoms.  Medication list was reconciled, printed and provided to the patient in AVS. Patient instructions and summary information was reviewed with the patient as  documented in the AVS. This note was prepared with assistance of Dragon voice recognition software. Occasional wrong-word or sound-a-like substitutions may have occurred due to the inherent limitations of voice recognition software

## 2024-12-04 ENCOUNTER — Ambulatory Visit: Payer: Self-pay | Admitting: Family Medicine

## 2024-12-04 NOTE — Progress Notes (Signed)
 See mychart note Dear Ms. Patient, Your lab work shows that your diabetic control is a little worse again. I would like you to watch your diet and avoid sweets etc and return to see me in 3 months so we can recheck your levels. IF they remain high, we can adjust your medications at that time. Your kidney function is minimally worse again also. Continue seeing your kidney specialist to follow these levels. No changes are needed at this time. Please call the office to schedule another visit with me in 3 months to recheck your diabetes.   Sincerely, Dr. Jodie

## 2025-02-12 ENCOUNTER — Ambulatory Visit: Admitting: Family Medicine

## 2025-05-26 ENCOUNTER — Ambulatory Visit: Admitting: Family Medicine
